# Patient Record
Sex: Male | Born: 1960
Health system: Southern US, Community
[De-identification: ages and names within clinical notes are randomized; demographics above are authoritative.]

## PROBLEM LIST (undated history)

## (undated) DIAGNOSIS — J45909 Unspecified asthma, uncomplicated: Secondary | ICD-10-CM

## (undated) DIAGNOSIS — I10 Essential (primary) hypertension: Secondary | ICD-10-CM

## (undated) DIAGNOSIS — R0600 Dyspnea, unspecified: Secondary | ICD-10-CM

## (undated) DIAGNOSIS — N529 Male erectile dysfunction, unspecified: Secondary | ICD-10-CM

## (undated) DIAGNOSIS — R079 Chest pain, unspecified: Secondary | ICD-10-CM

## (undated) DIAGNOSIS — K219 Gastro-esophageal reflux disease without esophagitis: Secondary | ICD-10-CM

## (undated) DIAGNOSIS — I4510 Unspecified right bundle-branch block: Secondary | ICD-10-CM

## (undated) DIAGNOSIS — R7303 Prediabetes: Secondary | ICD-10-CM

## (undated) DIAGNOSIS — Z87820 Personal history of traumatic brain injury: Secondary | ICD-10-CM

## (undated) DIAGNOSIS — M2550 Pain in unspecified joint: Secondary | ICD-10-CM

## (undated) DIAGNOSIS — I444 Left anterior fascicular block: Secondary | ICD-10-CM

## (undated) DIAGNOSIS — J309 Allergic rhinitis, unspecified: Secondary | ICD-10-CM

## (undated) DIAGNOSIS — F419 Anxiety disorder, unspecified: Secondary | ICD-10-CM

## (undated) DIAGNOSIS — G4730 Sleep apnea, unspecified: Secondary | ICD-10-CM

## (undated) DIAGNOSIS — E785 Hyperlipidemia, unspecified: Secondary | ICD-10-CM

## (undated) DIAGNOSIS — M1990 Unspecified osteoarthritis, unspecified site: Secondary | ICD-10-CM

## (undated) DIAGNOSIS — M549 Dorsalgia, unspecified: Secondary | ICD-10-CM

## (undated) DIAGNOSIS — I440 Atrioventricular block, first degree: Secondary | ICD-10-CM

## (undated) DIAGNOSIS — Z8659 Personal history of other mental and behavioral disorders: Secondary | ICD-10-CM

## (undated) DIAGNOSIS — Z87898 Personal history of other specified conditions: Secondary | ICD-10-CM

## (undated) DIAGNOSIS — C679 Malignant neoplasm of bladder, unspecified: Secondary | ICD-10-CM

## (undated) HISTORY — DX: Prediabetes: R73.03

## (undated) HISTORY — DX: Pain in unspecified joint: M25.50

## (undated) HISTORY — DX: Dorsalgia, unspecified: M54.9

## (undated) HISTORY — DX: Unspecified asthma, uncomplicated: J45.909

## (undated) HISTORY — DX: Chest pain, unspecified: R07.9

## (undated) HISTORY — DX: Dyspnea, unspecified: R06.00

## (undated) HISTORY — PX: TONSILLECTOMY AND ADENOIDECTOMY: SUR1326

## (undated) HISTORY — DX: Sleep apnea, unspecified: G47.30

## (undated) HISTORY — PX: OTHER SURGICAL HISTORY: SHX169

---

## 2009-03-18 ENCOUNTER — Encounter: Admission: RE | Admit: 2009-03-18 | Discharge: 2009-03-18 | Payer: Self-pay | Admitting: Orthopedic Surgery

## 2009-07-22 ENCOUNTER — Emergency Department (HOSPITAL_COMMUNITY): Admission: EM | Admit: 2009-07-22 | Discharge: 2009-07-22 | Payer: Self-pay | Admitting: Emergency Medicine

## 2010-07-28 ENCOUNTER — Emergency Department (HOSPITAL_COMMUNITY)
Admission: EM | Admit: 2010-07-28 | Discharge: 2010-07-29 | Disposition: A | Discharge status: Home / Self Care | Payer: BC Managed Care – PPO | Attending: Emergency Medicine | Admitting: Emergency Medicine

## 2010-07-28 DIAGNOSIS — R339 Retention of urine, unspecified: Secondary | ICD-10-CM | POA: Insufficient documentation

## 2010-07-28 DIAGNOSIS — S20229A Contusion of unspecified back wall of thorax, initial encounter: Secondary | ICD-10-CM | POA: Insufficient documentation

## 2010-07-28 DIAGNOSIS — Y929 Unspecified place or not applicable: Secondary | ICD-10-CM | POA: Insufficient documentation

## 2010-07-28 DIAGNOSIS — N390 Urinary tract infection, site not specified: Secondary | ICD-10-CM | POA: Insufficient documentation

## 2010-07-29 LAB — URINALYSIS, ROUTINE W REFLEX MICROSCOPIC
Leukocytes, UA: NEGATIVE
pH: 6 (ref 5.0–8.0)

## 2010-07-29 LAB — POCT I-STAT, CHEM 8
BUN: 18 mg/dL (ref 6–23)
Calcium, Ion: 1.14 mmol/L (ref 1.12–1.32)
Chloride: 108 mEq/L (ref 96–112)
Creatinine, Ser: 1 mg/dL (ref 0.4–1.5)
Glucose, Bld: 133 mg/dL — ABNORMAL HIGH (ref 70–99)
HCT: 44 % (ref 39.0–52.0)
Hemoglobin: 15 g/dL (ref 13.0–17.0)
TCO2: 23 mmol/L (ref 0–100)

## 2010-07-30 LAB — URINE CULTURE
Colony Count: NO GROWTH
Culture  Setup Time: 201203152222

## 2010-08-09 LAB — URINALYSIS, ROUTINE W REFLEX MICROSCOPIC
Glucose, UA: NEGATIVE mg/dL
Nitrite: NEGATIVE
Protein, ur: 100 mg/dL — AB
pH: 5 (ref 5.0–8.0)

## 2010-08-09 LAB — HEPATIC FUNCTION PANEL
Albumin: 4.8 g/dL (ref 3.5–5.2)
Alkaline Phosphatase: 71 U/L (ref 39–117)
Bilirubin, Direct: 0.2 mg/dL (ref 0.0–0.3)
Indirect Bilirubin: 0.6 mg/dL (ref 0.3–0.9)
Total Bilirubin: 0.8 mg/dL (ref 0.3–1.2)
Total Protein: 8.3 g/dL (ref 6.0–8.3)

## 2010-08-09 LAB — LIPASE, BLOOD: Lipase: 33 U/L (ref 11–59)

## 2010-08-09 LAB — DIFFERENTIAL
Basophils Absolute: 0 10*3/uL (ref 0.0–0.1)
Monocytes Relative: 7 % (ref 3–12)
Neutro Abs: 5.3 10*3/uL (ref 1.7–7.7)
Neutrophils Relative %: 65 % (ref 43–77)

## 2010-08-09 LAB — CBC
HCT: 42.5 % (ref 39.0–52.0)
MCV: 95.3 fL (ref 78.0–100.0)
Platelets: 175 10*3/uL (ref 150–400)
WBC: 8.2 10*3/uL (ref 4.0–10.5)

## 2010-08-09 LAB — POCT I-STAT, CHEM 8
BUN: 16 mg/dL (ref 6–23)
Calcium, Ion: 1.21 mmol/L (ref 1.12–1.32)
Potassium: 4.1 mEq/L (ref 3.5–5.1)
Sodium: 141 mEq/L (ref 135–145)

## 2010-08-09 LAB — URINE MICROSCOPIC-ADD ON

## 2011-09-19 ENCOUNTER — Inpatient Hospital Stay (HOSPITAL_COMMUNITY): Payer: BC Managed Care – PPO | Admitting: Certified Registered"

## 2011-09-19 ENCOUNTER — Inpatient Hospital Stay (HOSPITAL_COMMUNITY)
Admission: EM | Admit: 2011-09-19 | Discharge: 2011-09-22 | DRG: 331 | Disposition: A | Discharge status: Home / Self Care | Payer: BC Managed Care – PPO | Attending: General Surgery | Admitting: General Surgery

## 2011-09-19 ENCOUNTER — Emergency Department (HOSPITAL_COMMUNITY): Payer: BC Managed Care – PPO

## 2011-09-19 ENCOUNTER — Inpatient Hospital Stay (HOSPITAL_COMMUNITY): Payer: BC Managed Care – PPO

## 2011-09-19 ENCOUNTER — Encounter (HOSPITAL_COMMUNITY): Payer: Self-pay | Admitting: *Deleted

## 2011-09-19 ENCOUNTER — Encounter (HOSPITAL_COMMUNITY): Admission: EM | Disposition: A | Discharge status: Home / Self Care | Payer: Self-pay | Source: Home / Self Care

## 2011-09-19 ENCOUNTER — Encounter (HOSPITAL_COMMUNITY): Payer: Self-pay | Admitting: Certified Registered"

## 2011-09-19 DIAGNOSIS — Z96659 Presence of unspecified artificial knee joint: Secondary | ICD-10-CM

## 2011-09-19 DIAGNOSIS — K929 Disease of digestive system, unspecified: Secondary | ICD-10-CM | POA: Diagnosis not present

## 2011-09-19 DIAGNOSIS — K219 Gastro-esophageal reflux disease without esophagitis: Secondary | ICD-10-CM | POA: Diagnosis present

## 2011-09-19 DIAGNOSIS — S3720XA Unspecified injury of bladder, initial encounter: Principal | ICD-10-CM | POA: Diagnosis present

## 2011-09-19 DIAGNOSIS — S058X9A Other injuries of unspecified eye and orbit, initial encounter: Secondary | ICD-10-CM | POA: Diagnosis present

## 2011-09-19 DIAGNOSIS — L719 Rosacea, unspecified: Secondary | ICD-10-CM | POA: Diagnosis present

## 2011-09-19 DIAGNOSIS — F10229 Alcohol dependence with intoxication, unspecified: Secondary | ICD-10-CM | POA: Diagnosis present

## 2011-09-19 DIAGNOSIS — S0510XA Contusion of eyeball and orbital tissues, unspecified eye, initial encounter: Secondary | ICD-10-CM | POA: Diagnosis present

## 2011-09-19 DIAGNOSIS — R059 Cough, unspecified: Secondary | ICD-10-CM | POA: Diagnosis not present

## 2011-09-19 DIAGNOSIS — S2239XA Fracture of one rib, unspecified side, initial encounter for closed fracture: Secondary | ICD-10-CM

## 2011-09-19 DIAGNOSIS — S2232XA Fracture of one rib, left side, initial encounter for closed fracture: Secondary | ICD-10-CM | POA: Diagnosis present

## 2011-09-19 DIAGNOSIS — S3729XA Other injury of bladder, initial encounter: Secondary | ICD-10-CM | POA: Diagnosis present

## 2011-09-19 DIAGNOSIS — R05 Cough: Secondary | ICD-10-CM | POA: Diagnosis not present

## 2011-09-19 DIAGNOSIS — S0230XA Fracture of orbital floor, unspecified side, initial encounter for closed fracture: Secondary | ICD-10-CM | POA: Diagnosis present

## 2011-09-19 DIAGNOSIS — S0285XA Fracture of orbit, unspecified, initial encounter for closed fracture: Secondary | ICD-10-CM | POA: Diagnosis present

## 2011-09-19 DIAGNOSIS — F1010 Alcohol abuse, uncomplicated: Secondary | ICD-10-CM | POA: Diagnosis present

## 2011-09-19 DIAGNOSIS — K560 Paralytic ileus: Secondary | ICD-10-CM | POA: Diagnosis not present

## 2011-09-19 DIAGNOSIS — S3730XA Unspecified injury of urethra, initial encounter: Principal | ICD-10-CM | POA: Diagnosis present

## 2011-09-19 DIAGNOSIS — E785 Hyperlipidemia, unspecified: Secondary | ICD-10-CM | POA: Diagnosis present

## 2011-09-19 DIAGNOSIS — D62 Acute posthemorrhagic anemia: Secondary | ICD-10-CM | POA: Diagnosis not present

## 2011-09-19 DIAGNOSIS — N3289 Other specified disorders of bladder: Secondary | ICD-10-CM

## 2011-09-19 DIAGNOSIS — S060X9A Concussion with loss of consciousness of unspecified duration, initial encounter: Secondary | ICD-10-CM

## 2011-09-19 DIAGNOSIS — J45909 Unspecified asthma, uncomplicated: Secondary | ICD-10-CM | POA: Diagnosis present

## 2011-09-19 DIAGNOSIS — S0292XA Unspecified fracture of facial bones, initial encounter for closed fracture: Secondary | ICD-10-CM

## 2011-09-19 HISTORY — PX: CYSTOSCOPY: SHX5120

## 2011-09-19 LAB — POCT I-STAT, CHEM 8
Calcium, Ion: 1.16 mmol/L (ref 1.12–1.32)
Chloride: 103 mEq/L (ref 96–112)
Creatinine, Ser: 1.5 mg/dL — ABNORMAL HIGH (ref 0.50–1.35)
Hemoglobin: 17.3 g/dL — ABNORMAL HIGH (ref 13.0–17.0)

## 2011-09-19 LAB — URINE MICROSCOPIC-ADD ON

## 2011-09-19 LAB — URINALYSIS, ROUTINE W REFLEX MICROSCOPIC
Protein, ur: 30 mg/dL — AB
Specific Gravity, Urine: 1.045 — ABNORMAL HIGH (ref 1.005–1.030)
pH: 5 (ref 5.0–8.0)

## 2011-09-19 LAB — ETHANOL: Alcohol, Ethyl (B): 188 mg/dL — ABNORMAL HIGH (ref 0–11)

## 2011-09-19 LAB — MRSA PCR SCREENING: MRSA by PCR: NEGATIVE

## 2011-09-19 SURGERY — CYSTOSCOPY
Anesthesia: General | Site: Bladder | Wound class: Clean Contaminated

## 2011-09-19 MED ORDER — LORAZEPAM 2 MG/ML IJ SOLN: 1.0000 mg | Freq: Four times a day (QID) | INTRAMUSCULAR | Status: AC | PRN

## 2011-09-19 MED ORDER — IOHEXOL 300 MG/ML  SOLN
100.0000 mL | Freq: Once | INTRAMUSCULAR | Status: AC | PRN
  Administered 2011-09-19: 100 mL via INTRAVENOUS

## 2011-09-19 MED ORDER — GLYCOPYRROLATE 0.2 MG/ML IJ SOLN
INTRAMUSCULAR | Status: DC | PRN
  Administered 2011-09-19: .8 mg via INTRAVENOUS

## 2011-09-19 MED ORDER — SUCCINYLCHOLINE CHLORIDE 20 MG/ML IJ SOLN
INTRAMUSCULAR | Status: DC | PRN
  Administered 2011-09-19: 120 mg via INTRAVENOUS

## 2011-09-19 MED ORDER — MORPHINE SULFATE (PF) 1 MG/ML IV SOLN
INTRAVENOUS | Status: DC
  Administered 2011-09-19: 22.5 mg via INTRAVENOUS
  Administered 2011-09-19 (×2): via INTRAVENOUS
  Administered 2011-09-20: 31 mg via INTRAVENOUS
  Administered 2011-09-20 (×3): via INTRAVENOUS
  Administered 2011-09-20: 6 mg via INTRAVENOUS
  Administered 2011-09-21: 16 mg via INTRAVENOUS
  Administered 2011-09-21: 16.4 mg via INTRAVENOUS
  Administered 2011-09-21: 06:00:00 via INTRAVENOUS
  Filled 2011-09-19 (×7): qty 25

## 2011-09-19 MED ORDER — LORAZEPAM 1 MG PO TABS: 0.0000 mg | ORAL_TABLET | Freq: Two times a day (BID) | ORAL | Status: DC

## 2011-09-19 MED ORDER — CEFAZOLIN SODIUM-DEXTROSE 2-3 GM-% IV SOLR
2.0000 g | Freq: Three times a day (TID) | INTRAVENOUS | Status: DC
  Administered 2011-09-19 – 2011-09-21 (×4): 2 g via INTRAVENOUS
  Filled 2011-09-19 (×8): qty 50

## 2011-09-19 MED ORDER — PHENYLEPHRINE HCL 10 MG/ML IJ SOLN
INTRAMUSCULAR | Status: DC | PRN
  Administered 2011-09-19: 80 ug via INTRAVENOUS
  Administered 2011-09-19 (×2): 40 ug via INTRAVENOUS
  Administered 2011-09-19 (×3): 80 ug via INTRAVENOUS

## 2011-09-19 MED ORDER — SUFENTANIL CITRATE 50 MCG/ML IV SOLN
INTRAVENOUS | Status: DC | PRN
  Administered 2011-09-19: 15 ug via INTRAVENOUS
  Administered 2011-09-19: 10 ug via INTRAVENOUS
  Administered 2011-09-19 (×3): 5 ug via INTRAVENOUS
  Administered 2011-09-19: 10 ug via INTRAVENOUS

## 2011-09-19 MED ORDER — FOLIC ACID 1 MG PO TABS
1.0000 mg | ORAL_TABLET | Freq: Every day | ORAL | Status: DC
  Administered 2011-09-19 – 2011-09-22 (×4): 1 mg via ORAL
  Filled 2011-09-19 (×4): qty 1

## 2011-09-19 MED ORDER — LIDOCAINE HCL (CARDIAC) 20 MG/ML IV SOLN
INTRAVENOUS | Status: DC | PRN
  Administered 2011-09-19: 40 mg via INTRAVENOUS

## 2011-09-19 MED ORDER — LACTATED RINGERS IV SOLN
INTRAVENOUS | Status: DC | PRN
  Administered 2011-09-19 (×4): via INTRAVENOUS

## 2011-09-19 MED ORDER — MIDAZOLAM HCL 5 MG/5ML IJ SOLN
INTRAMUSCULAR | Status: DC | PRN
  Administered 2011-09-19: 2 mg via INTRAVENOUS

## 2011-09-19 MED ORDER — LORAZEPAM 1 MG PO TABS
1.0000 mg | ORAL_TABLET | Freq: Four times a day (QID) | ORAL | Status: AC | PRN
  Filled 2011-09-19: qty 1

## 2011-09-19 MED ORDER — THIAMINE HCL 100 MG/ML IJ SOLN
100.0000 mg | Freq: Every day | INTRAMUSCULAR | Status: DC
  Filled 2011-09-19 (×3): qty 1

## 2011-09-19 MED ORDER — FENTANYL CITRATE 0.05 MG/ML IJ SOLN
100.0000 ug | Freq: Once | INTRAMUSCULAR | Status: AC
  Administered 2011-09-19: 100 ug via INTRAVENOUS

## 2011-09-19 MED ORDER — HYDROMORPHONE HCL PF 1 MG/ML IJ SOLN
0.2500 mg | INTRAMUSCULAR | Status: DC | PRN
  Administered 2011-09-19: 0.5 mg via INTRAVENOUS

## 2011-09-19 MED ORDER — HETASTARCH-ELECTROLYTES 6 % IV SOLN
INTRAVENOUS | Status: DC | PRN
  Administered 2011-09-19: 16:00:00 via INTRAVENOUS

## 2011-09-19 MED ORDER — DIPHENHYDRAMINE HCL 50 MG/ML IJ SOLN: 12.5000 mg | Freq: Four times a day (QID) | INTRAMUSCULAR | Status: DC | PRN

## 2011-09-19 MED ORDER — ONDANSETRON HCL 4 MG/2ML IJ SOLN: 4.0000 mg | Freq: Four times a day (QID) | INTRAMUSCULAR | Status: DC | PRN

## 2011-09-19 MED ORDER — ADULT MULTIVITAMIN W/MINERALS CH
1.0000 | ORAL_TABLET | Freq: Every day | ORAL | Status: DC
  Administered 2011-09-19 – 2011-09-22 (×4): 1 via ORAL
  Filled 2011-09-19 (×4): qty 1

## 2011-09-19 MED ORDER — ROCURONIUM BROMIDE 100 MG/10ML IV SOLN
INTRAVENOUS | Status: DC | PRN
  Administered 2011-09-19: 20 mg via INTRAVENOUS
  Administered 2011-09-19: 10 mg via INTRAVENOUS
  Administered 2011-09-19: 50 mg via INTRAVENOUS
  Administered 2011-09-19: 10 mg via INTRAVENOUS

## 2011-09-19 MED ORDER — POTASSIUM CHLORIDE IN NACL 20-0.45 MEQ/L-% IV SOLN
INTRAVENOUS | Status: DC
  Administered 2011-09-19: 125 mL via INTRAVENOUS
  Administered 2011-09-20 (×2): via INTRAVENOUS
  Filled 2011-09-19 (×9): qty 1000

## 2011-09-19 MED ORDER — FENTANYL CITRATE 0.05 MG/ML IJ SOLN
100.0000 ug | Freq: Once | INTRAMUSCULAR | Status: AC
  Administered 2011-09-19: 100 ug via INTRAVENOUS
  Filled 2011-09-19: qty 2

## 2011-09-19 MED ORDER — ONDANSETRON HCL 4 MG/2ML IJ SOLN
INTRAMUSCULAR | Status: DC | PRN
  Administered 2011-09-19: 4 mg via INTRAVENOUS

## 2011-09-19 MED ORDER — PANTOPRAZOLE SODIUM 40 MG PO TBEC
40.0000 mg | DELAYED_RELEASE_TABLET | Freq: Every day | ORAL | Status: DC
  Administered 2011-09-19 – 2011-09-22 (×4): 40 mg via ORAL
  Filled 2011-09-19 (×4): qty 1

## 2011-09-19 MED ORDER — HYDROMORPHONE HCL PF 1 MG/ML IJ SOLN
0.5000 mg | INTRAMUSCULAR | Status: DC | PRN
  Filled 2011-09-19: qty 1

## 2011-09-19 MED ORDER — PROPOFOL 10 MG/ML IV EMUL
INTRAVENOUS | Status: DC | PRN
  Administered 2011-09-19: 200 mg via INTRAVENOUS

## 2011-09-19 MED ORDER — SODIUM CHLORIDE 0.9 % IJ SOLN: 9.0000 mL | INTRAMUSCULAR | Status: DC | PRN

## 2011-09-19 MED ORDER — VITAMIN B-1 100 MG PO TABS
100.0000 mg | ORAL_TABLET | Freq: Every day | ORAL | Status: DC
  Administered 2011-09-19 – 2011-09-22 (×4): 100 mg via ORAL
  Filled 2011-09-19 (×4): qty 1

## 2011-09-19 MED ORDER — MORPHINE SULFATE 4 MG/ML IJ SOLN: 0.0500 mg/kg | INTRAMUSCULAR | Status: DC | PRN

## 2011-09-19 MED ORDER — LORAZEPAM 1 MG PO TABS
0.0000 mg | ORAL_TABLET | Freq: Four times a day (QID) | ORAL | Status: AC
  Administered 2011-09-20: 1 mg via ORAL

## 2011-09-19 MED ORDER — SODIUM CHLORIDE 0.9 % IR SOLN
Status: DC | PRN
  Administered 2011-09-19: 3000 mL

## 2011-09-19 MED ORDER — 0.9 % SODIUM CHLORIDE (POUR BTL) OPTIME
TOPICAL | Status: DC | PRN
  Administered 2011-09-19: 1000 mL

## 2011-09-19 MED ORDER — FENTANYL CITRATE 0.05 MG/ML IJ SOLN
INTRAMUSCULAR | Status: AC
  Filled 2011-09-19: qty 2

## 2011-09-19 MED ORDER — EPHEDRINE SULFATE 50 MG/ML IJ SOLN
INTRAMUSCULAR | Status: DC | PRN
  Administered 2011-09-19: 5 mg via INTRAVENOUS
  Administered 2011-09-19: 10 mg via INTRAVENOUS
  Administered 2011-09-19: 5 mg via INTRAVENOUS

## 2011-09-19 MED ORDER — ONDANSETRON HCL 4 MG/2ML IJ SOLN: 4.0000 mg | Freq: Once | INTRAMUSCULAR | Status: DC | PRN

## 2011-09-19 MED ORDER — DOCUSATE SODIUM 100 MG PO CAPS
100.0000 mg | ORAL_CAPSULE | Freq: Two times a day (BID) | ORAL | Status: DC
  Administered 2011-09-19 – 2011-09-22 (×6): 100 mg via ORAL
  Filled 2011-09-19 (×6): qty 1

## 2011-09-19 MED ORDER — PANTOPRAZOLE SODIUM 40 MG IV SOLR
40.0000 mg | Freq: Every day | INTRAVENOUS | Status: DC
  Filled 2011-09-19 (×2): qty 40

## 2011-09-19 MED ORDER — LEVOFLOXACIN IN D5W 500 MG/100ML IV SOLN
500.0000 mg | INTRAVENOUS | Status: AC
  Administered 2011-09-19: 500 mg via INTRAVENOUS
  Filled 2011-09-19 (×2): qty 100

## 2011-09-19 MED ORDER — NEOSTIGMINE METHYLSULFATE 1 MG/ML IJ SOLN
INTRAMUSCULAR | Status: DC | PRN
  Administered 2011-09-19: 5 mg via INTRAVENOUS

## 2011-09-19 MED ORDER — NALOXONE HCL 0.4 MG/ML IJ SOLN: 0.4000 mg | INTRAMUSCULAR | Status: DC | PRN

## 2011-09-19 MED ORDER — CEFAZOLIN SODIUM 1-5 GM-% IV SOLN
INTRAVENOUS | Status: DC | PRN
  Administered 2011-09-19: 2 g via INTRAVENOUS

## 2011-09-19 MED ORDER — DIPHENHYDRAMINE HCL 12.5 MG/5ML PO ELIX
12.5000 mg | ORAL_SOLUTION | Freq: Four times a day (QID) | ORAL | Status: DC | PRN
  Filled 2011-09-19: qty 5

## 2011-09-19 MED ORDER — ALBUTEROL SULFATE HFA 108 (90 BASE) MCG/ACT IN AERS
2.0000 | INHALATION_SPRAY | RESPIRATORY_TRACT | Status: DC | PRN
  Administered 2011-09-21: 2 via RESPIRATORY_TRACT
  Filled 2011-09-19: qty 6.7

## 2011-09-19 SURGICAL SUPPLY — 50 items
BAG URINE DRAINAGE (UROLOGICAL SUPPLIES) ×3 IMPLANT
BAG URINE LEG 500ML (DRAIN) ×2 IMPLANT
BAG URO CATCHER STRL LF (DRAPE) ×2 IMPLANT
BLADE SURG 15 STRL LF DISP TIS (BLADE) ×1 IMPLANT
BLADE SURG 15 STRL SS (BLADE) ×2
CATH FOLEY 2WAY SLVR  5CC 18FR (CATHETERS) ×1
CATH FOLEY 2WAY SLVR  5CC 20FR (CATHETERS) ×2
CATH FOLEY 2WAY SLVR 5CC 18FR (CATHETERS) IMPLANT
CATH FOLEY 2WAY SLVR 5CC 20FR (CATHETERS) IMPLANT
CATH MUSHROOM 22FR (CATHETERS) ×1 IMPLANT
CATH ROBINSON RED A/P 18FR (CATHETERS) ×1 IMPLANT
CLOTH BEACON ORANGE TIMEOUT ST (SAFETY) ×3 IMPLANT
COVER SURGICAL LIGHT HANDLE (MISCELLANEOUS) ×4 IMPLANT
DRAIN CHANNEL 19F RND (DRAIN) ×1 IMPLANT
DRAPE CAMERA CLOSED 9X96 (DRAPES) ×2 IMPLANT
DRAPE LAPAROSCOPIC ABDOMINAL (DRAPES) ×1 IMPLANT
DRSG TEGADERM 4X4.75 (GAUZE/BANDAGES/DRESSINGS) ×2 IMPLANT
ELECT REM PT RETURN 9FT ADLT (ELECTROSURGICAL) ×2
ELECTRODE REM PT RTRN 9FT ADLT (ELECTROSURGICAL) ×1 IMPLANT
EVACUATOR SILICONE 100CC (DRAIN) ×1 IMPLANT
GLOVE BIOGEL PI IND STRL 7.5 (GLOVE) ×1 IMPLANT
GLOVE BIOGEL PI INDICATOR 7.5 (GLOVE) ×1
GLOVE ECLIPSE 7.5 STRL STRAW (GLOVE) ×2 IMPLANT
GLOVE SURG SS PI 8.0 STRL IVOR (GLOVE) ×1 IMPLANT
GOWN PREVENTION PLUS XLARGE (GOWN DISPOSABLE) ×2 IMPLANT
GOWN STRL NON-REIN LRG LVL3 (GOWN DISPOSABLE) ×2 IMPLANT
GOWN STRL REIN XL XLG (GOWN DISPOSABLE) ×4 IMPLANT
KIT SUPRAPUBIC CATH (MISCELLANEOUS) ×1 IMPLANT
MANIFOLD NEPTUNE II (INSTRUMENTS) ×2 IMPLANT
NEEDLE HYPO 22GX1.5 SAFETY (NEEDLE) IMPLANT
NS IRRIG 1000ML POUR BTL (IV SOLUTION) ×2 IMPLANT
PACK CYSTO (CUSTOM PROCEDURE TRAY) ×2 IMPLANT
PENCIL BUTTON HOLSTER BLD 10FT (ELECTRODE) IMPLANT
PLUG CATH AND CAP STER (CATHETERS) IMPLANT
SPONGE GAUZE 4X4 12PLY (GAUZE/BANDAGES/DRESSINGS) ×1 IMPLANT
STAPLER VISISTAT 35W (STAPLE) ×1 IMPLANT
SUT CHROMIC 2 0 UR 5 27 (SUTURE) ×2 IMPLANT
SUT ETHILON 3 0 FSL (SUTURE) ×2 IMPLANT
SUT PDS AB 1 CTX 36 (SUTURE) ×2 IMPLANT
SUT SILK 2 0 (SUTURE) ×2
SUT SILK 2 0 FS (SUTURE) ×2 IMPLANT
SUT SILK 2-0 18XBRD TIE 12 (SUTURE) ×1 IMPLANT
SUT VIC AB 2-0 UR5 27 (SUTURE) ×8 IMPLANT
SUT VIC AB 3-0 SH 27 (SUTURE) ×2
SUT VIC AB 3-0 SH 27XBRD (SUTURE) IMPLANT
SYRINGE IRR TOOMEY STRL 70CC (SYRINGE) ×1 IMPLANT
TAPE CLOTH SURG 6X10 WHT LF (GAUZE/BANDAGES/DRESSINGS) ×1 IMPLANT
TOWEL OR 17X26 10 PK STRL BLUE (TOWEL DISPOSABLE) ×2 IMPLANT
TUBE CONNECTING 12X1/4 (SUCTIONS) ×2 IMPLANT
WATER STERILE IRR 3000ML UROMA (IV SOLUTION) ×2 IMPLANT
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
## 2011-09-19 NOTE — ED Notes (Signed)
MD at bedside. 
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
## 2011-09-19 NOTE — Brief Op Note (Signed)
09/19/2011  7:04 PM  PATIENT:  Christian [name]  51 y.o. male  PRE-OPERATIVE DIAGNOSIS:  Intraperitoneal bladder rupture  POST-OPERATIVE DIAGNOSIS:  same  PROCEDURE:  Procedure(s) (LRB): CYSTOSCOPY (N/A) INSERTION OF SUPRAPUBIC CATHETER (N/A) Open repair of bladder rupture.  SURGEON:  Surgeon(s) and Role:    * Milford Cage, MD - Primary  PHYSICIAN ASSISTANT:   ASSISTANTS: none   ANESTHESIA:   general  EBL:   20cc  BLOOD ADMINISTERED:none  DRAINS: (1) Jackson-Pratt drain(s) with closed bulb suction in the RLQ, Urinary Catheter (Foley) and Urinary Catheter (Suprapubic)   LOCAL MEDICATIONS USED:  NONE  SPECIMEN:  No Specimen  DISPOSITION OF SPECIMEN:  N/A  COUNTS:  YES  TOURNIQUET:  * No tourniquets in log *  DICTATION: .Other Dictation: Dictation Number [id number]  PLAN OF CARE: Admit to inpatient   PATIENT DISPOSITION:  PACU - hemodynamically stable.   Delay start of Pharmacological VTE agent (>24hrs) due to surgical blood loss or risk of bleeding: no
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
## 2011-09-19 NOTE — ED Notes (Signed)
Nothing acute seen on bedside ultrasound by Dr. Radford Pax.  LSB removed with c-spine maintained..  Pt remains in c-collar.

## 2011-09-19 NOTE — Transfer of Care (Signed)
Immediate Anesthesia Transfer of Care Note  Patient: Christian [name]  Procedure(s) Performed: Procedure(s) (LRB): CYSTOSCOPY (N/A) INSERTION OF SUPRAPUBIC CATHETER (N/A)  Patient Location: PACU  Anesthesia Type: General  Level of Consciousness: awake, alert , oriented and patient cooperative  Airway & Oxygen Therapy: Patient Spontanous Breathing and Patient connected to face mask oxygen  Post-op Assessment: Report given to PACU RN and Post -op Vital signs reviewed and stable  Post vital signs: Reviewed and stable  Complications: No apparent anesthesia complications

## 2011-09-19 NOTE — Anesthesia Preprocedure Evaluation (Addendum)
Anesthesia Evaluation  Patient identified by MRN, date of birth, ID band Patient awake    Reviewed: Allergy & Precautions, H&P , NPO status , Patient's Chart, lab work & pertinent test results, reviewed documented beta blocker date and time   Airway Mallampati: I TM Distance: >3 FB     Dental  (+) Teeth Intact   Pulmonary asthma ,          Cardiovascular     Neuro/Psych    GI/Hepatic GERD-  Controlled,  Endo/Other    Renal/GU      Musculoskeletal   Abdominal   Peds  Hematology   Anesthesia Other Findings   Reproductive/Obstetrics                           Anesthesia Physical Anesthesia Plan  ASA: II  Anesthesia Plan: General   Post-op Pain Management:    Induction: Intravenous, Rapid sequence and Cricoid pressure planned  Airway Management Planned: Oral ETT  Additional Equipment:   Intra-op Plan:   Post-operative Plan: Extubation in OR  Informed Consent: I have reviewed the patients History and Physical, chart, labs and discussed the procedure including the risks, benefits and alternatives for the proposed anesthesia with the patient or authorized representative who has indicated his/her understanding and acceptance.   Dental advisory given  Plan Discussed with: CRNA and Surgeon  Anesthesia Plan Comments:        Anesthesia Quick Evaluation

## 2011-09-19 NOTE — Anesthesia Postprocedure Evaluation (Signed)
  Anesthesia Post-op Note  Patient: Christian [name]  Procedure(s) Performed: Procedure(s) (LRB): CYSTOSCOPY (N/A) INSERTION OF SUPRAPUBIC CATHETER (N/A)  Patient Location: PACU  Anesthesia Type: General  Level of Consciousness: awake  Airway and Oxygen Therapy: Patient Spontanous Breathing  Post-op Pain: mild  Post-op Assessment: Post-op Vital signs reviewed, Patient's Cardiovascular Status Stable, Respiratory Function Stable, Patent Airway, No signs of Nausea or vomiting and Pain level controlled  Post-op Vital Signs: stable  Complications: No apparent anesthesia complications
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
## 2011-09-19 NOTE — Preoperative (Signed)
Beta Blockers   Reason not to administer Beta Blockers:Not Applicable 

## 2011-09-19 NOTE — Anesthesia Procedure Notes (Signed)
Procedure Name: Intubation Date/Time: 09/19/2011 3:24 PM Performed by: Glendora Score A Pre-anesthesia Checklist: Patient identified, Emergency Drugs available, Suction available and Patient being monitored Patient Re-evaluated:Patient Re-evaluated prior to inductionOxygen Delivery Method: Circle system utilized Preoxygenation: Pre-oxygenation with 100% oxygen Intubation Type: IV induction Ventilation: Mask ventilation without difficulty and Oral airway inserted - appropriate to patient size Grade View: Grade I Tube type: Oral Tube size: 8.0 mm Number of attempts: 1 Airway Equipment and Method: Stylet and Video-laryngoscopy Placement Confirmation: ETT inserted through vocal cords under direct vision,  positive ETCO2 and breath sounds checked- equal and bilateral Secured at: 24 cm Tube secured with: Tape Dental Injury: Teeth and Oropharynx as per pre-operative assessment  Comments: Modified RSI

## 2011-09-19 NOTE — H&P (Signed)
Intraperitoneal bladder rupture  This patient has been seen and I agree with the findings and treatment plan.  Marta Lamas. Gae Bon, MD, FACS [phone] (pager) [phone] (direct pager) Trauma Surgeon

## 2011-09-20 ENCOUNTER — Encounter (HOSPITAL_COMMUNITY): Payer: Self-pay | Admitting: *Deleted

## 2011-09-20 ENCOUNTER — Inpatient Hospital Stay (HOSPITAL_COMMUNITY): Payer: BC Managed Care – PPO

## 2011-09-20 LAB — CBC
MCH: 32.4 pg (ref 26.0–34.0)
MCV: 95.8 fL (ref 78.0–100.0)
Platelets: 125 10*3/uL — ABNORMAL LOW (ref 150–400)
RBC: 4.01 MIL/uL — ABNORMAL LOW (ref 4.22–5.81)

## 2011-09-20 LAB — BASIC METABOLIC PANEL
BUN: 9 mg/dL (ref 6–23)
CO2: 28 mEq/L (ref 19–32)
Calcium: 8.5 mg/dL (ref 8.4–10.5)
Creatinine, Ser: 0.85 mg/dL (ref 0.50–1.35)
Glucose, Bld: 96 mg/dL (ref 70–99)

## 2011-09-20 MED ORDER — ENOXAPARIN SODIUM 40 MG/0.4ML ~~LOC~~ SOLN
40.0000 mg | SUBCUTANEOUS | Status: DC
  Administered 2011-09-20 – 2011-09-21 (×2): 40 mg via SUBCUTANEOUS
  Filled 2011-09-20 (×3): qty 0.4

## 2011-09-20 MED ORDER — IPRATROPIUM-ALBUTEROL 18-103 MCG/ACT IN AERO
2.0000 | INHALATION_SPRAY | Freq: Four times a day (QID) | RESPIRATORY_TRACT | Status: DC | PRN
  Administered 2011-09-21: 2 via RESPIRATORY_TRACT
  Filled 2011-09-20: qty 14.7
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
## 2011-09-20 NOTE — Progress Notes (Signed)
09/20/2011 9:08 AM  Christian [name] [id number]    Return for physical examination and completion of history.  Hx:  Pt denies visual difficulty or any diplopia.  No pain with ROM eyes.  No hearing diff.  No malocclusion or pain with jaw motion.  PE:  Stocky middle aged wm.  Alert and appropriate.  Some RIGHT peri-orbital edema and ecchmosis.  Pupils miotic, probably secondary to narcotics.  EOM intact OU.  No gross dysconjugate gaze.  No subjective diplopia.  Bony facial contours intact and non-tender.  Mild facial excoriations.  Apparent mild anterior open bite which he claims is his baseline.  IMPRESSION:  Medial RIGHT orbital blow out fx.  PLAN:  No repair required.  Ice. Elevation.  Analgesia. Antibiosis.  Ophth consult.  No nose blowing x 2 weeks.  Recheck my office 1 wk.  See my note from yesterday.  Flo Shanks

## 2011-09-20 NOTE — Progress Notes (Signed)
UR complete 

## 2011-09-20 NOTE — Progress Notes (Signed)
Urology Progress Note  Subjective:     No acute urologic events overnight. Catheters draining well. Patient complains of abdominal pain which is decreased compared to yesterday.     ROS: Negative: SOB  Objective:  Patient Vitals for the past 24 hrs:  BP Temp Temp src Pulse Resp SpO2  09/20/11 0600 122/65 mmHg - - 85  20  -  09/20/11 0500 114/56 mmHg - - 77  15  100 %  09/20/11 0400 121/59 mmHg 97.6 F (36.4 C) Oral 81  16  100 %  09/20/11 0300 122/61 mmHg - - 82  14  100 %  09/20/11 0200 115/56 mmHg - - 80  13  100 %  09/20/11 0100 124/56 mmHg - - 93  16  100 %  09/20/11 0000 129/52 mmHg 98.2 F (36.8 C) Oral 99  15  99 %  09/19/11 2300 111/52 mmHg - - 104  17  92 %  09/19/11 2230 117/81 mmHg - - 106  20  93 %  09/19/11 2200 156/72 mmHg - - 102  21  94 %  09/19/11 2130 165/74 mmHg - - 101  25  98 %  09/19/11 2100 175/89 mmHg - - 100  24  100 %  09/19/11 1953 - 97.6 F (36.4 C) - 84  22  100 %  09/19/11 1952 - - - 85  21  100 %  09/19/11 1951 - - - 86  21  100 %  09/19/11 1950 - - - 86  23  100 %  09/19/11 1931 - - - 80  21  100 %  09/19/11 1930 - - - 82  21  100 %  09/19/11 1929 - - - 82  21  100 %  09/19/11 1928 - - - 85  21  100 %  09/19/11 1927 - - - 84  19  100 %  09/19/11 1926 - - - 85  21  100 %  09/19/11 1925 - - - 93  20  100 %  09/19/11 1924 - - - 91  19  100 %  09/19/11 1923 - - - 83  16  100 %  09/19/11 1922 - - - 89  19  100 %  09/19/11 1921 - - - 85  18  100 %  09/19/11 1920 - - - 91  17  100 %  09/19/11 1919 - - - 95  13  100 %  09/19/11 1918 - - - 92  11  100 %  09/19/11 1917 169/89 mmHg - - 92  19  100 %  09/19/11 1916 - - - - 21  -  09/19/11 1915 - 98.3 F (36.8 C) - - - -  09/19/11 1300 142/78 mmHg - - 82  - 100 %  09/19/11 1059 159/110 mmHg 97.5 F (36.4 C) Oral 68  18  100 %  09/19/11 0858 144/77 mmHg - - 76  18  98 %  09/19/11 0853 144/77 mmHg - - 78  18  100 %    Physical Exam: General:  No acute distress, awake Cardiovascular:    [x]    S1/S2 present, RRR  []   Irregularly irregular Chest:  CTA-B Abdomen:               []  Soft, appropriately TTP  []  Soft, NTTP  [x]  Soft, appropriately TTP, incision(s) dressed, JP with serosanguinous fluid  Genitourinary: Foley and SP tube in place draining clear pink urine.  I/O last 3 completed shifts: In: 3500 [I.V.:3000; IV Piggyback:500] Out: 530 [Urine:510; Blood:20]  Output since surgery: Foley: 815cc SP: 370cc JP: 80cc  Recent Labs  Basename 09/20/11 0442 09/19/11 0931   HGB 13.0 17.3*   WBC 9.3 --   PLT 125* --    Recent Labs  Basename 09/20/11 0442 09/19/11 0931   NA 137 140   K 4.1 4.4   CL 102 103   CO2 28 --   BUN 9 7   CREATININE 0.85 1.50*   CALCIUM 8.5 --   GFRNONAA >90 --   GFRAA >90 --     No results found for this basename: PT:2,INR:2,APTT:2 in the last 72 hours   No components found with this basename: ABG:2    Length of stay: 1 days.  Assessment: Intraperitoneal bladder rupture POD#1 open cystorraphy with SP tube placement. Plan: -Continue SP & foley catheter to drainage. Continue JP drain. -Drained 1 liter of fluid out of peritoneal cavity yesterday through cystotomy. He may develop peritonitis due to the urine that was in his abdomen; will keep an eye out for that. -Will likely need to be discharged home with SP tube & foley in place. -Will follow. No indication for further surgery at this time.   Natalia Leatherwood, MD [phone]
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
## 2011-09-21 ENCOUNTER — Inpatient Hospital Stay (HOSPITAL_COMMUNITY): Payer: BC Managed Care – PPO

## 2011-09-21 DIAGNOSIS — S058X9A Other injuries of unspecified eye and orbit, initial encounter: Secondary | ICD-10-CM | POA: Diagnosis present

## 2011-09-21 LAB — CBC
HCT: 36.7 % — ABNORMAL LOW (ref 39.0–52.0)
MCH: 32.1 pg (ref 26.0–34.0)
MCHC: 33.2 g/dL (ref 30.0–36.0)
MCV: 96.6 fL (ref 78.0–100.0)
RDW: 13.4 % (ref 11.5–15.5)

## 2011-09-21 LAB — BASIC METABOLIC PANEL
BUN: 6 mg/dL (ref 6–23)
Calcium: 8.7 mg/dL (ref 8.4–10.5)
Creatinine, Ser: 0.7 mg/dL (ref 0.50–1.35)
GFR calc Af Amer: >90 mL/min (ref >90)
GFR calc non Af Amer: >90 mL/min (ref >90)

## 2011-09-21 LAB — EXPECTORATED SPUTUM ASSESSMENT W GRAM STAIN, RFLX TO RESP C

## 2011-09-21 MED ORDER — OXYCODONE HCL 5 MG PO TABS
5.0000 mg | ORAL_TABLET | ORAL | Status: DC | PRN
  Administered 2011-09-21 – 2011-09-22 (×6): 15 mg via ORAL
  Filled 2011-09-21 (×4): qty 3
  Filled 2011-09-21: qty 2
  Filled 2011-09-21: qty 1
  Filled 2011-09-21: qty 3

## 2011-09-21 MED ORDER — ENOXAPARIN SODIUM 30 MG/0.3ML ~~LOC~~ SOLN
30.0000 mg | Freq: Two times a day (BID) | SUBCUTANEOUS | Status: DC
  Administered 2011-09-21 – 2011-09-22 (×2): 30 mg via SUBCUTANEOUS
  Filled 2011-09-21 (×3): qty 0.3

## 2011-09-21 MED ORDER — HYDROMORPHONE HCL PF 1 MG/ML IJ SOLN: 0.5000 mg | INTRAMUSCULAR | Status: DC | PRN

## 2011-09-21 MED ORDER — NAPROXEN 500 MG PO TABS
500.0000 mg | ORAL_TABLET | Freq: Two times a day (BID) | ORAL | Status: DC
  Administered 2011-09-21 – 2011-09-22 (×2): 500 mg via ORAL
  Filled 2011-09-21 (×4): qty 1

## 2011-09-21 MED ORDER — TRAMADOL HCL 50 MG PO TABS
100.0000 mg | ORAL_TABLET | Freq: Four times a day (QID) | ORAL | Status: DC
  Administered 2011-09-21 – 2011-09-22 (×4): 100 mg via ORAL
  Filled 2011-09-21: qty 2
  Filled 2011-09-21: qty 1
  Filled 2011-09-21 (×5): qty 2

## 2011-09-21 MED ORDER — AMOXICILLIN-POT CLAVULANATE 875-125 MG PO TABS
1.0000 | ORAL_TABLET | Freq: Two times a day (BID) | ORAL | Status: DC
  Administered 2011-09-21 – 2011-09-22 (×3): 1 via ORAL
  Filled 2011-09-21 (×4): qty 1

## 2011-09-21 NOTE — Progress Notes (Signed)
Stable.  Off PCA now.  This patient has been seen and I agree with the findings and treatment plan.  Marta Lamas. Gae Bon, MD, FACS [phone] (pager) [phone] (direct pager) Trauma Surgeon
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
## 2011-09-22 MED ORDER — AMOXICILLIN-POT CLAVULANATE 875-125 MG PO TABS: 1.0000 | ORAL_TABLET | Freq: Two times a day (BID) | ORAL | Status: AC

## 2011-09-22 MED ORDER — TRAMADOL HCL 50 MG PO TABS: 100.0000 mg | ORAL_TABLET | Freq: Four times a day (QID) | ORAL | Status: AC

## 2011-09-22 MED ORDER — NAPROXEN 500 MG PO TABS: 500.0000 mg | ORAL_TABLET | Freq: Two times a day (BID) | ORAL | Status: DC

## 2011-09-22 MED ORDER — OXYCODONE-ACETAMINOPHEN 7.5-325 MG PO TABS: 1.0000 | ORAL_TABLET | ORAL | Status: AC | PRN

## 2011-09-22 NOTE — Progress Notes (Signed)
UR complete 
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
## 2011-09-22 NOTE — Discharge Summary (Signed)
Jayro Mcmath, MD, MPH, FACS Pager: [phone]  

## 2011-09-22 NOTE — Progress Notes (Signed)
Tolerated lunch, I suspect will be ready to D/C this PM Patient examined and I agree with the assessment and plan  Violeta Gelinas, MD, MPH, FACS Pager: [phone]  09/22/2011 1:32 PM
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
## 2011-09-22 NOTE — Progress Notes (Signed)
Discharge instructions/Med Rec Sheet reviewed w/ pt. Pt expressed understanding and copies given w/ prescriptions. Pt d/c'd in stable condition via w/c, accompanied by this RN 

## 2011-09-22 NOTE — Discharge Instructions (Signed)
Wash wounds in shower with soap and water daily. Do not soak.  Record output from drain and from catheters daily.  Avoid heavy lifting until cleared by urology.  No driving while on pain medicine.  No nose blowing until cleared by Dr. Lazarus Salines.
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
## 2011-09-24 LAB — CULTURE, RESPIRATORY W GRAM STAIN: Culture: NORMAL

## 2011-09-30 ENCOUNTER — Telehealth: Payer: Self-pay | Admitting: Orthopedic Surgery

## 2011-09-30 MED ORDER — HYDROCODONE-ACETAMINOPHEN 10-325 MG PO TABS: 1.0000 | ORAL_TABLET | ORAL | Status: AC | PRN

## 2011-09-30 NOTE — Telephone Encounter (Signed)
Wife called to get refill on percocet. Patient still having incisional pain as well as chest wall pain. I told her it would be better if Dr. Margarita Grizzle was prescribing this as he is still following the patient but I would give him enough equivalent Norco for the weekend. I asked her to call Dr. Hilario Quarry office today so they could get him another rx on Monday if needed.

## 2011-11-11 ENCOUNTER — Other Ambulatory Visit: Payer: Self-pay | Admitting: Urology

## 2011-11-24 ENCOUNTER — Encounter (HOSPITAL_BASED_OUTPATIENT_CLINIC_OR_DEPARTMENT_OTHER): Payer: Self-pay | Admitting: *Deleted

## 2011-11-24 NOTE — Progress Notes (Signed)
NPO AFTER MN WITH EXCEPTION WATER/ GATORADE UNTIL 0700. ARRIVES AT 1145. NEEDS HG AND EKG. WILL BRING INHALER AND MAY TAKE TRAMADOL IF NEEDED.

## 2011-11-30 ENCOUNTER — Emergency Department (HOSPITAL_COMMUNITY): Payer: BC Managed Care – PPO

## 2011-11-30 ENCOUNTER — Encounter (HOSPITAL_BASED_OUTPATIENT_CLINIC_OR_DEPARTMENT_OTHER): Payer: Self-pay | Admitting: Anesthesiology

## 2011-11-30 ENCOUNTER — Encounter (HOSPITAL_COMMUNITY): Payer: Self-pay | Admitting: Emergency Medicine

## 2011-11-30 ENCOUNTER — Encounter (HOSPITAL_BASED_OUTPATIENT_CLINIC_OR_DEPARTMENT_OTHER): Admission: RE | Payer: Self-pay | Source: Ambulatory Visit

## 2011-11-30 ENCOUNTER — Emergency Department (HOSPITAL_COMMUNITY)
Admission: EM | Admit: 2011-11-30 | Discharge: 2011-11-30 | Disposition: A | Discharge status: Home / Self Care | Payer: BC Managed Care – PPO | Attending: Emergency Medicine | Admitting: Emergency Medicine

## 2011-11-30 ENCOUNTER — Ambulatory Visit (HOSPITAL_BASED_OUTPATIENT_CLINIC_OR_DEPARTMENT_OTHER): Admission: RE | Admit: 2011-11-30 | Payer: BC Managed Care – PPO | Source: Ambulatory Visit | Admitting: Urology

## 2011-11-30 DIAGNOSIS — S2241XA Multiple fractures of ribs, right side, initial encounter for closed fracture: Secondary | ICD-10-CM

## 2011-11-30 DIAGNOSIS — J45909 Unspecified asthma, uncomplicated: Secondary | ICD-10-CM | POA: Insufficient documentation

## 2011-11-30 DIAGNOSIS — W19XXXA Unspecified fall, initial encounter: Secondary | ICD-10-CM

## 2011-11-30 DIAGNOSIS — W1809XA Striking against other object with subsequent fall, initial encounter: Secondary | ICD-10-CM | POA: Insufficient documentation

## 2011-11-30 DIAGNOSIS — Z7982 Long term (current) use of aspirin: Secondary | ICD-10-CM | POA: Insufficient documentation

## 2011-11-30 DIAGNOSIS — K219 Gastro-esophageal reflux disease without esophagitis: Secondary | ICD-10-CM | POA: Insufficient documentation

## 2011-11-30 DIAGNOSIS — S2249XA Multiple fractures of ribs, unspecified side, initial encounter for closed fracture: Secondary | ICD-10-CM | POA: Insufficient documentation

## 2011-11-30 DIAGNOSIS — T310 Burns involving less than 10% of body surface: Secondary | ICD-10-CM

## 2011-11-30 DIAGNOSIS — R1011 Right upper quadrant pain: Secondary | ICD-10-CM | POA: Insufficient documentation

## 2011-11-30 DIAGNOSIS — IMO0002 Reserved for concepts with insufficient information to code with codable children: Secondary | ICD-10-CM | POA: Insufficient documentation

## 2011-11-30 DIAGNOSIS — Z96659 Presence of unspecified artificial knee joint: Secondary | ICD-10-CM | POA: Insufficient documentation

## 2011-11-30 DIAGNOSIS — R0602 Shortness of breath: Secondary | ICD-10-CM | POA: Insufficient documentation

## 2011-11-30 DIAGNOSIS — Y92009 Unspecified place in unspecified non-institutional (private) residence as the place of occurrence of the external cause: Secondary | ICD-10-CM | POA: Insufficient documentation

## 2011-11-30 HISTORY — DX: Unspecified asthma, uncomplicated: J45.909

## 2011-11-30 HISTORY — DX: Personal history of traumatic brain injury: Z87.820

## 2011-11-30 HISTORY — DX: Allergic rhinitis, unspecified: J30.9

## 2011-11-30 LAB — POCT I-STAT, CHEM 8
BUN: 7 mg/dL (ref 6–23)
Chloride: 105 mEq/L (ref 96–112)
Potassium: 3.9 mEq/L (ref 3.5–5.1)
Sodium: 142 mEq/L (ref 135–145)

## 2011-11-30 LAB — CBC WITH DIFFERENTIAL/PLATELET
Basophils Absolute: 0 10*3/uL (ref 0.0–0.1)
HCT: 45.3 % (ref 39.0–52.0)
Lymphocytes Relative: 14 % (ref 12–46)
Monocytes Absolute: 0.6 10*3/uL (ref 0.1–1.0)
Neutro Abs: 7.9 10*3/uL — ABNORMAL HIGH (ref 1.7–7.7)
Neutrophils Relative %: 80 % — ABNORMAL HIGH (ref 43–77)
Platelets: 188 10*3/uL (ref 150–400)
RDW: 14.2 % (ref 11.5–15.5)
WBC: 9.9 10*3/uL (ref 4.0–10.5)

## 2011-11-30 SURGERY — CYSTOSCOPY, WITH RETROGRADE PYELOGRAM
Anesthesia: General | Laterality: Bilateral

## 2011-11-30 MED ORDER — HYDROMORPHONE HCL PF 1 MG/ML IJ SOLN
1.0000 mg | Freq: Once | INTRAMUSCULAR | Status: AC
  Administered 2011-11-30: 1 mg via INTRAVENOUS
  Filled 2011-11-30: qty 1

## 2011-11-30 MED ORDER — ONDANSETRON HCL 4 MG/2ML IJ SOLN
4.0000 mg | Freq: Once | INTRAMUSCULAR | Status: AC
  Administered 2011-11-30: 4 mg via INTRAVENOUS
  Filled 2011-11-30: qty 2

## 2011-11-30 MED ORDER — OXYCODONE-ACETAMINOPHEN 5-325 MG PO TABS
2.0000 | ORAL_TABLET | Freq: Once | ORAL | Status: AC
  Administered 2011-11-30: 2 via ORAL
  Filled 2011-11-30: qty 2

## 2011-11-30 MED ORDER — SILVER SULFADIAZINE 1 % EX CREA
TOPICAL_CREAM | Freq: Once | CUTANEOUS | Status: AC
  Administered 2011-11-30: 15:00:00 via TOPICAL
  Filled 2011-11-30: qty 50

## 2011-11-30 MED ORDER — IOHEXOL 300 MG/ML  SOLN
100.0000 mL | Freq: Once | INTRAMUSCULAR | Status: AC | PRN
  Administered 2011-11-30: 100 mL via INTRAVENOUS

## 2011-11-30 MED ORDER — OXYCODONE-ACETAMINOPHEN 5-325 MG PO TABS: 1.0000 | ORAL_TABLET | Freq: Four times a day (QID) | ORAL | Status: AC | PRN
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
## 2011-12-01 ENCOUNTER — Other Ambulatory Visit: Payer: Self-pay | Admitting: Urology

## 2011-12-01 NOTE — ED Provider Notes (Signed)
Medical screening examination/treatment/procedure(s) were performed by non-physician practitioner and as supervising physician I was immediately available for consultation/collaboration.   Celene Kras, MD 12/01/11 [phone]
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
## 2011-12-12 ENCOUNTER — Encounter (HOSPITAL_BASED_OUTPATIENT_CLINIC_OR_DEPARTMENT_OTHER): Payer: Self-pay | Admitting: *Deleted
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
## 2011-12-14 ENCOUNTER — Ambulatory Visit (HOSPITAL_BASED_OUTPATIENT_CLINIC_OR_DEPARTMENT_OTHER)
Admission: RE | Admit: 2011-12-14 | Discharge: 2011-12-14 | Disposition: A | Discharge status: Home / Self Care | Payer: BC Managed Care – PPO | Source: Ambulatory Visit | Attending: Urology | Admitting: Urology

## 2011-12-14 ENCOUNTER — Ambulatory Visit (HOSPITAL_BASED_OUTPATIENT_CLINIC_OR_DEPARTMENT_OTHER): Payer: BC Managed Care – PPO | Admitting: Anesthesiology

## 2011-12-14 ENCOUNTER — Encounter (HOSPITAL_BASED_OUTPATIENT_CLINIC_OR_DEPARTMENT_OTHER)
Admission: RE | Disposition: A | Discharge status: Home / Self Care | Payer: Self-pay | Source: Ambulatory Visit | Attending: Urology

## 2011-12-14 ENCOUNTER — Encounter (HOSPITAL_BASED_OUTPATIENT_CLINIC_OR_DEPARTMENT_OTHER): Payer: Self-pay | Admitting: *Deleted

## 2011-12-14 ENCOUNTER — Encounter (HOSPITAL_BASED_OUTPATIENT_CLINIC_OR_DEPARTMENT_OTHER): Payer: Self-pay | Admitting: Anesthesiology

## 2011-12-14 DIAGNOSIS — D414 Neoplasm of uncertain behavior of bladder: Secondary | ICD-10-CM | POA: Insufficient documentation

## 2011-12-14 DIAGNOSIS — Z125 Encounter for screening for malignant neoplasm of prostate: Secondary | ICD-10-CM | POA: Insufficient documentation

## 2011-12-14 DIAGNOSIS — K219 Gastro-esophageal reflux disease without esophagitis: Secondary | ICD-10-CM | POA: Insufficient documentation

## 2011-12-14 DIAGNOSIS — J45909 Unspecified asthma, uncomplicated: Secondary | ICD-10-CM | POA: Insufficient documentation

## 2011-12-14 HISTORY — PX: CYSTOSCOPY W/ RETROGRADES: SHX1426

## 2011-12-14 HISTORY — PX: CYSTOSCOPY WITH BIOPSY: SHX5122

## 2011-12-14 LAB — POCT HEMOGLOBIN-HEMACUE: Hemoglobin: 15.2 g/dL (ref 13.0–17.0)

## 2011-12-14 SURGERY — CYSTOSCOPY, WITH RETROGRADE PYELOGRAM
Anesthesia: General | Site: Ureter | Wound class: Clean Contaminated

## 2011-12-14 MED ORDER — ONDANSETRON HCL 4 MG/2ML IJ SOLN
INTRAMUSCULAR | Status: DC | PRN
  Administered 2011-12-14: 4 mg via INTRAVENOUS

## 2011-12-14 MED ORDER — IOHEXOL 350 MG/ML SOLN
INTRAVENOUS | Status: DC | PRN
  Administered 2011-12-14: 17 mL

## 2011-12-14 MED ORDER — HYOSCYAMINE SULFATE 0.125 MG PO TABS: 0.1250 mg | ORAL_TABLET | ORAL | Status: DC | PRN

## 2011-12-14 MED ORDER — FENTANYL CITRATE 0.05 MG/ML IJ SOLN
INTRAMUSCULAR | Status: DC | PRN
  Administered 2011-12-14 (×3): 50 ug via INTRAVENOUS

## 2011-12-14 MED ORDER — HYDROCODONE-ACETAMINOPHEN 5-325 MG PO TABS: 1.0000 | ORAL_TABLET | ORAL | Status: AC | PRN

## 2011-12-14 MED ORDER — CEPHALEXIN 500 MG PO CAPS: 500.0000 mg | ORAL_CAPSULE | Freq: Three times a day (TID) | ORAL | Status: AC

## 2011-12-14 MED ORDER — BELLADONNA ALKALOIDS-OPIUM 16.2-60 MG RE SUPP
RECTAL | Status: DC | PRN
  Administered 2011-12-14: 1 via RECTAL

## 2011-12-14 MED ORDER — LACTATED RINGERS IV SOLN: INTRAVENOUS | Status: DC

## 2011-12-14 MED ORDER — PROPOFOL 10 MG/ML IV EMUL
INTRAVENOUS | Status: DC | PRN
  Administered 2011-12-14: 250 mg via INTRAVENOUS

## 2011-12-14 MED ORDER — FENTANYL CITRATE 0.05 MG/ML IJ SOLN: 25.0000 ug | INTRAMUSCULAR | Status: DC | PRN

## 2011-12-14 MED ORDER — CEFAZOLIN SODIUM-DEXTROSE 2-3 GM-% IV SOLR
2.0000 g | INTRAVENOUS | Status: AC
  Administered 2011-12-14: 2 g via INTRAVENOUS

## 2011-12-14 MED ORDER — CEFAZOLIN SODIUM 1-5 GM-% IV SOLN: 1.0000 g | INTRAVENOUS | Status: DC

## 2011-12-14 MED ORDER — GLYCOPYRROLATE 0.2 MG/ML IJ SOLN
INTRAMUSCULAR | Status: DC | PRN
  Administered 2011-12-14: 0.2 mg via INTRAVENOUS

## 2011-12-14 MED ORDER — LACTATED RINGERS IV SOLN
INTRAVENOUS | Status: DC
  Administered 2011-12-14 (×2): via INTRAVENOUS

## 2011-12-14 MED ORDER — STERILE WATER FOR IRRIGATION IR SOLN
Status: DC | PRN
  Administered 2011-12-14: 3000 mL

## 2011-12-14 MED ORDER — DEXAMETHASONE SODIUM PHOSPHATE 4 MG/ML IJ SOLN
INTRAMUSCULAR | Status: DC | PRN
  Administered 2011-12-14: 10 mg via INTRAVENOUS

## 2011-12-14 MED ORDER — LIDOCAINE HCL (CARDIAC) 20 MG/ML IV SOLN
INTRAVENOUS | Status: DC | PRN
  Administered 2011-12-14: 80 mg via INTRAVENOUS

## 2011-12-14 MED ORDER — PHENAZOPYRIDINE HCL 100 MG PO TABS: 100.0000 mg | ORAL_TABLET | Freq: Three times a day (TID) | ORAL | Status: AC | PRN

## 2011-12-14 SURGICAL SUPPLY — 40 items
ADAPTER CATH URET PLST 4-6FR (CATHETERS) IMPLANT
ADPR CATH URET STRL DISP 4-6FR (CATHETERS)
BAG DRAIN URO-CYSTO SKYTR STRL (DRAIN) ×3 IMPLANT
BAG DRN UROCATH (DRAIN) ×2
BASKET LASER NITINOL 1.9FR (BASKET) IMPLANT
BASKET STNLS GEMINI 4WIRE 3FR (BASKET) IMPLANT
BASKET ZERO TIP NITINOL 2.4FR (BASKET) IMPLANT
BRUSH URET BIOPSY 3F (UROLOGICAL SUPPLIES) IMPLANT
BSKT STON RTRVL 120 1.9FR (BASKET)
BSKT STON RTRVL GEM 120X11 3FR (BASKET)
BSKT STON RTRVL ZERO TP 2.4FR (BASKET)
CANISTER SUCT LVC 12 LTR MEDI- (MISCELLANEOUS) ×1 IMPLANT
CATH INTERMIT  6FR 70CM (CATHETERS) ×2 IMPLANT
CATH URET 5FR 28IN CONE TIP (BALLOONS)
CATH URET 5FR 28IN OPEN ENDED (CATHETERS) IMPLANT
CATH URET 5FR 70CM CONE TIP (BALLOONS) IMPLANT
CLOTH BEACON ORANGE TIMEOUT ST (SAFETY) ×3 IMPLANT
DRAPE CAMERA CLOSED 9X96 (DRAPES) ×3 IMPLANT
ELECT REM PT RETURN 9FT ADLT (ELECTROSURGICAL) ×3
ELECTRODE REM PT RTRN 9FT ADLT (ELECTROSURGICAL) ×1 IMPLANT
GLOVE BIOGEL PI IND STRL 7.0 (GLOVE) ×1 IMPLANT
GLOVE BIOGEL PI INDICATOR 7.0 (GLOVE) ×1
GLOVE ECLIPSE 7.0 STRL STRAW (GLOVE) ×6 IMPLANT
GLOVE INDICATOR 7.5 STRL GRN (GLOVE) ×3 IMPLANT
GOWN PREVENTION PLUS LG XLONG (DISPOSABLE) ×3 IMPLANT
GOWN STRL NON-REIN LRG LVL3 (GOWN DISPOSABLE) ×1 IMPLANT
GUIDEWIRE 0.038 PTFE COATED (WIRE) IMPLANT
GUIDEWIRE ANG ZIPWIRE 038X150 (WIRE) IMPLANT
GUIDEWIRE STR DUAL SENSOR (WIRE) ×3 IMPLANT
IV NS IRRIG 3000ML ARTHROMATIC (IV SOLUTION) ×2 IMPLANT
KIT BALLIN UROMAX 15FX10 (LABEL) IMPLANT
KIT BALLN UROMAX 15FX4 (MISCELLANEOUS) IMPLANT
KIT BALLN UROMAX 26 75X4 (MISCELLANEOUS)
LASER FIBER DISP (UROLOGICAL SUPPLIES) IMPLANT
PACK CYSTOSCOPY (CUSTOM PROCEDURE TRAY) ×3 IMPLANT
SET HIGH PRES BAL DIL (LABEL)
SHEATH URET ACCESS 12FR/35CM (UROLOGICAL SUPPLIES) IMPLANT
SHEATH URET ACCESS 12FR/55CM (UROLOGICAL SUPPLIES) IMPLANT
SYRINGE IRR TOOMEY STRL 70CC (SYRINGE) IMPLANT
WATER STERILE IRR 3000ML UROMA (IV SOLUTION) ×3 IMPLANT

## 2011-12-14 NOTE — Anesthesia Postprocedure Evaluation (Signed)
  Anesthesia Post-op Note  Patient: Christian [name]  Procedure(s) Performed: Procedure(s) (LRB): CYSTOSCOPY WITH RETROGRADE PYELOGRAM (Bilateral) CYSTOSCOPY WITH BIOPSY (N/A)  Patient Location: PACU  Anesthesia Type: General  Level of Consciousness: awake and alert   Airway and Oxygen Therapy: Patient Spontanous Breathing  Post-op Pain: mild  Post-op Assessment: Post-op Vital signs reviewed, Patient's Cardiovascular Status Stable, Respiratory Function Stable, Patent Airway and No signs of Nausea or vomiting  Post-op Vital Signs: stable  Complications: No apparent anesthesia complications

## 2011-12-14 NOTE — Anesthesia Preprocedure Evaluation (Signed)
Anesthesia Evaluation  Patient identified by MRN, date of birth, ID band Patient awake    Reviewed: Allergy & Precautions, H&P , NPO status , Patient's Chart, lab work & pertinent test results  Airway Mallampati: II TM Distance: >3 FB Neck ROM: full    Dental No notable dental hx. (+) Teeth Intact and Dental Advisory Given   Pulmonary neg pulmonary ROS, asthma ,  Mild asthma.  Hx. Recent rib fractures. breath sounds clear to auscultation  Pulmonary exam normal       Cardiovascular Exercise Tolerance: Good negative cardio ROS  Rhythm:regular Rate:Normal     Neuro/Psych negative neurological ROS  negative psych ROS   GI/Hepatic negative GI ROS, Neg liver ROS, (+)     substance abuse  alcohol use,   Endo/Other  negative endocrine ROS  Renal/GU negative Renal ROS  negative genitourinary   Musculoskeletal   Abdominal   Peds  Hematology negative hematology ROS (+)   Anesthesia Other Findings   Reproductive/Obstetrics negative OB ROS                           Anesthesia Physical Anesthesia Plan  ASA: II  Anesthesia Plan: General   Post-op Pain Management:    Induction: Intravenous  Airway Management Planned: LMA  Additional Equipment:   Intra-op Plan:   Post-operative Plan:   Informed Consent: I have reviewed the patients History and Physical, chart, labs and discussed the procedure including the risks, benefits and alternatives for the proposed anesthesia with the patient or authorized representative who has indicated his/her understanding and acceptance.   Dental Advisory Given  Plan Discussed with: CRNA and Surgeon  Anesthesia Plan Comments:         Anesthesia Quick Evaluation
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
## 2011-12-14 NOTE — Anesthesia Procedure Notes (Signed)
Procedure Name: LMA Insertion Date/Time: 12/14/2011 11:36 AM Performed by: Fran Lowes Pre-anesthesia Checklist: Patient identified, Emergency Drugs available, Suction available and Patient being monitored Patient Re-evaluated:Patient Re-evaluated prior to inductionOxygen Delivery Method: Circle System Utilized Preoxygenation: Pre-oxygenation with 100% oxygen Intubation Type: IV induction Ventilation: Mask ventilation without difficulty LMA: LMA inserted LMA Size: 4.0 Number of attempts: 1 Airway Equipment and Method: bite block Placement Confirmation: positive ETCO2 Tube secured with: Tape Dental Injury: Teeth and Oropharynx as per pre-operative assessment

## 2011-12-14 NOTE — Brief Op Note (Signed)
12/14/2011  12:04 PM  PATIENT:  Christian [name]  51 y.o. male  PRE-OPERATIVE DIAGNOSIS:  Bladder Lesion  POST-OPERATIVE DIAGNOSIS:  Bladder Lesion  PROCEDURE:  Procedure(s) (LRB): CYSTOSCOPY WITH RETROGRADE PYELOGRAM (Bilateral) CYSTOSCOPY WITH BIOPSY (N/A) Rectal exam under anesthesia  SURGEON:  Surgeon(s) and Role:    * Milford Cage, MD - Primary  PHYSICIAN ASSISTANT:   ASSISTANTS: none   ANESTHESIA:   general  EBL:   None  BLOOD ADMINISTERED:none  DRAINS: none   LOCAL MEDICATIONS USED:  OTHER B&O suppository.  SPECIMEN:  Source of Specimen:  Bladder tumor.  DISPOSITION OF SPECIMEN:  PATHOLOGY  COUNTS:  YES  TOURNIQUET:  * No tourniquets in log *  DICTATION: .Other Dictation: Dictation Number [id number]  PLAN OF CARE: Discharge to home after PACU  PATIENT DISPOSITION:  PACU - hemodynamically stable.   Delay start of Pharmacological VTE agent (>24hrs) due to surgical blood loss or risk of bleeding: no

## 2011-12-14 NOTE — Transfer of Care (Signed)
Immediate Anesthesia Transfer of Care Note  Patient: Christian [name]  Procedure(s) Performed: Procedure(s) (LRB): CYSTOSCOPY WITH RETROGRADE PYELOGRAM (Bilateral) CYSTOSCOPY WITH BIOPSY (N/A)  Patient Location: Patient transported to PACU with oxygen via face mask at 4 Liters / Min  Anesthesia Type: General  Level of Consciousness: awake and alert   Airway & Oxygen Therapy: Patient Spontanous Breathing and Patient connected to face mask oxygen  Post-op Assessment: Report given to PACU RN and Post -op Vital signs reviewed and stable  Post vital signs: Reviewed and stable  Dentition: Teeth and oropharynx remain in pre-op condition  Complications: No apparent anesthesia complications

## 2011-12-15 ENCOUNTER — Encounter (HOSPITAL_BASED_OUTPATIENT_CLINIC_OR_DEPARTMENT_OTHER): Payer: Self-pay | Admitting: Urology
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
## 2011-12-26 ENCOUNTER — Other Ambulatory Visit (HOSPITAL_COMMUNITY): Payer: Self-pay | Admitting: Orthopedic Surgery

## 2012-02-16 ENCOUNTER — Telehealth: Payer: Self-pay | Admitting: Internal Medicine

## 2012-02-16 NOTE — Telephone Encounter (Signed)
Patient is calling to see if you would be willing to accept him as a patient since you are currently treating his parents and siblings, please advise/  Thanks

## 2012-02-17 NOTE — Telephone Encounter (Signed)
Ok Thx 

## 2012-02-17 NOTE — Telephone Encounter (Signed)
Apt 05/15/2012 @ 9:45, patient aware

## 2012-02-20 ENCOUNTER — Telehealth: Payer: Self-pay | Admitting: Internal Medicine

## 2012-02-20 NOTE — Telephone Encounter (Signed)
Ok - may be tomorrow. If the pain is severe - he needs to go to ER Thx

## 2012-02-20 NOTE — Telephone Encounter (Signed)
Patient schedule 02/21/2012 @ 245

## 2012-02-20 NOTE — Telephone Encounter (Signed)
Per patients mother he has a hernia and needs to be seen today or tomorrow because he is in pain. This is a new patient can he be worked in?

## 2012-02-21 ENCOUNTER — Encounter: Payer: Self-pay | Admitting: Internal Medicine

## 2012-02-21 ENCOUNTER — Ambulatory Visit (INDEPENDENT_AMBULATORY_CARE_PROVIDER_SITE_OTHER): Payer: BC Managed Care – PPO | Admitting: Internal Medicine

## 2012-02-21 ENCOUNTER — Other Ambulatory Visit (INDEPENDENT_AMBULATORY_CARE_PROVIDER_SITE_OTHER): Payer: BC Managed Care – PPO

## 2012-02-21 VITALS — BP 150/90 | HR 68 | Temp 98.2°F | Resp 16 | Ht 69.0 in | Wt 184.0 lb

## 2012-02-21 DIAGNOSIS — F329 Major depressive disorder, single episode, unspecified: Secondary | ICD-10-CM | POA: Insufficient documentation

## 2012-02-21 DIAGNOSIS — R1031 Right lower quadrant pain: Secondary | ICD-10-CM | POA: Insufficient documentation

## 2012-02-21 DIAGNOSIS — K4090 Unilateral inguinal hernia, without obstruction or gangrene, not specified as recurrent: Secondary | ICD-10-CM | POA: Insufficient documentation

## 2012-02-21 DIAGNOSIS — F341 Dysthymic disorder: Secondary | ICD-10-CM

## 2012-02-21 DIAGNOSIS — D494 Neoplasm of unspecified behavior of bladder: Secondary | ICD-10-CM | POA: Insufficient documentation

## 2012-02-21 DIAGNOSIS — C679 Malignant neoplasm of bladder, unspecified: Secondary | ICD-10-CM

## 2012-02-21 DIAGNOSIS — F1010 Alcohol abuse, uncomplicated: Secondary | ICD-10-CM

## 2012-02-21 LAB — CBC WITH DIFFERENTIAL/PLATELET
Basophils Absolute: 0.1 10*3/uL (ref 0.0–0.1)
Eosinophils Relative: 1.6 % (ref 0.0–5.0)
HCT: 43.2 % (ref 39.0–52.0)
Lymphocytes Relative: 20.5 % (ref 12.0–46.0)
Monocytes Relative: 4.7 % (ref 3.0–12.0)
Neutrophils Relative %: 72.3 % (ref 43.0–77.0)
Platelets: 165 10*3/uL (ref 150.0–400.0)
WBC: 9.2 10*3/uL (ref 4.5–10.5)

## 2012-02-21 LAB — HEPATIC FUNCTION PANEL
AST: 12 U/L (ref 0–37)
Albumin: 4.4 g/dL (ref 3.5–5.2)
Alkaline Phosphatase: 68 U/L (ref 39–117)
Bilirubin, Direct: 0.2 mg/dL (ref 0.0–0.3)
Total Protein: 7.2 g/dL (ref 6.0–8.3)

## 2012-02-21 LAB — PROTIME-INR: INR: 1.1 ratio — ABNORMAL HIGH (ref 0.8–1.0)

## 2012-02-21 LAB — BASIC METABOLIC PANEL
CO2: 28 mEq/L (ref 19–32)
Chloride: 105 mEq/L (ref 96–112)
Glucose, Bld: 87 mg/dL (ref 70–99)
Potassium: 3.9 mEq/L (ref 3.5–5.1)
Sodium: 141 mEq/L (ref 135–145)

## 2012-02-21 MED ORDER — ESCITALOPRAM OXALATE 10 MG PO TABS: 10.0000 mg | ORAL_TABLET | Freq: Every day | ORAL | Status: DC

## 2012-02-21 MED ORDER — TRAMADOL HCL 50 MG PO TABS: 50.0000 mg | ORAL_TABLET | Freq: Two times a day (BID) | ORAL | Status: DC | PRN

## 2012-02-21 NOTE — Assessment & Plan Note (Addendum)
Tramadol prn  Surg ref Labs

## 2012-02-21 NOTE — Assessment & Plan Note (Signed)
Recovering  

## 2012-02-21 NOTE — Progress Notes (Signed)
  Subjective:    Patient ID: Christian [name], male    DOB: [birth year], 51 y.o.   MRN: [id number]  HPI   C/o severe pain in the R groin 7/10 - x 1-2 wks - worse lately Review of Systems  Constitutional: Negative for appetite change, fatigue and unexpected weight change.  HENT: Negative for nosebleeds, congestion, sore throat, sneezing, trouble swallowing and neck pain.   Eyes: Negative for itching and visual disturbance.  Respiratory: Negative for cough.   Cardiovascular: Negative for chest pain, palpitations and leg swelling.  Gastrointestinal: Negative for nausea, diarrhea, blood in stool and abdominal distention.  Genitourinary: Negative for frequency and hematuria.  Musculoskeletal: Negative for back pain, joint swelling and gait problem.  Skin: Negative for rash.  Neurological: Negative for dizziness, tremors, speech difficulty and weakness.  Psychiatric/Behavioral: Negative for suicidal ideas, disturbed wake/sleep cycle, dysphoric mood and agitation. The patient is nervous/anxious.        Objective:   Physical Exam  Constitutional: He is oriented to person, place, and time. He appears well-developed.  HENT:  Mouth/Throat: Oropharynx is clear and moist.  Eyes: Conjunctivae normal are normal. Pupils are equal, round, and reactive to light.  Neck: Normal range of motion. No JVD present. No thyromegaly present.  Cardiovascular: Normal rate, regular rhythm, normal heart sounds and intact distal pulses.  Exam reveals no gallop and no friction rub.   No murmur heard. Pulmonary/Chest: Effort normal and breath sounds normal. No respiratory distress. He has no wheezes. He has no rales. He exhibits no tenderness.  Abdominal: Soft. Bowel sounds are normal. He exhibits mass. He exhibits no distension. There is tenderness. There is no rebound and no guarding.  Musculoskeletal: Normal range of motion. He exhibits no edema and no tenderness.  Lymphadenopathy:    He has no cervical adenopathy.    Neurological: He is alert and oriented to person, place, and time. He has normal reflexes. No cranial nerve deficit. He exhibits normal muscle tone. Coordination normal.  Skin: Skin is warm and dry. No rash noted.  Psychiatric: He has a normal mood and affect. His behavior is normal. Judgment and thought content normal.  Large tender RLQ/inguinal mass 22x12 cm Not homicidal or suicidal  Procedure Note :    Procedure :   Point of care (POC) sonography examination   Indication: R groin mass   Equipment used: Sonosite M-Turbo with HFL38x/13-6 MHz transducer linear probe. The images were stored in the unit and later transferred in storage.  The patient was placed in a decubitus position.  This study revealed a large complex mass with mobile bowel loops inside   Impression: findings c/w inguinal hernia    Chart reviewed     Assessment & Plan:   A complex case

## 2012-02-21 NOTE — Assessment & Plan Note (Signed)
Much better now 1-2 beers/night

## 2012-02-21 NOTE — Assessment & Plan Note (Addendum)
Surg consult tomorrow hopefuly [place]  To ER if worse

## 2012-02-21 NOTE — Assessment & Plan Note (Signed)
Start Lexapro He is seeing a Veterinary surgeon

## 2012-02-29 ENCOUNTER — Encounter (INDEPENDENT_AMBULATORY_CARE_PROVIDER_SITE_OTHER): Payer: Self-pay | Admitting: General Surgery

## 2012-02-29 ENCOUNTER — Ambulatory Visit (INDEPENDENT_AMBULATORY_CARE_PROVIDER_SITE_OTHER): Payer: BC Managed Care – PPO | Admitting: General Surgery

## 2012-02-29 VITALS — BP 144/102 | HR 60 | Temp 98.3°F | Resp 16 | Ht 69.0 in | Wt 185.0 lb

## 2012-02-29 DIAGNOSIS — K4090 Unilateral inguinal hernia, without obstruction or gangrene, not specified as recurrent: Secondary | ICD-10-CM

## 2012-02-29 NOTE — Patient Instructions (Signed)
[facility] _______[facility]  UMBILICAL OR INGUINAL HERNIA REPAIR: POST OP INSTRUCTIONS  Always review your discharge instruction sheet given to you by the facility where your surgery was performed. IF YOU HAVE DISABILITY OR FAMILY LEAVE FORMS, YOU MUST BRING THEM TO THE OFFICE FOR PROCESSING.   DO NOT GIVE THEM TO YOUR DOCTOR.  1. A  prescription for pain medication may be given to you upon discharge.  Take your pain medication as prescribed, if needed.  If narcotic pain medicine is not needed, then you may take acetaminophen (Tylenol) or ibuprofen (Advil) as needed. 2. Take your usually prescribed medications unless otherwise directed. 3. If you need a refill on your pain medication, please contact your pharmacy.  They will contact our office to request authorization. Prescriptions will not be filled after 5 pm or on week-ends. 4. You should follow a light diet the first 24 hours after arrival home, such as soup and crackers, etc.  Be sure to include lots of fluids daily.  Resume your normal diet the day after surgery. 5. Most patients will experience some swelling and bruising around the umbilicus or in the groin and scrotum.  Ice packs and reclining will help.  Swelling and bruising can take several days to resolve.  6. It is common to experience some constipation if taking pain medication after surgery.  Increasing fluid intake and taking a stool softener (such as Colace) will usually help or prevent this problem from occurring.  A mild laxative (Milk of Magnesia or Miralax) should be taken according to package directions if there are no bowel movements after 48 hours. 7. Unless discharge instructions indicate otherwise, you may remove your bandages 24-48 hours after surgery, and you may shower at that time.  You may have steri-strips (small skin tapes) in place directly over the incision.  These strips should be left on the skin for 7-10 days.  If your surgeon used skin glue on the  incision, you may shower in 24 hours.  The glue will flake off over the next 2-3 weeks.  Any sutures or staples will be removed at the office during your follow-up visit. 8. ACTIVITIES:  You may resume regular (light) daily activities beginning the next day-such as daily self-care, walking, climbing stairs-gradually increasing activities as tolerated.  You may have sexual intercourse when it is comfortable.  Refrain from any heavy lifting or straining until approved by your doctor. a. You may drive when you are no longer taking prescription pain medication, you can comfortably wear a seatbelt, and you can safely maneuver your car and apply brakes. b. RETURN TO WORK:  ____Desk work in 1-2 weeks.______________________________________________________ 9. You should see your doctor in the office for a follow-up appointment approximately 2-3 weeks after your surgery.  Make sure that you call for this appointment within a day or two after you arrive home to insure a convenient appointment time. 10. OTHER INSTRUCTIONS:  __________________________________________________________________________________________________________________________________________________________________________________________  WHEN TO CALL YOUR DOCTOR: 1. Fever over 101.5 2. Inability to urinate 3. Nausea and/or vomiting 4. Extreme swelling or bruising 5. Continued bleeding from incision. 6. Increased pain, redness, or drainage from the incision  The clinic staff is available to answer your questions during regular business hours.  Please don't hesitate to call and ask to speak to one of the nurses for clinical concerns.  If you have a medical emergency, go to the nearest emergency room or call 911.  A surgeon from [facility] is always on call at  the hospital   [address], [place], [place]  [address] ?  [address] [address], [place], [place]   [address] [phone] ? [phone] ? FAX [phone]  [phone] Web site: [email or web address]
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
## 2012-03-08 ENCOUNTER — Encounter (INDEPENDENT_AMBULATORY_CARE_PROVIDER_SITE_OTHER): Payer: Self-pay | Admitting: General Surgery

## 2012-03-08 ENCOUNTER — Other Ambulatory Visit: Payer: Self-pay | Admitting: *Deleted

## 2012-03-08 ENCOUNTER — Encounter: Payer: Self-pay | Admitting: Cardiovascular Disease

## 2012-03-08 DIAGNOSIS — R079 Chest pain, unspecified: Secondary | ICD-10-CM

## 2012-03-08 DIAGNOSIS — I10 Essential (primary) hypertension: Secondary | ICD-10-CM

## 2012-03-08 NOTE — Progress Notes (Unsigned)
Patient ID: Christian [name], male   DOB: 04-03-1961, 51 y.o.   MRN: [id number] He was scheduled to have a RIH repair today.  However, he was noted to have significant HTN with BPs of 182/108 and 181/114.  Also, he had inverted T waves on his EKG.  After further questioning, he has been having intermittent pressure type chest pains, headaches, and visual changes.  After discussion with the anesthesiologist, it was decided that his operation would be cancelled today and rescheduled once the above issues had been formally evaluated.  I discussed the findings with his PCP, Dr. Posey Rea, and he was going to have him evaluated by Cardiology.  Will wait to here from them before rescheduling his operation.  Christian [name] understands all of this and is agreeable with the plan.

## 2012-03-09 ENCOUNTER — Ambulatory Visit (INDEPENDENT_AMBULATORY_CARE_PROVIDER_SITE_OTHER): Payer: BC Managed Care – PPO | Admitting: Cardiovascular Disease

## 2012-03-09 ENCOUNTER — Encounter: Payer: Self-pay | Admitting: Cardiovascular Disease

## 2012-03-09 VITALS — BP 154/90 | HR 63 | Resp 18 | Ht 68.0 in | Wt 188.6 lb

## 2012-03-09 DIAGNOSIS — I10 Essential (primary) hypertension: Secondary | ICD-10-CM | POA: Insufficient documentation

## 2012-03-09 DIAGNOSIS — I517 Cardiomegaly: Secondary | ICD-10-CM | POA: Insufficient documentation

## 2012-03-09 DIAGNOSIS — R0789 Other chest pain: Secondary | ICD-10-CM | POA: Insufficient documentation

## 2012-03-09 MED ORDER — LISINOPRIL 10 MG PO TABS: 10.0000 mg | ORAL_TABLET | Freq: Every day | ORAL | Status: DC

## 2012-03-09 NOTE — Assessment & Plan Note (Signed)
Christian [name] presents with a history of uncontrolled hypertension as well as chest tightness. As suspected his chest tightness could be a result of his marked hypertension. I cannot exclude coronary artery disease so we will get a Myoview study for further evaluation. He will also need this in order to be cleared for his hernia surgery.

## 2012-03-09 NOTE — Assessment & Plan Note (Signed)
Christian [name] presents with somewhat elevated blood pressure readings. We'll add lisinopril 10 mg a day to his medical regimen. We'll have him see the nurse practitioner in several weeks. We'll also be giving an echocardiogram for further evaluation for left ventricular hypertrophy. I suspect that some of his EKG changes or due to left ventricular hypertrophy with repolarization abnormalities.  I cautioned him about eating any extra salt. I have asked him to get regular  exercise.
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
## 2012-03-09 NOTE — Patient Instructions (Addendum)
Your physician has requested that you have a lexiscan myoview. Please follow instruction sheet, as given.  Your physician has requested that you have an echocardiogram. Echocardiography is a painless test that uses sound waves to create images of your heart. It provides your doctor with information about the size and shape of your heart and how well your heart's chambers and valves are working. This procedure takes approximately one hour. There are no restrictions for this procedure.  Your physician has recommended you make the following change in your medication:   START LISINOPRIL 10MG  DAILY  NURSE VISIT FOR BLOOD PRESSURE IN 1 WEEK Your physician recommends that you schedule a follow-up appointment in: 3 WEEKS WITH Dr. Elease Hashimoto or Norma Fredrickson NP   REDUCE HIGH SODIUM FOODS LIKE CANNED SOUP, GRAVY, SAUCES, READY PREPARED FOODS LIKE FROZEN FOODS; LEAN CUISINE, LASAGNA. BACON, SAUSAGE, LUNCH MEAT, FAST FOODS.[name]   DASH Diet The DASH diet stands for "Dietary Approaches to Stop Hypertension." It is a healthy eating plan that has been shown to reduce high blood pressure (hypertension) in as little as 14 days, while also possibly providing other significant health benefits. These other health benefits include reducing the risk of breast cancer after menopause and reducing the risk of type 2 diabetes, heart disease, colon cancer, and stroke. Health benefits also include weight loss and slowing kidney failure in patients with chronic kidney disease.  DIET GUIDELINES  Limit salt (sodium). Your diet should contain less than 1500 mg of sodium daily.  Limit refined or processed carbohydrates. Your diet should include mostly whole grains. Desserts and added sugars should be used sparingly.  Include small amounts of heart-healthy fats. These types of fats include nuts, oils, and tub margarine. Limit saturated and trans fats. These fats have been shown to be harmful in the body. CHOOSING FOODS  The following  food groups are based on a 2000 calorie diet. See your Registered Dietitian for individual calorie needs. Grains and Grain Products (6 to 8 servings daily)  Eat More Often: Whole-wheat bread, brown rice, whole-grain or wheat pasta, quinoa, popcorn without added fat or salt (air popped).  Eat Less Often: White bread, white pasta, white rice, cornbread. Vegetables (4 to 5 servings daily)  Eat More Often: Fresh, frozen, and canned vegetables. Vegetables may be raw, steamed, roasted, or grilled with a minimal amount of fat.  Eat Less Often/Avoid: Creamed or fried vegetables. Vegetables in a cheese sauce. Fruit (4 to 5 servings daily)  Eat More Often: All fresh, canned (in natural juice), or frozen fruits. Dried fruits without added sugar. One hundred percent fruit juice ( cup [237 mL] daily).  Eat Less Often: Dried fruits with added sugar. Canned fruit in light or heavy syrup. [employer], Fish, and Poultry (2 servings or less daily. One serving is 3 to 4 oz [85-114 g]).  Eat More Often: Ninety percent or leaner ground beef, tenderloin, sirloin. Round cuts of beef, chicken breast, [place] breast. All fish. Grill, bake, or broil your meat. Nothing should be fried.  Eat Less Often/Avoid: Fatty cuts of meat, [place], or chicken leg, thigh, or wing. Fried cuts of meat or fish. Dairy (2 to 3 servings)  Eat More Often: Low-fat or fat-free milk, low-fat plain or light yogurt, reduced-fat or part-skim cheese.  Eat Less Often/Avoid: Milk (whole, 2%).Whole milk yogurt. Full-fat cheeses. Nuts, Seeds, and Legumes (4 to 5 servings per week)  Eat More Often: All without added salt.  Eat Less Often/Avoid: Salted nuts and seeds, canned beans with  added salt. Fats and Sweets (limited)  Eat More Often: Vegetable oils, tub margarines without trans fats, sugar-free gelatin. Mayonnaise and salad dressings.  Eat Less Often/Avoid: Coconut oils, palm oils, butter, stick margarine, cream, half and half,  cookies, candy, pie. FOR MORE INFORMATION The Dash Diet Eating Plan: www.dashdiet.org Document Released: 04/21/2011 Document Revised: 07/25/2011 Document Reviewed: 04/21/2011 [facility] Patient Information 2013 [place], [place].

## 2012-03-15 ENCOUNTER — Ambulatory Visit (HOSPITAL_COMMUNITY): Payer: BC Managed Care – PPO | Attending: Cardiology | Admitting: Radiology

## 2012-03-15 DIAGNOSIS — I369 Nonrheumatic tricuspid valve disorder, unspecified: Secondary | ICD-10-CM | POA: Insufficient documentation

## 2012-03-15 DIAGNOSIS — R9431 Abnormal electrocardiogram [ECG] [EKG]: Secondary | ICD-10-CM | POA: Insufficient documentation

## 2012-03-15 DIAGNOSIS — R0789 Other chest pain: Secondary | ICD-10-CM

## 2012-03-15 DIAGNOSIS — I10 Essential (primary) hypertension: Secondary | ICD-10-CM | POA: Insufficient documentation

## 2012-03-15 DIAGNOSIS — I517 Cardiomegaly: Secondary | ICD-10-CM

## 2012-03-15 DIAGNOSIS — I059 Rheumatic mitral valve disease, unspecified: Secondary | ICD-10-CM | POA: Insufficient documentation

## 2012-03-15 HISTORY — PX: TRANSTHORACIC ECHOCARDIOGRAM: SHX275

## 2012-03-15 NOTE — Progress Notes (Signed)
Echocardiogram performed.  

## 2012-03-20 ENCOUNTER — Ambulatory Visit (HOSPITAL_COMMUNITY): Payer: BC Managed Care – PPO | Attending: Cardiology | Admitting: Radiology

## 2012-03-20 VITALS — BP 141/91 | HR 56 | Ht 68.0 in | Wt 191.0 lb

## 2012-03-20 DIAGNOSIS — I10 Essential (primary) hypertension: Secondary | ICD-10-CM | POA: Insufficient documentation

## 2012-03-20 DIAGNOSIS — R079 Chest pain, unspecified: Secondary | ICD-10-CM

## 2012-03-20 DIAGNOSIS — I517 Cardiomegaly: Secondary | ICD-10-CM

## 2012-03-20 DIAGNOSIS — R000 Tachycardia, unspecified: Secondary | ICD-10-CM | POA: Insufficient documentation

## 2012-03-20 DIAGNOSIS — R9431 Abnormal electrocardiogram [ECG] [EKG]: Secondary | ICD-10-CM

## 2012-03-20 DIAGNOSIS — R0789 Other chest pain: Secondary | ICD-10-CM | POA: Insufficient documentation

## 2012-03-20 HISTORY — PX: CARDIOVASCULAR STRESS TEST: SHX262

## 2012-03-20 MED ORDER — TECHNETIUM TC 99M SESTAMIBI GENERIC - CARDIOLITE
11.0000 | Freq: Once | INTRAVENOUS | Status: AC | PRN
  Administered 2012-03-20: 11 via INTRAVENOUS

## 2012-03-20 MED ORDER — TECHNETIUM TC 99M SESTAMIBI GENERIC - CARDIOLITE
33.0000 | Freq: Once | INTRAVENOUS | Status: AC | PRN
  Administered 2012-03-20: 33 via INTRAVENOUS

## 2012-03-20 MED ORDER — REGADENOSON 0.4 MG/5ML IV SOLN
0.4000 mg | Freq: Once | INTRAVENOUS | Status: AC
  Administered 2012-03-20: 0.4 mg via INTRAVENOUS

## 2012-03-20 NOTE — Progress Notes (Signed)
[facility] SITE 3 NUCLEAR MED [id number] [place] [address] [phone]  Cardiology Nuclear Med Study  Christian [name] is a 51 y.o. male     MRN : [id number]     DOB: 1960/10/31  Procedure Date: 03/20/2012  Nuclear Med Background Indication for Stress Test:  Evaluation for Ischemia, Abnormal EKG and Pending Clearance for Hernia Repair by Dr. Abbey Chatters  History:  03/15/12 Echo:EF=60% Cardiac Risk Factors: Hypertension  Symptoms:  Chest Tightness with and without Exertion (last episode of chest discomfort was yesterday) and Rapid HR   Nuclear Pre-Procedure Caffeine/Decaff Intake:  None NPO After: 8:00pm   Lungs:  Clear. O2 Sat: 99% on room air. IV 0.9% NS with Angio Cath:  20g  IV Site: R Antecubital  IV Started by:  Stanton Kidney, EMT-P  Chest Size (in):  44 Cup Size: n/a  Height: 5\' 8"  (1.727 m)  Weight:  191 lb (86.637 kg)  BMI:  Body mass index is 29.04 kg/(m^2). Tech Comments:  NA    Nuclear Med Study 1 or 2 day study: 1 day  Stress Test Type:  Treadmill/Lexiscan  Reading MD: Willa Rough, MD  Order Authorizing Provider:  Kristeen Miss, MD  Resting Radionuclide: Technetium 20m Sestamibi  Resting Radionuclide Dose: 11.0 mCi   Stress Radionuclide:  Technetium 69m Sestamibi  Stress Radionuclide Dose: 33.0 mCi           Stress Protocol Rest HR: 56 Stress HR: 100  Rest BP: 141/91 Stress BP: 180/91  Exercise Time (min): 2:00 METS: n/a   Predicted Max HR: 171 bpm % Max HR: 58.48 bpm Rate Pressure Product: 53664   Dose of Adenosine (mg):  n/a Dose of Lexiscan: 0.4 mg  Dose of Atropine (mg): n/a Dose of Dobutamine: n/a mcg/kg/min (at max HR)  Stress Test Technologist: Smiley Houseman, CMA-N  Nuclear Technologist:  Domenic Polite, CNMT     Rest Procedure:  Myocardial perfusion imaging was performed at rest 45 minutes following the intravenous administration of Technetium 18m Sestamibi.  Rest ECG: IRBBB  Stress Procedure:  The  patient received IV Lexiscan 0.4 mg over 15-seconds with concurrent low level exercise and then Technetium 54m Sestamibi was injected at 30-seconds while the patient continued walking one more minute. There were no significant changes with Lexiscan. Quantitative spect images were obtained after a 45-minute delay.  Stress ECG: No significant ST segment change suggestive of ischemia.  QPS Raw Data Images:  Patient motion noted; appropriate software correction applied. Stress Images:  Normal homogeneous uptake in all areas of the myocardium. Rest Images:  Normal homogeneous uptake in all areas of the myocardium. Subtraction (SDS):  No evidence of ischemia. Transient Ischemic Dilatation (Normal <1.22):  1.00 Lung/Heart Ratio (Normal <0.45):  0.28  Quantitative Gated Spect Images QGS EDV:  151 ml QGS ESV:  77 ml  Impression Exercise Capacity:  Lexiscan with low level exercise. BP Response:  Normal blood pressure response. Clinical Symptoms:  No symptoms. ECG Impression:  No significant ST segment change suggestive of ischemia. Comparison with Prior Nuclear Study: No previous nuclear study performed  Overall Impression:  The images reveal no evidence of scar or ischemia. The ejection fraction is low normal. There are no definite abnormalities.  LV Ejection Fraction: 49%.  LV Wall Motion:    The ejection fraction is low normal for this technique. There are no focal wall motion abnormalities.  Willa Rough, MD

## 2012-03-21 ENCOUNTER — Encounter (INDEPENDENT_AMBULATORY_CARE_PROVIDER_SITE_OTHER): Payer: BC Managed Care – PPO | Admitting: General Surgery

## 2012-03-21 ENCOUNTER — Encounter: Payer: Self-pay | Admitting: Internal Medicine

## 2012-03-21 ENCOUNTER — Ambulatory Visit (INDEPENDENT_AMBULATORY_CARE_PROVIDER_SITE_OTHER): Payer: BC Managed Care – PPO | Admitting: Internal Medicine

## 2012-03-21 VITALS — BP 130/78 | HR 76 | Temp 97.8°F | Resp 16 | Wt 193.0 lb

## 2012-03-21 DIAGNOSIS — F329 Major depressive disorder, single episode, unspecified: Secondary | ICD-10-CM

## 2012-03-21 DIAGNOSIS — F341 Dysthymic disorder: Secondary | ICD-10-CM

## 2012-03-21 DIAGNOSIS — R0789 Other chest pain: Secondary | ICD-10-CM

## 2012-03-21 DIAGNOSIS — F419 Anxiety disorder, unspecified: Secondary | ICD-10-CM | POA: Insufficient documentation

## 2012-03-21 DIAGNOSIS — I10 Essential (primary) hypertension: Secondary | ICD-10-CM

## 2012-03-21 DIAGNOSIS — F410 Panic disorder [episodic paroxysmal anxiety] without agoraphobia: Secondary | ICD-10-CM

## 2012-03-21 DIAGNOSIS — K4090 Unilateral inguinal hernia, without obstruction or gangrene, not specified as recurrent: Secondary | ICD-10-CM

## 2012-03-21 MED ORDER — CLONAZEPAM 0.5 MG PO TABS: 0.5000 mg | ORAL_TABLET | Freq: Two times a day (BID) | ORAL | Status: DC | PRN

## 2012-03-21 NOTE — Assessment & Plan Note (Addendum)
Surgery is pending. He is clear for surgery  10/13 s/p Cardiol eval - clear

## 2012-03-21 NOTE — Assessment & Plan Note (Signed)
Recurrent - MSK and stress related 10/13 s/p Cardiol eval  Klonopin prn

## 2012-03-21 NOTE — Assessment & Plan Note (Signed)
Klonopin prn 

## 2012-03-21 NOTE — Progress Notes (Signed)
   Subjective:    Patient ID: Christian [name], male    DOB: 07/26/1960, 51 y.o.   MRN: [id number]  HPI   C/o severe pain in the R groin 7/10 - x 1-2 wks - worse lately. F/u HTN C/o anxiety attacks  BP Readings from Last 3 Encounters:  03/21/12 130/78  03/20/12 141/91  03/09/12 154/90   Wt Readings from Last 3 Encounters:  03/21/12 193 lb (87.544 kg)  03/20/12 191 lb (86.637 kg)  03/09/12 188 lb 9.6 oz (85.548 kg)      Review of Systems  Constitutional: Negative for appetite change, fatigue and unexpected weight change.  HENT: Negative for nosebleeds, congestion, sore throat, sneezing, trouble swallowing and neck pain.   Eyes: Negative for itching and visual disturbance.  Respiratory: Negative for cough.   Cardiovascular: Negative for chest pain, palpitations and leg swelling.  Gastrointestinal: Negative for nausea, diarrhea, blood in stool and abdominal distention.  Genitourinary: Negative for frequency and hematuria.  Musculoskeletal: Negative for back pain, joint swelling and gait problem.  Skin: Negative for rash.  Neurological: Negative for dizziness, tremors, speech difficulty and weakness.  Psychiatric/Behavioral: Negative for suicidal ideas, sleep disturbance, dysphoric mood and agitation. The patient is nervous/anxious.        Objective:   Physical Exam  Constitutional: He is oriented to person, place, and time. He appears well-developed.  HENT:  Mouth/Throat: Oropharynx is clear and moist.  Eyes: Conjunctivae normal are normal. Pupils are equal, round, and reactive to light.  Neck: Normal range of motion. No JVD present. No thyromegaly present.  Cardiovascular: Normal rate, regular rhythm, normal heart sounds and intact distal pulses.  Exam reveals no gallop and no friction rub.   No murmur heard. Pulmonary/Chest: Effort normal and breath sounds normal. No respiratory distress. He has no wheezes. He has no rales. He exhibits no tenderness.  Abdominal: Soft.  Bowel sounds are normal. He exhibits mass. He exhibits no distension. There is tenderness. There is no rebound and no guarding.  Musculoskeletal: Normal range of motion. He exhibits no edema and no tenderness.  Lymphadenopathy:    He has no cervical adenopathy.  Neurological: He is alert and oriented to person, place, and time. He has normal reflexes. No cranial nerve deficit. He exhibits normal muscle tone. Coordination normal.  Skin: Skin is warm and dry. No rash noted.  Psychiatric: He has a normal mood and affect. His behavior is normal. Judgment and thought content normal.  Large tender RLQ/inguinal mass 22x12 cm Not homicidal or suicidal   Chart reviewed     Assessment & Plan:

## 2012-03-21 NOTE — Assessment & Plan Note (Signed)
Citalopram was too strong - he stopped it

## 2012-03-21 NOTE — Assessment & Plan Note (Signed)
Surgery is pending. He is clear for surgery Continue with current prescription therapy as reflected on the Med list.

## 2012-03-23 ENCOUNTER — Telehealth: Payer: Self-pay | Admitting: *Deleted

## 2012-03-23 ENCOUNTER — Other Ambulatory Visit (INDEPENDENT_AMBULATORY_CARE_PROVIDER_SITE_OTHER): Payer: Self-pay | Admitting: General Surgery

## 2012-03-23 NOTE — Telephone Encounter (Signed)
Pt cleared for hernia surgery. Will forward to Dr Avel Peace.

## 2012-03-23 NOTE — Telephone Encounter (Signed)
Message copied by Antony Odea on Fri Mar 23, 2012  9:59 AM ------      Message from: [place], [place] J      Created: Fri Mar 23, 2012  5:43 AM       Essentially normal.  Marginally low EF is due to his longstanding HTN.  No ischemia.  He is at low risk for his hernia surgery.  Follow up in several months or as otherwise scheduled at the office visit.

## 2012-03-28 ENCOUNTER — Ambulatory Visit (INDEPENDENT_AMBULATORY_CARE_PROVIDER_SITE_OTHER): Payer: BC Managed Care – PPO | Admitting: Nurse Practitioner

## 2012-03-28 ENCOUNTER — Encounter (HOSPITAL_COMMUNITY): Payer: Self-pay

## 2012-03-28 ENCOUNTER — Encounter (HOSPITAL_COMMUNITY): Payer: Self-pay | Admitting: Pharmacy Technician

## 2012-03-28 ENCOUNTER — Encounter: Payer: Self-pay | Admitting: Nurse Practitioner

## 2012-03-28 ENCOUNTER — Encounter (HOSPITAL_COMMUNITY)
Admission: RE | Admit: 2012-03-28 | Discharge: 2012-03-28 | Disposition: A | Discharge status: Home / Self Care | Payer: BC Managed Care – PPO | Source: Ambulatory Visit | Attending: General Surgery | Admitting: General Surgery

## 2012-03-28 VITALS — BP 142/80 | HR 60 | Ht 68.0 in | Wt 193.0 lb

## 2012-03-28 DIAGNOSIS — E785 Hyperlipidemia, unspecified: Secondary | ICD-10-CM

## 2012-03-28 DIAGNOSIS — I10 Essential (primary) hypertension: Secondary | ICD-10-CM

## 2012-03-28 HISTORY — DX: Anxiety disorder, unspecified: F41.9

## 2012-03-28 LAB — LIPID PANEL
Cholesterol: 251 mg/dL — ABNORMAL HIGH (ref 0–200)
HDL: 53.6 mg/dL (ref >39.00)
Total CHOL/HDL Ratio: 5
Triglycerides: 167 mg/dL — ABNORMAL HIGH (ref 0.0–149.0)
VLDL: 33.4 mg/dL (ref 0.0–40.0)

## 2012-03-28 LAB — COMPREHENSIVE METABOLIC PANEL
ALT: 12 U/L (ref 0–53)
AST: 15 U/L (ref 0–37)
Alkaline Phosphatase: 71 U/L (ref 39–117)
CO2: 31 mEq/L (ref 19–32)
Calcium: 9.6 mg/dL (ref 8.4–10.5)
GFR calc Af Amer: >90 mL/min (ref >90)
GFR calc non Af Amer: >90 mL/min (ref >90)
Glucose, Bld: 143 mg/dL — ABNORMAL HIGH (ref 70–99)
Potassium: 3.6 mEq/L (ref 3.5–5.1)
Sodium: 141 mEq/L (ref 135–145)
Total Protein: 6.9 g/dL (ref 6.0–8.3)

## 2012-03-28 LAB — CBC WITH DIFFERENTIAL/PLATELET
Basophils Absolute: 0 10*3/uL (ref 0.0–0.1)
Lymphocytes Relative: 21 % (ref 12–46)
Lymphs Abs: 1.6 10*3/uL (ref 0.7–4.0)
Neutro Abs: 5.2 10*3/uL (ref 1.7–7.7)
Neutrophils Relative %: 71 % (ref 43–77)
Platelets: 175 10*3/uL (ref 150–400)
RBC: 4.34 MIL/uL (ref 4.22–5.81)
RDW: 12.7 % (ref 11.5–15.5)
WBC: 7.4 10*3/uL (ref 4.0–10.5)

## 2012-03-28 LAB — SURGICAL PCR SCREEN: Staphylococcus aureus: NEGATIVE

## 2012-03-28 LAB — HEPATIC FUNCTION PANEL
ALT: 14 U/L (ref 0–53)
AST: 15 U/L (ref 0–37)
Albumin: 4.4 g/dL (ref 3.5–5.2)
Alkaline Phosphatase: 60 U/L (ref 39–117)
Bilirubin, Direct: 0.1 mg/dL (ref 0.0–0.3)
Total Bilirubin: 1.3 mg/dL — ABNORMAL HIGH (ref 0.3–1.2)
Total Protein: 7 g/dL (ref 6.0–8.3)

## 2012-03-28 LAB — LDL CHOLESTEROL, DIRECT: Direct LDL: 169.8 mg/dL

## 2012-03-28 NOTE — Patient Instructions (Signed)
20      Your procedure is scheduled on:  Friday 03/30/2012 at 115 pm  Report to [id number]  AM.  Call this number if you have problems the morning of surgery: [phone]   Remember:   Do not eat food after midnight! FROM MIDNIGHT UP UNTIL 0715 AM YOU MAY HAVE CLEAR LIQUIDS THEN NOTHING AFTER 0715 AM UNTIL AFTER SURGERY1  Take these medicines the morning of surgery with A SIP OF WATER: use Albuterol inhaler if needed   Do not bring valuables to the hospital.  .  Leave suitcase in the car. After surgery it may be brought to your room.  For patients admitted to the hospital, checkout time is 11:00 AM the day of              Discharge.    Special Instructions: See [facility] Preparing  For Surgery Instruction Sheet. Do not wear jewelry, lotions powders, perfumes. Women do not shave legs or underarms for 12 hours before showers. Contacts, partial plates, or dentures may not be worn into surgery.                          Patients discharged the day of surgery will not be allowed to drive home.  If going home the same day of surgery, must have someone stay with you first 24 hrs.at home and arrange for someone to drive you home from the Hospital.              YOUR DRIVER JY:NWGNF -spouse   Please read over the following fact sheets that you were given: MRSA INFORMATION,INCENTIVE SPIROMETRY SHEET, SLEEP APNEA SHEET                            Telford Nab.Demara Lover,RN,BSN     [phone]

## 2012-03-28 NOTE — Progress Notes (Signed)
03/28/12 1223  OBSTRUCTIVE SLEEP APNEA  Have you ever been diagnosed with sleep apnea through a sleep study? No  Do you snore loudly (loud enough to be heard through closed doors)?  1  Do you often feel tired, fatigued, or sleepy during the daytime? 1  Has anyone observed you stop breathing during your sleep? 1  Do you have, or are you being treated for high blood pressure? 1  BMI more than 35 kg/m2? 0  Age over 48 years old? 0  Neck circumference greater than 40 cm/18 inches? 0  Gender: 1  Obstructive Sleep Apnea Score 5   Score 4 or greater  Results sent to PCP

## 2012-03-28 NOTE — Patient Instructions (Addendum)
Continue with your current medicines.  We will check your lipids today.  Ok for your surgery Friday  Monitor your blood pressure at home. I would suggest an Omron machine  Record your readings and bring to your next visit. Your goal BP is less than 135/85 or less  Limit sodium intake.  I would like to see you in about 3 months  Call the [facility] office at [phone] if you have any questions, problems or concerns.
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
## 2012-03-29 MED ORDER — ATORVASTATIN CALCIUM 10 MG PO TABS: 10.0000 mg | ORAL_TABLET | Freq: Every day | ORAL | Status: DC

## 2012-03-29 NOTE — Addendum Note (Signed)
Addended by: Debbe Bales on: 03/29/2012 09:43 AM   Modules accepted: Orders

## 2012-03-30 ENCOUNTER — Ambulatory Visit (HOSPITAL_COMMUNITY)
Admission: RE | Admit: 2012-03-30 | Discharge: 2012-03-30 | Disposition: A | Discharge status: Home / Self Care | Payer: BC Managed Care – PPO | Source: Ambulatory Visit | Attending: General Surgery | Admitting: General Surgery

## 2012-03-30 ENCOUNTER — Encounter (HOSPITAL_COMMUNITY): Payer: Self-pay | Admitting: Anesthesiology

## 2012-03-30 ENCOUNTER — Encounter (HOSPITAL_COMMUNITY)
Admission: RE | Disposition: A | Discharge status: Home / Self Care | Payer: Self-pay | Source: Ambulatory Visit | Attending: General Surgery

## 2012-03-30 ENCOUNTER — Encounter (HOSPITAL_COMMUNITY): Payer: Self-pay | Admitting: *Deleted

## 2012-03-30 ENCOUNTER — Ambulatory Visit (HOSPITAL_COMMUNITY): Payer: BC Managed Care – PPO | Admitting: Anesthesiology

## 2012-03-30 DIAGNOSIS — Z8551 Personal history of malignant neoplasm of bladder: Secondary | ICD-10-CM | POA: Insufficient documentation

## 2012-03-30 DIAGNOSIS — Z01812 Encounter for preprocedural laboratory examination: Secondary | ICD-10-CM | POA: Insufficient documentation

## 2012-03-30 DIAGNOSIS — K4090 Unilateral inguinal hernia, without obstruction or gangrene, not specified as recurrent: Secondary | ICD-10-CM

## 2012-03-30 DIAGNOSIS — J45909 Unspecified asthma, uncomplicated: Secondary | ICD-10-CM | POA: Insufficient documentation

## 2012-03-30 HISTORY — PX: INGUINAL HERNIA REPAIR: SHX194

## 2012-03-30 HISTORY — PX: INSERTION OF MESH: SHX5868

## 2012-03-30 SURGERY — REPAIR, HERNIA, INGUINAL, ADULT
Anesthesia: General | Site: Groin | Laterality: Right | Wound class: Clean

## 2012-03-30 MED ORDER — PROMETHAZINE HCL 25 MG/ML IJ SOLN: 6.2500 mg | INTRAMUSCULAR | Status: DC | PRN

## 2012-03-30 MED ORDER — ACETAMINOPHEN 10 MG/ML IV SOLN
INTRAVENOUS | Status: DC | PRN
  Administered 2012-03-30: 1000 mg via INTRAVENOUS

## 2012-03-30 MED ORDER — BUPIVACAINE 0.25 % ON-Q PUMP SINGLE CATH 300ML
INJECTION | Status: AC
  Filled 2012-03-30: qty 300

## 2012-03-30 MED ORDER — ACETAMINOPHEN 10 MG/ML IV SOLN
INTRAVENOUS | Status: AC
  Filled 2012-03-30: qty 100

## 2012-03-30 MED ORDER — EPHEDRINE SULFATE 50 MG/ML IJ SOLN
INTRAMUSCULAR | Status: DC | PRN
  Administered 2012-03-30: 10 mg via INTRAVENOUS

## 2012-03-30 MED ORDER — BUPIVACAINE 0.5 % ON-Q PUMP SINGLE CATH 100 ML
100.0000 mL | INJECTION | Status: DC
  Administered 2012-03-30: 100 mL
  Filled 2012-03-30: qty 100

## 2012-03-30 MED ORDER — BUPIVACAINE-EPINEPHRINE PF 0.25-1:200000 % IJ SOLN
INTRAMUSCULAR | Status: AC
  Filled 2012-03-30: qty 30

## 2012-03-30 MED ORDER — HYDROMORPHONE HCL PF 1 MG/ML IJ SOLN
0.2500 mg | INTRAMUSCULAR | Status: DC | PRN
  Administered 2012-03-30 (×2): 0.5 mg via INTRAVENOUS

## 2012-03-30 MED ORDER — CEFAZOLIN SODIUM-DEXTROSE 2-3 GM-% IV SOLR
INTRAVENOUS | Status: AC
  Filled 2012-03-30: qty 50

## 2012-03-30 MED ORDER — LIDOCAINE HCL (CARDIAC) 20 MG/ML IV SOLN
INTRAVENOUS | Status: DC | PRN
  Administered 2012-03-30: 50 mg via INTRAVENOUS

## 2012-03-30 MED ORDER — HYDROMORPHONE HCL PF 1 MG/ML IJ SOLN
INTRAMUSCULAR | Status: AC
  Filled 2012-03-30: qty 1

## 2012-03-30 MED ORDER — PROPOFOL 10 MG/ML IV EMUL
INTRAVENOUS | Status: DC | PRN
  Administered 2012-03-30: 200 mg via INTRAVENOUS

## 2012-03-30 MED ORDER — BUPIVACAINE-EPINEPHRINE 0.25% -1:200000 IJ SOLN
INTRAMUSCULAR | Status: DC | PRN
  Administered 2012-03-30: 20 mL

## 2012-03-30 MED ORDER — FENTANYL CITRATE 0.05 MG/ML IJ SOLN
INTRAMUSCULAR | Status: DC | PRN
  Administered 2012-03-30 (×5): 50 ug via INTRAVENOUS

## 2012-03-30 MED ORDER — OXYCODONE-ACETAMINOPHEN 5-325 MG PO TABS: 1.0000 | ORAL_TABLET | ORAL | Status: DC | PRN

## 2012-03-30 MED ORDER — LACTATED RINGERS IV SOLN
INTRAVENOUS | Status: DC
  Administered 2012-03-30: 16:00:00 via INTRAVENOUS
  Administered 2012-03-30: 1000 mL via INTRAVENOUS

## 2012-03-30 MED ORDER — LACTATED RINGERS IV SOLN
INTRAVENOUS | Status: DC | PRN
  Administered 2012-03-30: 13:00:00 via INTRAVENOUS

## 2012-03-30 MED ORDER — CEFAZOLIN SODIUM-DEXTROSE 2-3 GM-% IV SOLR
2.0000 g | INTRAVENOUS | Status: AC
  Administered 2012-03-30: 2 g via INTRAVENOUS

## 2012-03-30 MED ORDER — MIDAZOLAM HCL 5 MG/5ML IJ SOLN
INTRAMUSCULAR | Status: DC | PRN
  Administered 2012-03-30: 2 mg via INTRAVENOUS

## 2012-03-30 SURGICAL SUPPLY — 46 items
APL SKNCLS STERI-STRIP NONHPOA (GAUZE/BANDAGES/DRESSINGS) ×1
BENZOIN TINCTURE PRP APPL 2/3 (GAUZE/BANDAGES/DRESSINGS) ×2 IMPLANT
BLADE HEX COATED 2.75 (ELECTRODE) ×2 IMPLANT
BLADE SURG 15 STRL LF DISP TIS (BLADE) ×1 IMPLANT
BLADE SURG 15 STRL SS (BLADE) ×2
BLADE SURG SZ10 CARB STEEL (BLADE) ×2 IMPLANT
CANISTER SUCTION 2500CC (MISCELLANEOUS) ×2 IMPLANT
CATH KIT ON Q 5IN SLV (PAIN MANAGEMENT) ×1 IMPLANT
CLOTH BEACON ORANGE TIMEOUT ST (SAFETY) ×2 IMPLANT
CLSR STERI-STRIP ANTIMIC 1/2X4 (GAUZE/BANDAGES/DRESSINGS) ×1 IMPLANT
DECANTER SPIKE VIAL GLASS SM (MISCELLANEOUS) ×2 IMPLANT
DRAIN PENROSE 18X1/2 LTX STRL (DRAIN) ×2 IMPLANT
DRAPE INCISE IOBAN 66X45 STRL (DRAPES) ×2 IMPLANT
DRAPE LAPAROTOMY TRNSV 102X78 (DRAPE) ×2 IMPLANT
DRAPE UTILITY XL STRL (DRAPES) ×2 IMPLANT
DRESSING TELFA 8X3 (GAUZE/BANDAGES/DRESSINGS) IMPLANT
DRSG TEGADERM 2-3/8X2-3/4 SM (GAUZE/BANDAGES/DRESSINGS) IMPLANT
DRSG TEGADERM 4X4.75 (GAUZE/BANDAGES/DRESSINGS) ×2 IMPLANT
ELECT REM PT RETURN 9FT ADLT (ELECTROSURGICAL) ×2
ELECTRODE REM PT RTRN 9FT ADLT (ELECTROSURGICAL) ×1 IMPLANT
GLOVE ECLIPSE 8.0 STRL XLNG CF (GLOVE) ×2 IMPLANT
GLOVE INDICATOR 8.0 STRL GRN (GLOVE) ×4 IMPLANT
GOWN STRL NON-REIN LRG LVL3 (GOWN DISPOSABLE) ×2 IMPLANT
GOWN STRL REIN XL XLG (GOWN DISPOSABLE) ×4 IMPLANT
KIT BASIN OR (CUSTOM PROCEDURE TRAY) ×2 IMPLANT
MESH HERNIA 3X6 (Mesh General) ×2 IMPLANT
NDL HYPO 25X1 1.5 SAFETY (NEEDLE) ×1 IMPLANT
NEEDLE HYPO 25X1 1.5 SAFETY (NEEDLE) ×2 IMPLANT
NS IRRIG 1000ML POUR BTL (IV SOLUTION) ×2 IMPLANT
PACK BASIC VI WITH GOWN DISP (CUSTOM PROCEDURE TRAY) ×2 IMPLANT
PENCIL BUTTON HOLSTER BLD 10FT (ELECTRODE) ×2 IMPLANT
PUMP ON Q 100MLX2ML/HR (PAIN MANAGEMENT) ×1 IMPLANT
SPONGE GAUZE 4X4 12PLY (GAUZE/BANDAGES/DRESSINGS) ×2 IMPLANT
SPONGE LAP 4X18 X RAY DECT (DISPOSABLE) ×2 IMPLANT
STRIP CLOSURE SKIN 1/2X4 (GAUZE/BANDAGES/DRESSINGS) ×2 IMPLANT
SUT MNCRL AB 4-0 PS2 18 (SUTURE) ×2 IMPLANT
SUT PROLENE 2 0 CT2 30 (SUTURE) ×4 IMPLANT
SUT VIC AB 2-0 SH 18 (SUTURE) ×2 IMPLANT
SUT VIC AB 3-0 54XBRD REEL (SUTURE) ×1 IMPLANT
SUT VIC AB 3-0 BRD 54 (SUTURE) ×2
SUT VIC AB 3-0 SH 27 (SUTURE) ×4
SUT VIC AB 3-0 SH 27XBRD (SUTURE) ×2 IMPLANT
SYR BULB IRRIGATION 50ML (SYRINGE) ×2 IMPLANT
SYR CONTROL 10ML LL (SYRINGE) ×2 IMPLANT
TOWEL OR 17X26 10 PK STRL BLUE (TOWEL DISPOSABLE) ×2 IMPLANT
YANKAUER SUCT BULB TIP 10FT TU (MISCELLANEOUS) ×2 IMPLANT

## 2012-03-30 NOTE — Interval H&P Note (Signed)
History and Physical Interval Note:  03/30/2012 1:11 PM  Christian [name]  has presented today for surgery, with the diagnosis of right inguinal hernia  The various methods of treatment have been discussed with the patient and family. After consideration of risks, benefits and other options for treatment, the patient has consented to  Procedure(s) (LRB) with comments: HERNIA REPAIR INGUINAL ADULT (Right) - Right Inguinal Hernia Repair with Mesh and On-Q Pump Placement INSERTION OF MESH (Right) as a surgical intervention .  The patient's history has been reviewed, patient examined, no change in status, stable for surgery.  I have reviewed the patient's chart and labs.  Questions were answered to the patient's satisfaction.     Lyla Jasek Shela [place]

## 2012-03-30 NOTE — Transfer of Care (Signed)
Immediate Anesthesia Transfer of Care Note  Patient: Christian [name]  Procedure(s) Performed: Procedure(s) (LRB) with comments: HERNIA REPAIR INGUINAL ADULT (Right) - Right Inguinal Hernia Repair with Mesh and On-Q Pump Placement INSERTION OF MESH (Right)  Patient Location: PACU  Anesthesia Type:General  Level of Consciousness: awake, alert , oriented and patient cooperative  Airway & Oxygen Therapy: Patient Spontanous Breathing and Patient connected to face mask oxygen  Post-op Assessment: Post -op Vital signs reviewed and stable  Post vital signs: Reviewed and stable  Complications: No apparent anesthesia complications
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
## 2012-03-30 NOTE — Op Note (Signed)
Preoperative diagnosis:  Right inguinal hernia.  Postoperative diagnosis:  Same (indirect hernia)  Procedure:  Right inguinal hernia repair with mesh and placement of On-Q pump  Surgeon:  Avel Peace, M.D.  Anesthesia:  General/LMA with local (Marcaine).  Indication:  This is a 51 year old male who noticed a progressing enlarging right groin bulge that became uncomfortable.  He has a right inguinal hernia and now presents for the above procedure.  Technique:  He was seen in the holding room and the right groin was marked with my initials. He patient was brought to the operating, placed supine on the operating table, and the anesthetic was administered by the anesthesiologist. The hair in the groin area was clipped as was felt to be necessary. This area was then sterilely prepped and draped.  Local anesthetic was infiltrated in the superficial and deep tissues in the right groin.  An incision was made through the skin and subcutaneous tissue until the external oblique aponeurosis was identified.  Local anesthetic was infiltrated deep to the external oblique aponeurosis. The external oblique aponeurosis was divided through the external ring medially and back toward the anterior superior iliac spine laterally. Using blunt dissection, the shelving edge of the inguinal ligament was identified inferiorly and the internal oblique aponeurosis and muscle were identified superiorly. The ilioinguinal nerve was identified and preserved.  The spermatic cord was isolated and a posterior window was made around it. The indirect hernia sac was identified and separated from the spermatic cord using blunt and sharp dissection. The hernia sac and its contents were reduced the indirect hernia defect.   A piece of 3" x 6" polypropylene mesh was brought into the field and anchored 1-2 cm medial to the pubic tubercle with 2-0 Prolene suture. The inferior aspect of the mesh was anchored to the shelving edge of the  inguinal ligament with running 2-0 Prolene suture to a level 1-2 cm lateral to the internal ring. A slit was cut in the mesh creating 2 tails. These were wrapped around the spermatic cord. The superior aspect of the mesh was anchored to the internal oblique aponeurosis and muscle with interrupted 2-0 Vicryl sutures. The 2 tails of the mesh were then crossed creating a new internal ring and were anchored to the shelving edge of the inguinal ligament with 2-0 Prolene suture. The tip of a hemostat could be placed through the new aperture. The lateral aspect of the mesh was then tucked deep to the external oblique aponeurosis.  The wound was inspected and hemostasis was adequate.  The On-Q pump catheter was introduced into the wound via a peal away sheath introducer and positioned appropriately.  It was tested and flushed well.   The external oblique aponeurosis was then closed over the mesh, catheter, and cord with running 3-0 Vicryl suture. The subcutaneous tissue was closed with running 3-0 Vicryl suture. The skin closed with a running 4-0 Monocryl subcuticular stitch.  Steri-Strips and a sterile dressing were applied to the On-Q catheter and the skin of the incision.  The procedure was well-tolerated without any apparent complications and the patient was taken to the recovery room in satisfactory condition.
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
## 2012-03-30 NOTE — Anesthesia Postprocedure Evaluation (Signed)
  Anesthesia Post-op Note  Patient: Christian [name]  Procedure(s) Performed: Procedure(s) (LRB): HERNIA REPAIR INGUINAL ADULT (Right) INSERTION OF MESH (Right)  Patient Location: PACU  Anesthesia Type: General  Level of Consciousness: awake and alert   Airway and Oxygen Therapy: Patient Spontanous Breathing  Post-op Pain: mild  Post-op Assessment: Post-op Vital signs reviewed, Patient's Cardiovascular Status Stable, Respiratory Function Stable, Patent Airway and No signs of Nausea or vomiting  Post-op Vital Signs: stable  Complications: No apparent anesthesia complications

## 2012-04-02 ENCOUNTER — Encounter (HOSPITAL_COMMUNITY): Payer: Self-pay | Admitting: General Surgery

## 2012-04-02 ENCOUNTER — Telehealth (INDEPENDENT_AMBULATORY_CARE_PROVIDER_SITE_OTHER): Payer: Self-pay

## 2012-04-02 NOTE — Telephone Encounter (Signed)
Instructed the patient to begin taking 600mg  Ibuprofen scheduled every 6hrs (not prn), and apply ice to the affected area.  Will leave a Rx for Percocet 5/325 #40 at the front desk for his wife to p/u.

## 2012-04-02 NOTE — Telephone Encounter (Signed)
Patient called in stating his pain medicine (Roxicet 5/325) is not working. He said he took on-q pump out yesterday and he is in pain with no relief from Roxicet. Wants something else prescribed. Please advise.Marland KitchenMarland Kitchen

## 2012-04-11 ENCOUNTER — Telehealth: Payer: Self-pay | Admitting: *Deleted

## 2012-04-11 NOTE — Telephone Encounter (Signed)
RECEIVED NOTE STATING HIS INGUINAL SURGERY WAS CANCELLED DUE TO HTN, CHEST PRESSURE, HEAD ACHE WITH VISUAL CHANGES AND EKG CHANGES, I CALLED AND LEFT A MSG FOR PT TO CALL HIS PCP IF HE HASN'T ALREADY SO THESE ISSUES CAN BE ADDRESSED, TOLD HIM TO CALL WITH ANY QUESTIONS, NUMBER PROVIDED.

## 2012-04-18 ENCOUNTER — Ambulatory Visit (INDEPENDENT_AMBULATORY_CARE_PROVIDER_SITE_OTHER): Payer: BC Managed Care – PPO | Admitting: General Surgery

## 2012-04-18 ENCOUNTER — Encounter (INDEPENDENT_AMBULATORY_CARE_PROVIDER_SITE_OTHER): Payer: Self-pay | Admitting: General Surgery

## 2012-04-18 VITALS — BP 126/68 | HR 74 | Temp 97.8°F | Resp 16 | Ht 68.0 in | Wt 197.5 lb

## 2012-04-18 DIAGNOSIS — Z9889 Other specified postprocedural states: Secondary | ICD-10-CM

## 2012-04-18 NOTE — Patient Instructions (Signed)
6 weeks from the date of surgery resume normal activities as tolerated as discussed.

## 2012-04-18 NOTE — Progress Notes (Signed)
He presents for postop followup after open right inguinal hernia repair with mesh.  Post op pain is improving.  No difficulty voiding or having BMs.  Swelling is decreasing.  P.E.  GU:  Right groin incision clean/dry/intact, swelling is minimal, repair is solid.  Assessment:  Doing well post hernia repair.  Plan:  Continue light activities for 6 weeks postop then slowly start to resume normal activities.  Avoid activities that cause significant discomfort for the long term.  Return visit as needed.

## 2012-05-03 ENCOUNTER — Other Ambulatory Visit: Payer: BC Managed Care – PPO

## 2012-05-15 ENCOUNTER — Ambulatory Visit: Payer: BC Managed Care – PPO | Admitting: Internal Medicine

## 2012-06-27 ENCOUNTER — Ambulatory Visit: Payer: BC Managed Care – PPO | Admitting: Nurse Practitioner

## 2012-07-19 ENCOUNTER — Ambulatory Visit (INDEPENDENT_AMBULATORY_CARE_PROVIDER_SITE_OTHER): Payer: BC Managed Care – PPO | Admitting: Internal Medicine

## 2012-07-19 ENCOUNTER — Encounter: Payer: Self-pay | Admitting: Internal Medicine

## 2012-07-19 VITALS — BP 138/90 | HR 80 | Temp 98.0°F | Resp 16 | Wt 211.0 lb

## 2012-07-19 DIAGNOSIS — N529 Male erectile dysfunction, unspecified: Secondary | ICD-10-CM

## 2012-07-19 DIAGNOSIS — F1010 Alcohol abuse, uncomplicated: Secondary | ICD-10-CM

## 2012-07-19 DIAGNOSIS — I10 Essential (primary) hypertension: Secondary | ICD-10-CM

## 2012-07-19 DIAGNOSIS — E785 Hyperlipidemia, unspecified: Secondary | ICD-10-CM

## 2012-07-19 DIAGNOSIS — C679 Malignant neoplasm of bladder, unspecified: Secondary | ICD-10-CM

## 2012-07-19 DIAGNOSIS — F410 Panic disorder [episodic paroxysmal anxiety] without agoraphobia: Secondary | ICD-10-CM

## 2012-07-19 MED ORDER — CLONAZEPAM 1 MG PO TABS: 1.0000 mg | ORAL_TABLET | Freq: Two times a day (BID) | ORAL | Status: DC | PRN

## 2012-07-19 MED ORDER — SILDENAFIL CITRATE 100 MG PO TABS: 100.0000 mg | ORAL_TABLET | ORAL | Status: DC | PRN

## 2012-07-19 NOTE — Progress Notes (Signed)
   Subjective:     HPI   F/u severe pain in the R groin - resolved post-op. F/u HTN, bladder Ca, dyslipidemia. C/o ED C/o anxiety attacks. He gained wt. He hasn't been taking chol and BP meds  BP Readings from Last 3 Encounters:  07/19/12 138/90  04/18/12 126/68  03/30/12 167/91   Wt Readings from Last 3 Encounters:  07/19/12 211 lb (95.709 kg)  04/18/12 197 lb 8 oz (89.585 kg)  03/28/12 192 lb (87.091 kg)      Review of Systems  Constitutional: Negative for appetite change, fatigue and unexpected weight change.  HENT: Negative for nosebleeds, congestion, sore throat, sneezing, trouble swallowing and neck pain.   Eyes: Negative for itching and visual disturbance.  Respiratory: Negative for cough.   Cardiovascular: Negative for chest pain, palpitations and leg swelling.  Gastrointestinal: Negative for nausea, diarrhea, blood in stool and abdominal distention.  Genitourinary: Negative for frequency and hematuria.  Musculoskeletal: Negative for back pain, joint swelling and gait problem.  Skin: Negative for rash.  Neurological: Negative for dizziness, tremors, speech difficulty and weakness.  Psychiatric/Behavioral: Negative for suicidal ideas, sleep disturbance, dysphoric mood and agitation. The patient is nervous/anxious.        Objective:   Physical Exam  Constitutional: He is oriented to person, place, and time. He appears well-developed.  HENT:  Mouth/Throat: Oropharynx is clear and moist.  Eyes: Conjunctivae are normal. Pupils are equal, round, and reactive to light.  Neck: Normal range of motion. No JVD present. No thyromegaly present.  Cardiovascular: Normal rate, regular rhythm, normal heart sounds and intact distal pulses.  Exam reveals no gallop and no friction rub.   No murmur heard. Pulmonary/Chest: Effort normal and breath sounds normal. No respiratory distress. He has no wheezes. He has no rales. He exhibits no tenderness.  Abdominal: Soft. Bowel sounds are  normal. He exhibits mass. He exhibits no distension. There is tenderness. There is no rebound and no guarding.  Musculoskeletal: Normal range of motion. He exhibits no edema and no tenderness.  Lymphadenopathy:    He has no cervical adenopathy.  Neurological: He is alert and oriented to person, place, and time. He has normal reflexes. No cranial nerve deficit. He exhibits normal muscle tone. Coordination normal.  Skin: Skin is warm and dry. No rash noted.  Psychiatric: He has a normal mood and affect. His behavior is normal. Judgment and thought content normal.   Not homicidal or suicidal   Lab Results  Component Value Date   WBC 7.4 03/28/2012   HGB 13.8 03/28/2012   HCT 40.8 03/28/2012   PLT 175 03/28/2012   GLUCOSE 143* 03/28/2012   CHOL 251* 03/28/2012   TRIG 167.0* 03/28/2012   HDL 53.60 03/28/2012   LDLDIRECT 169.8 03/28/2012   ALT 12 03/28/2012   AST 15 03/28/2012   NA 141 03/28/2012   K 3.6 03/28/2012   CL 102 03/28/2012   CREATININE 0.80 03/28/2012   BUN 13 03/28/2012   CO2 31 03/28/2012   TSH 1.42 02/21/2012   INR 1.01 03/28/2012        Assessment & Plan:

## 2012-07-19 NOTE — Assessment & Plan Note (Signed)
3/14 drinking 1-3 drinks on occasion

## 2012-07-19 NOTE — Assessment & Plan Note (Addendum)
He needs to f/u w/Dr Margarita Grizzle - he'll call his office today

## 2012-07-19 NOTE — Assessment & Plan Note (Signed)
On Klonopin prn

## 2012-07-19 NOTE — Assessment & Plan Note (Signed)
Will try Viagra 

## 2012-07-19 NOTE — Assessment & Plan Note (Signed)
He is not taking BP med now

## 2012-11-20 ENCOUNTER — Ambulatory Visit (INDEPENDENT_AMBULATORY_CARE_PROVIDER_SITE_OTHER): Payer: BC Managed Care – PPO | Admitting: Internal Medicine

## 2012-11-20 ENCOUNTER — Other Ambulatory Visit (INDEPENDENT_AMBULATORY_CARE_PROVIDER_SITE_OTHER): Payer: BC Managed Care – PPO

## 2012-11-20 ENCOUNTER — Encounter: Payer: Self-pay | Admitting: Internal Medicine

## 2012-11-20 ENCOUNTER — Other Ambulatory Visit: Payer: Self-pay | Admitting: Internal Medicine

## 2012-11-20 VITALS — BP 120/80 | HR 68 | Temp 98.0°F | Resp 16 | Wt 225.0 lb

## 2012-11-20 DIAGNOSIS — I10 Essential (primary) hypertension: Secondary | ICD-10-CM

## 2012-11-20 DIAGNOSIS — E785 Hyperlipidemia, unspecified: Secondary | ICD-10-CM

## 2012-11-20 DIAGNOSIS — F410 Panic disorder [episodic paroxysmal anxiety] without agoraphobia: Secondary | ICD-10-CM

## 2012-11-20 DIAGNOSIS — C679 Malignant neoplasm of bladder, unspecified: Secondary | ICD-10-CM

## 2012-11-20 DIAGNOSIS — F1010 Alcohol abuse, uncomplicated: Secondary | ICD-10-CM

## 2012-11-20 DIAGNOSIS — R635 Abnormal weight gain: Secondary | ICD-10-CM | POA: Insufficient documentation

## 2012-11-20 DIAGNOSIS — N529 Male erectile dysfunction, unspecified: Secondary | ICD-10-CM

## 2012-11-20 LAB — BASIC METABOLIC PANEL
BUN: 15 mg/dL (ref 6–23)
CO2: 27 mEq/L (ref 19–32)
Chloride: 103 mEq/L (ref 96–112)
Creatinine, Ser: 0.8 mg/dL (ref 0.4–1.5)

## 2012-11-20 LAB — HEPATIC FUNCTION PANEL
ALT: 23 U/L (ref 0–53)
Total Protein: 7.8 g/dL (ref 6.0–8.3)

## 2012-11-20 LAB — LIPID PANEL
Cholesterol: 298 mg/dL — ABNORMAL HIGH (ref 0–200)
Triglycerides: 513 mg/dL — ABNORMAL HIGH (ref 0.0–149.0)

## 2012-11-20 MED ORDER — CLONAZEPAM 1 MG PO TABS: 1.0000 mg | ORAL_TABLET | Freq: Two times a day (BID) | ORAL | Status: DC | PRN

## 2012-11-20 NOTE — Assessment & Plan Note (Signed)
Wt Readings from Last 3 Encounters:  11/20/12 225 lb (102.059 kg)  07/19/12 211 lb (95.709 kg)  04/18/12 197 lb 8 oz (89.585 kg)  diet discussed

## 2012-11-20 NOTE — Progress Notes (Signed)
   Subjective:     HPI   F/u severe pain in the R groin - resolved post-op.  F/u HTN, bladder Ca, dyslipidemia. C/o ED  C/o anxiety attacks. He gained wt. He hasn't been taking chol and BP meds  BP Readings from Last 3 Encounters:  11/20/12 120/80  07/19/12 138/90  04/18/12 126/68   Wt Readings from Last 3 Encounters:  11/20/12 225 lb (102.059 kg)  07/19/12 211 lb (95.709 kg)  04/18/12 197 lb 8 oz (89.585 kg)      Review of Systems  Constitutional: Negative for appetite change, fatigue and unexpected weight change.  HENT: Negative for nosebleeds, congestion, sore throat, sneezing, trouble swallowing and neck pain.   Eyes: Negative for itching and visual disturbance.  Respiratory: Negative for cough.   Cardiovascular: Negative for chest pain, palpitations and leg swelling.  Gastrointestinal: Negative for nausea, diarrhea, blood in stool and abdominal distention.  Genitourinary: Negative for frequency and hematuria.  Musculoskeletal: Negative for back pain, joint swelling and gait problem.  Skin: Negative for rash.  Neurological: Negative for dizziness, tremors, speech difficulty and weakness.  Psychiatric/Behavioral: Negative for suicidal ideas, sleep disturbance, dysphoric mood and agitation. The patient is nervous/anxious.        Objective:   Physical Exam  Constitutional: He is oriented to person, place, and time. He appears well-developed.  HENT:  Mouth/Throat: Oropharynx is clear and moist.  Eyes: Conjunctivae are normal. Pupils are equal, round, and reactive to light.  Neck: Normal range of motion. No JVD present. No thyromegaly present.  Cardiovascular: Normal rate, regular rhythm, normal heart sounds and intact distal pulses.  Exam reveals no gallop and no friction rub.   No murmur heard. Pulmonary/Chest: Effort normal and breath sounds normal. No respiratory distress. He has no wheezes. He has no rales. He exhibits no tenderness.  Abdominal: Soft. Bowel  sounds are normal. He exhibits mass. He exhibits no distension. There is tenderness. There is no rebound and no guarding.  Musculoskeletal: Normal range of motion. He exhibits no edema and no tenderness.  Lymphadenopathy:    He has no cervical adenopathy.  Neurological: He is alert and oriented to person, place, and time. He has normal reflexes. No cranial nerve deficit. He exhibits normal muscle tone. Coordination normal.  Skin: Skin is warm and dry. No rash noted.  Psychiatric: He has a normal mood and affect. His behavior is normal. Judgment and thought content normal.   Not homicidal or suicidal   Lab Results  Component Value Date   WBC 7.4 03/28/2012   HGB 13.8 03/28/2012   HCT 40.8 03/28/2012   PLT 175 03/28/2012   GLUCOSE 143* 03/28/2012   CHOL 251* 03/28/2012   TRIG 167.0* 03/28/2012   HDL 53.60 03/28/2012   LDLDIRECT 169.8 03/28/2012   ALT 12 03/28/2012   AST 15 03/28/2012   NA 141 03/28/2012   K 3.6 03/28/2012   CL 102 03/28/2012   CREATININE 0.80 03/28/2012   BUN 13 03/28/2012   CO2 31 03/28/2012   TSH 1.42 02/21/2012   INR 1.01 03/28/2012        Assessment & Plan:

## 2012-11-20 NOTE — Assessment & Plan Note (Signed)
Continue with current prescription therapy as reflected on the Med list.  

## 2012-11-20 NOTE — Assessment & Plan Note (Signed)
Continue with current prn prescription therapy as reflected on the Med list.  

## 2012-11-20 NOTE — Assessment & Plan Note (Signed)
Continue with current prescription therapy as reflected on the Med list. Labs Loose wt
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
## 2013-05-02 ENCOUNTER — Telehealth: Payer: Self-pay | Admitting: Hematology & Oncology

## 2013-05-02 ENCOUNTER — Telehealth: Payer: Self-pay | Admitting: *Deleted

## 2013-05-02 NOTE — Telephone Encounter (Signed)
I'm confused. Did he mean "Gastroenterollogy for colonoscopy for colon ca screening"? Thx

## 2013-05-02 NOTE — Telephone Encounter (Signed)
Received a call from pt's sister Lupita Leash [phone]. She said PCP was referring pt to [place]. It appears pt was last seen in July. Sister said pt told her he received to certified letters from pcp that he was referring pt to [place],  I showed pathology to Dr. Myna Hidalgo he confirmed pt should be seeing urologist. I called talked with Harriett Sine at pcp office, she would not let me talk to [facility] Dr Plotnikov's CMA. I left message that pt is concerned about referral and to please call me. I left message's with pt to call me. I called sister back and she is aware pt needs to call pcp office.

## 2013-05-02 NOTE — Telephone Encounter (Signed)
Rec a call that pt was expecting a referral from [place] for Oncology from back in July. I can't tell from last OV on 11/20/12.  Please advise.

## 2013-05-02 NOTE — Telephone Encounter (Signed)
error 

## 2013-05-03 NOTE — Telephone Encounter (Signed)
I called pt to clarify- he said to disregard the message about the referral. He will discuss this at his next OV with PCP. I transferred him to scheduler to make OV.

## 2013-05-13 ENCOUNTER — Encounter: Payer: Self-pay | Admitting: Internal Medicine

## 2013-05-13 ENCOUNTER — Ambulatory Visit (INDEPENDENT_AMBULATORY_CARE_PROVIDER_SITE_OTHER): Payer: BC Managed Care – PPO | Admitting: Internal Medicine

## 2013-05-13 ENCOUNTER — Other Ambulatory Visit (INDEPENDENT_AMBULATORY_CARE_PROVIDER_SITE_OTHER): Payer: BC Managed Care – PPO

## 2013-05-13 VITALS — BP 148/90 | HR 72 | Temp 97.8°F | Resp 16 | Wt 233.0 lb

## 2013-05-13 DIAGNOSIS — F4321 Adjustment disorder with depressed mood: Secondary | ICD-10-CM

## 2013-05-13 DIAGNOSIS — C679 Malignant neoplasm of bladder, unspecified: Secondary | ICD-10-CM

## 2013-05-13 DIAGNOSIS — I10 Essential (primary) hypertension: Secondary | ICD-10-CM

## 2013-05-13 DIAGNOSIS — R635 Abnormal weight gain: Secondary | ICD-10-CM

## 2013-05-13 DIAGNOSIS — F1010 Alcohol abuse, uncomplicated: Secondary | ICD-10-CM

## 2013-05-13 DIAGNOSIS — F410 Panic disorder [episodic paroxysmal anxiety] without agoraphobia: Secondary | ICD-10-CM

## 2013-05-13 DIAGNOSIS — E785 Hyperlipidemia, unspecified: Secondary | ICD-10-CM

## 2013-05-13 DIAGNOSIS — F329 Major depressive disorder, single episode, unspecified: Secondary | ICD-10-CM

## 2013-05-13 LAB — BASIC METABOLIC PANEL
BUN: 18 mg/dL (ref 6–23)
CO2: 25 mEq/L (ref 19–32)
Calcium: 9.7 mg/dL (ref 8.4–10.5)
Chloride: 104 mEq/L (ref 96–112)
Creatinine, Ser: 0.9 mg/dL (ref 0.4–1.5)
GFR: 97.23 mL/min (ref >60.00)
Glucose, Bld: 104 mg/dL — ABNORMAL HIGH (ref 70–99)

## 2013-05-13 LAB — HEPATIC FUNCTION PANEL
AST: 23 U/L (ref 0–37)
Alkaline Phosphatase: 66 U/L (ref 39–117)
Bilirubin, Direct: 0.1 mg/dL (ref 0.0–0.3)
Total Bilirubin: 1.1 mg/dL (ref 0.3–1.2)
Total Protein: 7.4 g/dL (ref 6.0–8.3)

## 2013-05-13 LAB — URINALYSIS
Ketones, ur: NEGATIVE
Leukocytes, UA: NEGATIVE
Specific Gravity, Urine: 1.025 (ref 1.000–1.030)
pH: 5.5 (ref 5.0–8.0)

## 2013-05-13 LAB — LIPID PANEL
Total CHOL/HDL Ratio: 7
Triglycerides: 519 mg/dL — ABNORMAL HIGH (ref 0.0–149.0)

## 2013-05-13 LAB — TSH: TSH: 2.15 u[IU]/mL (ref 0.35–5.50)

## 2013-05-13 MED ORDER — ASPIRIN 325 MG PO TABS: 325.0000 mg | ORAL_TABLET | Freq: Every day | ORAL | Status: DC

## 2013-05-13 MED ORDER — VITAMIN D 1000 UNITS PO TABS: 1000.0000 [IU] | ORAL_TABLET | Freq: Every day | ORAL | Status: AC

## 2013-05-13 NOTE — Assessment & Plan Note (Signed)
Discussed.

## 2013-05-13 NOTE — Assessment & Plan Note (Signed)
12/14 pt refused a Urol f/u UA and Cytol test

## 2013-05-13 NOTE — Assessment & Plan Note (Addendum)
BP Readings from Last 3 Encounters:  05/13/13 148/90  11/20/12 120/80  07/19/12 138/90   Restart Lisinopril. Loose wt

## 2013-05-13 NOTE — Assessment & Plan Note (Signed)
Wt Readings from Last 3 Encounters:  05/13/13 233 lb (105.688 kg)  11/20/12 225 lb (102.059 kg)  07/19/12 211 lb (95.709 kg)

## 2013-05-13 NOTE — Progress Notes (Signed)
Pre visit review using our clinic review tool, if applicable. No additional management support is needed unless otherwise documented below in the visit note. 

## 2013-05-13 NOTE — Progress Notes (Signed)
   Subjective:     HPI   F/u severe pain in the R groin - resolved   F/u HTN, bladder Ca, dyslipidemia. F/u ED  F/u anxiety attacks. He gained wt. He hasn't been taking chol and BP meds  BP Readings from Last 3 Encounters:  05/13/13 148/90  11/20/12 120/80  07/19/12 138/90   Wt Readings from Last 3 Encounters:  05/13/13 233 lb (105.688 kg)  11/20/12 225 lb (102.059 kg)  07/19/12 211 lb (95.709 kg)      Review of Systems  Constitutional: Negative for appetite change, fatigue and unexpected weight change.  HENT: Negative for congestion, nosebleeds, sneezing, sore throat and trouble swallowing.   Eyes: Negative for itching and visual disturbance.  Respiratory: Negative for cough.   Cardiovascular: Negative for chest pain, palpitations and leg swelling.  Gastrointestinal: Negative for nausea, diarrhea, blood in stool and abdominal distention.  Genitourinary: Negative for frequency and hematuria.  Musculoskeletal: Negative for back pain, gait problem, joint swelling and neck pain.  Skin: Negative for rash.  Neurological: Negative for dizziness, tremors, speech difficulty and weakness.  Psychiatric/Behavioral: Negative for suicidal ideas, sleep disturbance, dysphoric mood and agitation. The patient is nervous/anxious.        Objective:   Physical Exam  Constitutional: He is oriented to person, place, and time. He appears well-developed.  HENT:  Mouth/Throat: Oropharynx is clear and moist.  Eyes: Conjunctivae are normal. Pupils are equal, round, and reactive to light.  Neck: Normal range of motion. No JVD present. No thyromegaly present.  Cardiovascular: Normal rate, regular rhythm, normal heart sounds and intact distal pulses.  Exam reveals no gallop and no friction rub.   No murmur heard. Pulmonary/Chest: Effort normal and breath sounds normal. No respiratory distress. He has no wheezes. He has no rales. He exhibits no tenderness.  Abdominal: Soft. Bowel sounds are  normal. He exhibits mass. He exhibits no distension. There is tenderness. There is no rebound and no guarding.  Musculoskeletal: Normal range of motion. He exhibits no edema and no tenderness.  Lymphadenopathy:    He has no cervical adenopathy.  Neurological: He is alert and oriented to person, place, and time. He has normal reflexes. No cranial nerve deficit. He exhibits normal muscle tone. Coordination normal.  Skin: Skin is warm and dry. No rash noted.  Psychiatric: He has a normal mood and affect. His behavior is normal. Judgment and thought content normal.   Not homicidal or suicidal   Lab Results  Component Value Date   WBC 7.4 03/28/2012   HGB 13.8 03/28/2012   HCT 40.8 03/28/2012   PLT 175 03/28/2012   GLUCOSE 96 11/20/2012   CHOL 298* 11/20/2012   TRIG 513.0 Triglyceride is over 400; calculations on Lipids are invalid.* 11/20/2012   HDL 44.40 11/20/2012   LDLDIRECT 181.1 11/20/2012   ALT 23 11/20/2012   AST 22 11/20/2012   NA 139 11/20/2012   K 4.3 11/20/2012   CL 103 11/20/2012   CREATININE 0.8 11/20/2012   BUN 15 11/20/2012   CO2 27 11/20/2012   TSH 1.68 11/20/2012   INR 1.01 03/28/2012        Assessment & Plan:

## 2013-05-13 NOTE — Assessment & Plan Note (Signed)
Start ASA, Vit D

## 2013-05-13 NOTE — Assessment & Plan Note (Signed)
Doing well 

## 2013-05-13 NOTE — Assessment & Plan Note (Signed)
3/14 drinking 1-3 drinks on occasion much less than before 12/14 drinking more

## 2013-06-24 ENCOUNTER — Other Ambulatory Visit: Payer: Self-pay | Admitting: Internal Medicine

## 2013-06-25 NOTE — Telephone Encounter (Signed)
Refill request for clonazepam  Last filled by MD on - 11/20/12 #60 x3 Last Appt: 04/23/13 Next Appt: 11/11/13 Please advise refill?

## 2013-07-12 ENCOUNTER — Other Ambulatory Visit: Payer: Self-pay | Admitting: Internal Medicine

## 2013-09-13 ENCOUNTER — Other Ambulatory Visit: Payer: Self-pay | Admitting: Cardiovascular Disease

## 2013-09-13 ENCOUNTER — Other Ambulatory Visit: Payer: Self-pay | Admitting: Internal Medicine

## 2013-10-31 ENCOUNTER — Other Ambulatory Visit: Payer: Self-pay | Admitting: Cardiovascular Disease

## 2013-10-31 ENCOUNTER — Other Ambulatory Visit: Payer: Self-pay | Admitting: Internal Medicine

## 2013-11-04 ENCOUNTER — Other Ambulatory Visit: Payer: Self-pay | Admitting: Internal Medicine

## 2013-11-11 ENCOUNTER — Encounter: Payer: Self-pay | Admitting: Internal Medicine

## 2013-11-11 ENCOUNTER — Ambulatory Visit (INDEPENDENT_AMBULATORY_CARE_PROVIDER_SITE_OTHER): Payer: BC Managed Care – PPO | Admitting: Internal Medicine

## 2013-11-11 ENCOUNTER — Other Ambulatory Visit (INDEPENDENT_AMBULATORY_CARE_PROVIDER_SITE_OTHER): Payer: BC Managed Care – PPO

## 2013-11-11 ENCOUNTER — Other Ambulatory Visit: Payer: Self-pay | Admitting: *Deleted

## 2013-11-11 VITALS — BP 192/100 | HR 76 | Temp 98.3°F | Resp 16 | Ht 68.0 in | Wt 213.0 lb

## 2013-11-11 DIAGNOSIS — Z0000 Encounter for general adult medical examination without abnormal findings: Secondary | ICD-10-CM

## 2013-11-11 DIAGNOSIS — F410 Panic disorder [episodic paroxysmal anxiety] without agoraphobia: Secondary | ICD-10-CM

## 2013-11-11 DIAGNOSIS — I10 Essential (primary) hypertension: Secondary | ICD-10-CM

## 2013-11-11 DIAGNOSIS — Z0389 Encounter for observation for other suspected diseases and conditions ruled out: Secondary | ICD-10-CM

## 2013-11-11 DIAGNOSIS — R635 Abnormal weight gain: Secondary | ICD-10-CM

## 2013-11-11 DIAGNOSIS — F1010 Alcohol abuse, uncomplicated: Secondary | ICD-10-CM

## 2013-11-11 LAB — URINALYSIS, ROUTINE W REFLEX MICROSCOPIC
BILIRUBIN URINE: NEGATIVE
HGB URINE DIPSTICK: NEGATIVE
Ketones, ur: NEGATIVE
Nitrite: NEGATIVE
PH: 5.5 (ref 5.0–8.0)
RBC / HPF: NONE SEEN (ref 0–?)
SPECIFIC GRAVITY, URINE: 1.02 (ref 1.000–1.030)
Total Protein, Urine: NEGATIVE
Urine Glucose: NEGATIVE
Urobilinogen, UA: 0.2 (ref 0.0–1.0)

## 2013-11-11 LAB — CBC WITH DIFFERENTIAL/PLATELET
BASOS ABS: 0 10*3/uL (ref 0.0–0.1)
Basophils Relative: 0.4 % (ref 0.0–3.0)
Eosinophils Absolute: 0.4 10*3/uL (ref 0.0–0.7)
Eosinophils Relative: 5.9 % — ABNORMAL HIGH (ref 0.0–5.0)
HEMATOCRIT: 42.8 % (ref 39.0–52.0)
Hemoglobin: 14.5 g/dL (ref 13.0–17.0)
LYMPHS ABS: 1.9 10*3/uL (ref 0.7–4.0)
LYMPHS PCT: 26.5 % (ref 12.0–46.0)
MCHC: 33.9 g/dL (ref 30.0–36.0)
MCV: 95.9 fl (ref 78.0–100.0)
Monocytes Absolute: 0.4 10*3/uL (ref 0.1–1.0)
Monocytes Relative: 5.7 % (ref 3.0–12.0)
NEUTROS ABS: 4.5 10*3/uL (ref 1.4–7.7)
Neutrophils Relative %: 61.5 % (ref 43.0–77.0)
Platelets: 160 10*3/uL (ref 150.0–400.0)
RBC: 4.46 Mil/uL (ref 4.22–5.81)
RDW: 12.7 % (ref 11.5–15.5)
WBC: 7.3 10*3/uL (ref 4.0–10.5)

## 2013-11-11 LAB — HEPATIC FUNCTION PANEL
ALBUMIN: 4.7 g/dL (ref 3.5–5.2)
ALT: 20 U/L (ref 0–53)
AST: 22 U/L (ref 0–37)
Alkaline Phosphatase: 53 U/L (ref 39–117)
BILIRUBIN TOTAL: 1.2 mg/dL (ref 0.2–1.2)
Bilirubin, Direct: 0.2 mg/dL (ref 0.0–0.3)
Total Protein: 7.4 g/dL (ref 6.0–8.3)

## 2013-11-11 LAB — BASIC METABOLIC PANEL
BUN: 13 mg/dL (ref 6–23)
CHLORIDE: 104 meq/L (ref 96–112)
CO2: 28 mEq/L (ref 19–32)
Calcium: 9.5 mg/dL (ref 8.4–10.5)
Creatinine, Ser: 0.8 mg/dL (ref 0.4–1.5)
GFR: 106.77 mL/min (ref >60.00)
Glucose, Bld: 93 mg/dL (ref 70–99)
POTASSIUM: 4.2 meq/L (ref 3.5–5.1)
SODIUM: 139 meq/L (ref 135–145)

## 2013-11-11 LAB — LIPID PANEL
CHOLESTEROL: 278 mg/dL — AB (ref 0–200)
HDL: 46.3 mg/dL (ref >39.00)
LDL Cholesterol: 178 mg/dL — ABNORMAL HIGH (ref 0–99)
NonHDL: 231.7
Total CHOL/HDL Ratio: 6
Triglycerides: 271 mg/dL — ABNORMAL HIGH (ref 0.0–149.0)
VLDL: 54.2 mg/dL — ABNORMAL HIGH (ref 0.0–40.0)

## 2013-11-11 LAB — TSH: TSH: 1.37 u[IU]/mL (ref 0.35–4.50)

## 2013-11-11 LAB — PSA: PSA: 1.07 ng/mL (ref 0.10–4.00)

## 2013-11-11 MED ORDER — CLONAZEPAM 1 MG PO TABS: ORAL_TABLET | ORAL | Status: DC

## 2013-11-11 MED ORDER — ALBUTEROL SULFATE 108 (90 BASE) MCG/ACT IN AEPB: 1.0000 | INHALATION_SPRAY | Freq: Four times a day (QID) | RESPIRATORY_TRACT | Status: DC | PRN

## 2013-11-11 MED ORDER — LISINOPRIL 10 MG PO TABS: 10.0000 mg | ORAL_TABLET | Freq: Every morning | ORAL | Status: DC

## 2013-11-11 NOTE — Assessment & Plan Note (Addendum)
6/15 We discussed age appropriate health related issues, including available/recomended screening tests and vaccinations. We discussed a need for adhering to healthy diet and exercise. Labs/EKG were reviewed/ordered. All questions were answered.  He agreed to [employer] test

## 2013-11-11 NOTE — Assessment & Plan Note (Signed)
He has been out of Rx - re-start

## 2013-11-11 NOTE — Progress Notes (Signed)
   Subjective:    HPI   The patient is here for a wellness exam. The patient has been doing well overall without major physical or psychological issues going on lately.  C/o anxiety attacks  BP Readings from Last 3 Encounters:  11/11/13 192/100  05/13/13 148/90  11/20/12 120/80   Wt Readings from Last 3 Encounters:  11/11/13 213 lb (96.616 kg)  05/13/13 233 lb (105.688 kg)  11/20/12 225 lb (102.059 kg)      Review of Systems  Constitutional: Negative for appetite change, fatigue and unexpected weight change.  HENT: Negative for congestion, nosebleeds, sneezing, sore throat and trouble swallowing.   Eyes: Negative for itching and visual disturbance.  Respiratory: Negative for cough.   Cardiovascular: Negative for chest pain, palpitations and leg swelling.  Gastrointestinal: Negative for nausea, diarrhea, blood in stool and abdominal distention.  Genitourinary: Negative for frequency and hematuria.  Musculoskeletal: Negative for back pain, gait problem, joint swelling and neck pain.  Skin: Negative for rash.  Neurological: Negative for dizziness, tremors, speech difficulty and weakness.  Psychiatric/Behavioral: Negative for suicidal ideas, sleep disturbance, dysphoric mood and agitation. The patient is nervous/anxious.        Objective:   Physical Exam  Constitutional: He is oriented to person, place, and time. He appears well-developed.  HENT:  Mouth/Throat: Oropharynx is clear and moist.  Eyes: Conjunctivae are normal. Pupils are equal, round, and reactive to light.  Neck: Normal range of motion. No JVD present. No thyromegaly present.  Cardiovascular: Normal rate, regular rhythm, normal heart sounds and intact distal pulses.  Exam reveals no gallop and no friction rub.   No murmur heard. Pulmonary/Chest: Effort normal and breath sounds normal. No respiratory distress. He has no wheezes. He has no rales. He exhibits no tenderness.  Abdominal: Soft. Bowel sounds are  normal. He exhibits mass. He exhibits no distension. There is tenderness. There is no rebound and no guarding.  Genitourinary: Rectum normal, prostate normal and penis normal. Guaiac negative stool. No penile tenderness.  Musculoskeletal: Normal range of motion. He exhibits no edema and no tenderness.  Lymphadenopathy:    He has no cervical adenopathy.  Neurological: He is alert and oriented to person, place, and time. He has normal reflexes. No cranial nerve deficit. He exhibits normal muscle tone. Coordination normal.  Skin: Skin is warm and dry. No rash noted.  Psychiatric: He has a normal mood and affect. His behavior is normal. Judgment and thought content normal.   Lab Results  Component Value Date   WBC 7.4 03/28/2012   HGB 13.8 03/28/2012   HCT 40.8 03/28/2012   PLT 175 03/28/2012   GLUCOSE 104* 05/13/2013   CHOL 292* 05/13/2013   TRIG 519.0* 05/13/2013   HDL 42.10 05/13/2013   LDLDIRECT 183.0 05/13/2013   ALT 32 05/13/2013   AST 23 05/13/2013   NA 140 05/13/2013   K 4.5 05/13/2013   CL 104 05/13/2013   CREATININE 0.9 05/13/2013   BUN 18 05/13/2013   CO2 25 05/13/2013   TSH 2.15 05/13/2013   INR 1.01 03/28/2012      Chart reviewed     Assessment & Plan:

## 2013-11-11 NOTE — Assessment & Plan Note (Signed)
Continue with current prn prescription therapy as reflected on the Med list - not w/ETOH

## 2013-11-11 NOTE — Progress Notes (Signed)
Pre visit review using our clinic review tool, if applicable. No additional management support is needed unless otherwise documented below in the visit note. 

## 2013-11-11 NOTE — Assessment & Plan Note (Addendum)
Drinking socially - much better per pt Discussed

## 2013-11-11 NOTE — Assessment & Plan Note (Signed)
Wt Readings from Last 3 Encounters:  11/11/13 213 lb (96.616 kg)  05/13/13 233 lb (105.688 kg)  11/20/12 225 lb (102.059 kg)

## 2013-11-12 ENCOUNTER — Telehealth: Payer: Self-pay | Admitting: Internal Medicine

## 2013-11-12 NOTE — Telephone Encounter (Signed)
Relevant patient education assigned to patient using Emmi. ° °

## 2013-11-14 ENCOUNTER — Other Ambulatory Visit: Payer: Self-pay | Admitting: Internal Medicine

## 2013-11-21 ENCOUNTER — Other Ambulatory Visit: Payer: Self-pay | Admitting: *Deleted

## 2013-11-21 DIAGNOSIS — Z79899 Other long term (current) drug therapy: Secondary | ICD-10-CM

## 2013-11-21 DIAGNOSIS — E785 Hyperlipidemia, unspecified: Secondary | ICD-10-CM

## 2013-11-21 NOTE — Telephone Encounter (Signed)
OK ROV w/lipids and LFTs in 3 mo Thx

## 2013-11-21 NOTE — Telephone Encounter (Signed)
Notified pt of lab results. Pt agreed to start taking the lipitor for his cholesterol. [facility] [place]...Christian [name]

## 2013-11-22 MED ORDER — ATORVASTATIN CALCIUM 10 MG PO TABS: 10.0000 mg | ORAL_TABLET | Freq: Every day | ORAL | Status: DC

## 2013-11-22 NOTE — Telephone Encounter (Signed)
Called pt no answer LMOM need to make 3 month f/u labs prior to appt. Labs already been entered...Christian [name]

## 2013-12-29 LAB — COLOGUARD: Cologuard: NEGATIVE

## 2014-04-17 ENCOUNTER — Encounter: Payer: Self-pay | Admitting: Internal Medicine

## 2014-05-13 ENCOUNTER — Ambulatory Visit (INDEPENDENT_AMBULATORY_CARE_PROVIDER_SITE_OTHER): Payer: BC Managed Care – PPO | Admitting: Internal Medicine

## 2014-05-13 ENCOUNTER — Other Ambulatory Visit (INDEPENDENT_AMBULATORY_CARE_PROVIDER_SITE_OTHER): Payer: BC Managed Care – PPO

## 2014-05-13 ENCOUNTER — Encounter: Payer: Self-pay | Admitting: Internal Medicine

## 2014-05-13 ENCOUNTER — Other Ambulatory Visit: Payer: Self-pay

## 2014-05-13 VITALS — BP 120/82 | HR 67 | Temp 98.1°F | Wt 234.0 lb

## 2014-05-13 DIAGNOSIS — E785 Hyperlipidemia, unspecified: Secondary | ICD-10-CM

## 2014-05-13 DIAGNOSIS — I517 Cardiomegaly: Secondary | ICD-10-CM

## 2014-05-13 DIAGNOSIS — R635 Abnormal weight gain: Secondary | ICD-10-CM

## 2014-05-13 DIAGNOSIS — F410 Panic disorder [episodic paroxysmal anxiety] without agoraphobia: Secondary | ICD-10-CM

## 2014-05-13 DIAGNOSIS — F1010 Alcohol abuse, uncomplicated: Secondary | ICD-10-CM

## 2014-05-13 DIAGNOSIS — I10 Essential (primary) hypertension: Secondary | ICD-10-CM

## 2014-05-13 LAB — BASIC METABOLIC PANEL
BUN: 19 mg/dL (ref 6–23)
CHLORIDE: 103 meq/L (ref 96–112)
CO2: 27 mEq/L (ref 19–32)
Calcium: 9.9 mg/dL (ref 8.4–10.5)
Creatinine, Ser: 0.8 mg/dL (ref 0.4–1.5)
GFR: 108.1 mL/min (ref >60.00)
GLUCOSE: 107 mg/dL — AB (ref 70–99)
Potassium: 4.6 mEq/L (ref 3.5–5.1)
Sodium: 138 mEq/L (ref 135–145)

## 2014-05-13 LAB — HEPATIC FUNCTION PANEL
ALBUMIN: 4.7 g/dL (ref 3.5–5.2)
ALT: 36 U/L (ref 0–53)
AST: 33 U/L (ref 0–37)
Alkaline Phosphatase: 66 U/L (ref 39–117)
Bilirubin, Direct: 0.1 mg/dL (ref 0.0–0.3)
Total Bilirubin: 1 mg/dL (ref 0.2–1.2)
Total Protein: 8 g/dL (ref 6.0–8.3)

## 2014-05-13 LAB — LIPID PANEL
CHOL/HDL RATIO: 7
Cholesterol: 288 mg/dL — ABNORMAL HIGH (ref 0–200)
HDL: 38.9 mg/dL — AB (ref >39.00)
NONHDL: 249.1
Triglycerides: 446 mg/dL — ABNORMAL HIGH (ref 0.0–149.0)
VLDL: 89.2 mg/dL — AB (ref 0.0–40.0)

## 2014-05-13 LAB — TSH: TSH: 2.51 u[IU]/mL (ref 0.35–4.50)

## 2014-05-13 LAB — CK: Total CK: 89 U/L (ref 7–232)

## 2014-05-13 LAB — LDL CHOLESTEROL, DIRECT: LDL DIRECT: 162.9 mg/dL

## 2014-05-13 LAB — HEMOGLOBIN A1C: Hgb A1c MFr Bld: 5.5 % (ref 4.6–6.5)

## 2014-05-13 MED ORDER — CLONAZEPAM 1 MG PO TABS: 1.0000 mg | ORAL_TABLET | Freq: Two times a day (BID) | ORAL | Status: DC | PRN

## 2014-05-13 MED ORDER — SILDENAFIL CITRATE 100 MG PO TABS: ORAL_TABLET | ORAL | Status: DC

## 2014-05-13 MED ORDER — LISINOPRIL 10 MG PO TABS: 10.0000 mg | ORAL_TABLET | Freq: Every morning | ORAL | Status: DC

## 2014-05-13 MED ORDER — ALBUTEROL SULFATE HFA 108 (90 BASE) MCG/ACT IN AERS: 2.0000 | INHALATION_SPRAY | Freq: Four times a day (QID) | RESPIRATORY_TRACT | Status: DC | PRN

## 2014-05-13 MED ORDER — ATORVASTATIN CALCIUM 10 MG PO TABS: 10.0000 mg | ORAL_TABLET | Freq: Every day | ORAL | Status: DC

## 2014-05-13 NOTE — Assessment & Plan Note (Signed)
Continue with current prescription therapy as reflected on the Med list. Loose wt

## 2014-05-13 NOTE — Assessment & Plan Note (Signed)
Continue with current prescription therapy as reflected on the Med list.  

## 2014-05-13 NOTE — Progress Notes (Signed)
   Subjective:    HPI    F/u anxiety attacks, HTN, dyslipidemia  BP Readings from Last 3 Encounters:  05/13/14 120/82  11/11/13 192/100  05/13/13 148/90   Wt Readings from Last 3 Encounters:  05/13/14 234 lb (106.142 kg)  11/11/13 213 lb (96.616 kg)  05/13/13 233 lb (105.688 kg)      Review of Systems  Constitutional: Negative for appetite change, fatigue and unexpected weight change.  HENT: Negative for congestion, nosebleeds, sneezing, sore throat and trouble swallowing.   Eyes: Negative for itching and visual disturbance.  Respiratory: Negative for cough.   Cardiovascular: Negative for chest pain, palpitations and leg swelling.  Gastrointestinal: Negative for nausea, diarrhea, blood in stool and abdominal distention.  Genitourinary: Negative for frequency and hematuria.  Musculoskeletal: Negative for back pain, joint swelling, gait problem and neck pain.  Skin: Negative for rash.  Neurological: Negative for dizziness, tremors, speech difficulty and weakness.  Psychiatric/Behavioral: Negative for suicidal ideas, sleep disturbance, dysphoric mood and agitation. The patient is nervous/anxious.        Objective:   Physical Exam  Constitutional: He is oriented to person, place, and time. He appears well-developed. No distress.  NAD  HENT:  Mouth/Throat: Oropharynx is clear and moist.  Eyes: Conjunctivae are normal. Pupils are equal, round, and reactive to light.  Neck: Normal range of motion. No JVD present. No thyromegaly present.  Cardiovascular: Normal rate, regular rhythm, normal heart sounds and intact distal pulses.  Exam reveals no gallop and no friction rub.   No murmur heard. Pulmonary/Chest: Effort normal and breath sounds normal. No respiratory distress. He has no wheezes. He has no rales. He exhibits no tenderness.  Abdominal: Soft. Bowel sounds are normal. He exhibits no distension and no mass. There is no tenderness. There is no rebound and no guarding.   Musculoskeletal: Normal range of motion. He exhibits no edema or tenderness.  Lymphadenopathy:    He has no cervical adenopathy.  Neurological: He is alert and oriented to person, place, and time. He has normal reflexes. No cranial nerve deficit. He exhibits normal muscle tone. He displays a negative Romberg sign. Coordination and gait normal.  No meningeal signs  Skin: Skin is warm and dry. No rash noted.  Psychiatric: He has a normal mood and affect. His behavior is normal. Judgment and thought content normal.  Obese   Lab Results  Component Value Date   WBC 7.3 11/11/2013   HGB 14.5 11/11/2013   HCT 42.8 11/11/2013   PLT 160.0 11/11/2013   GLUCOSE 93 11/11/2013   CHOL 278* 11/11/2013   TRIG 271.0* 11/11/2013   HDL 46.30 11/11/2013   LDLDIRECT 183.0 05/13/2013   LDLCALC 178* 11/11/2013   ALT 20 11/11/2013   AST 22 11/11/2013   NA 139 11/11/2013   K 4.2 11/11/2013   CL 104 11/11/2013   CREATININE 0.8 11/11/2013   BUN 13 11/11/2013   CO2 28 11/11/2013   TSH 1.37 11/11/2013   PSA 1.07 11/11/2013   INR 1.01 03/28/2012      Chart reviewed     Assessment & Plan:

## 2014-05-13 NOTE — Progress Notes (Signed)
Pre visit review using our clinic review tool, if applicable. No additional management support is needed unless otherwise documented below in the visit note. 

## 2014-05-13 NOTE — Assessment & Plan Note (Addendum)
Drinking "some". Discussed - he is planning to quit

## 2014-05-13 NOTE — Assessment & Plan Note (Signed)
Low carb diet discussed 

## 2014-05-13 NOTE — Assessment & Plan Note (Signed)
Klonopin - occasional use prn - not w/alcohol  Potential benefits of a long term benzodiazepines  use as well as potential risks  and complications were explained to the patient and were aknowledged.

## 2014-05-14 ENCOUNTER — Telehealth: Payer: Self-pay | Admitting: Internal Medicine

## 2014-05-14 MED ORDER — CLONAZEPAM 1 MG PO TABS: 1.0000 mg | ORAL_TABLET | Freq: Two times a day (BID) | ORAL | Status: DC | PRN

## 2014-05-14 NOTE — Telephone Encounter (Signed)
Ok Thx 

## 2014-05-14 NOTE — Telephone Encounter (Signed)
Called pt no answer LMOM [facility].../lmb

## 2014-05-14 NOTE — Telephone Encounter (Signed)
[facility] called [facility] and said that they need a [facility] scripted for this pt Clonazepam 1mg 

## 2014-05-21 ENCOUNTER — Telehealth: Payer: Self-pay | Admitting: Internal Medicine

## 2014-05-21 NOTE — Telephone Encounter (Signed)
Patient states he has came down with a cold and does not want to come in since he already came in for a different issue a week or so ago.  I did notify patient he would need to be seen to get a prescription but told him I would send a message back to verify.

## 2014-05-21 NOTE — Telephone Encounter (Signed)
Pt called to check up on this request.  °

## 2014-05-22 MED ORDER — AZITHROMYCIN 250 MG PO TABS: ORAL_TABLET | ORAL | Status: DC

## 2014-05-22 NOTE — Telephone Encounter (Signed)
Notified pt with md response.../lmb 

## 2014-05-22 NOTE — Telephone Encounter (Signed)
See below Thx

## 2014-05-22 NOTE — Telephone Encounter (Signed)
OK Zpac Thx 

## 2014-11-19 ENCOUNTER — Encounter: Payer: BC Managed Care – PPO | Admitting: Internal Medicine

## 2014-11-20 ENCOUNTER — Other Ambulatory Visit (INDEPENDENT_AMBULATORY_CARE_PROVIDER_SITE_OTHER): Payer: BLUE CROSS/BLUE SHIELD

## 2014-11-20 ENCOUNTER — Encounter: Payer: Self-pay | Admitting: Internal Medicine

## 2014-11-20 ENCOUNTER — Ambulatory Visit (INDEPENDENT_AMBULATORY_CARE_PROVIDER_SITE_OTHER): Payer: BLUE CROSS/BLUE SHIELD | Admitting: Internal Medicine

## 2014-11-20 VITALS — BP 150/108 | HR 64 | Ht 69.0 in | Wt 237.0 lb

## 2014-11-20 DIAGNOSIS — Z0000 Encounter for general adult medical examination without abnormal findings: Secondary | ICD-10-CM | POA: Diagnosis not present

## 2014-11-20 DIAGNOSIS — R7989 Other specified abnormal findings of blood chemistry: Secondary | ICD-10-CM | POA: Diagnosis not present

## 2014-11-20 LAB — HEPATIC FUNCTION PANEL
ALBUMIN: 4.7 g/dL (ref 3.5–5.2)
ALT: 32 U/L (ref 0–53)
AST: 25 U/L (ref 0–37)
Alkaline Phosphatase: 68 U/L (ref 39–117)
BILIRUBIN TOTAL: 0.8 mg/dL (ref 0.2–1.2)
Bilirubin, Direct: 0.1 mg/dL (ref 0.0–0.3)
Total Protein: 7.8 g/dL (ref 6.0–8.3)

## 2014-11-20 LAB — CBC WITH DIFFERENTIAL/PLATELET
BASOS PCT: 0.6 % (ref 0.0–3.0)
Basophils Absolute: 0 10*3/uL (ref 0.0–0.1)
Eosinophils Absolute: 0.5 10*3/uL (ref 0.0–0.7)
Eosinophils Relative: 6.4 % — ABNORMAL HIGH (ref 0.0–5.0)
HCT: 40.2 % (ref 39.0–52.0)
HEMOGLOBIN: 13.9 g/dL (ref 13.0–17.0)
LYMPHS ABS: 2 10*3/uL (ref 0.7–4.0)
LYMPHS PCT: 23.9 % (ref 12.0–46.0)
MCHC: 34.7 g/dL (ref 30.0–36.0)
MCV: 95.1 fl (ref 78.0–100.0)
MONO ABS: 0.5 10*3/uL (ref 0.1–1.0)
MONOS PCT: 6 % (ref 3.0–12.0)
NEUTROS PCT: 63.1 % (ref 43.0–77.0)
Neutro Abs: 5.3 10*3/uL (ref 1.4–7.7)
Platelets: 168 10*3/uL (ref 150.0–400.0)
RBC: 4.23 Mil/uL (ref 4.22–5.81)
RDW: 14 % (ref 11.5–15.5)
WBC: 8.3 10*3/uL (ref 4.0–10.5)

## 2014-11-20 LAB — BASIC METABOLIC PANEL
BUN: 18 mg/dL (ref 6–23)
CALCIUM: 10.1 mg/dL (ref 8.4–10.5)
CO2: 28 meq/L (ref 19–32)
Chloride: 104 mEq/L (ref 96–112)
Creatinine, Ser: 0.89 mg/dL (ref 0.40–1.50)
GFR: 95.39 mL/min (ref >60.00)
Glucose, Bld: 92 mg/dL (ref 70–99)
Potassium: 4.2 mEq/L (ref 3.5–5.1)
Sodium: 141 mEq/L (ref 135–145)

## 2014-11-20 LAB — LIPID PANEL
Cholesterol: 244 mg/dL — ABNORMAL HIGH (ref 0–200)
HDL: 41.7 mg/dL (ref >39.00)
Total CHOL/HDL Ratio: 6

## 2014-11-20 LAB — URINALYSIS
Bilirubin Urine: NEGATIVE
Hgb urine dipstick: NEGATIVE
Ketones, ur: NEGATIVE
LEUKOCYTES UA: NEGATIVE
Nitrite: NEGATIVE
SPECIFIC GRAVITY, URINE: 1.025 (ref 1.000–1.030)
Total Protein, Urine: NEGATIVE
URINE GLUCOSE: NEGATIVE
UROBILINOGEN UA: 0.2 (ref 0.0–1.0)
pH: 5.5 (ref 5.0–8.0)

## 2014-11-20 LAB — TSH: TSH: 1.62 u[IU]/mL (ref 0.35–4.50)

## 2014-11-20 LAB — LDL CHOLESTEROL, DIRECT: Direct LDL: 129 mg/dL

## 2014-11-20 LAB — PSA: PSA: 0.75 ng/mL (ref 0.10–4.00)

## 2014-11-20 MED ORDER — CLONAZEPAM 1 MG PO TABS: 1.0000 mg | ORAL_TABLET | Freq: Two times a day (BID) | ORAL | Status: DC | PRN

## 2014-11-20 MED ORDER — LISINOPRIL 10 MG PO TABS: 10.0000 mg | ORAL_TABLET | Freq: Two times a day (BID) | ORAL | Status: DC

## 2014-11-20 MED ORDER — ATORVASTATIN CALCIUM 10 MG PO TABS: 10.0000 mg | ORAL_TABLET | Freq: Every day | ORAL | Status: DC

## 2014-11-20 MED ORDER — SILDENAFIL CITRATE 100 MG PO TABS: ORAL_TABLET | ORAL | Status: DC

## 2014-11-20 NOTE — Progress Notes (Signed)
Pre visit review using our clinic review tool, if applicable. No additional management support is needed unless otherwise documented below in the visit note. 

## 2014-11-20 NOTE — Assessment & Plan Note (Signed)
6/15 We discussed age appropriate health related issues, including available/recomended screening tests and vaccinations. We discussed a need for adhering to healthy diet and exercise. Labs/EKG were reviewed/ordered. All questions were answered.

## 2014-11-20 NOTE — Progress Notes (Signed)
   Subjective:  Patient ID: Christian [name], male    DOB: [birth year]  Age: 54 y.o. MRN: [id number]  CC: Annual Exam   HPI Christian [name] presents for well exam. Pt had a Cologuard 2015  Outpatient Prescriptions Prior to Visit  Medication Sig Dispense Refill  . albuterol (PROVENTIL HFA;VENTOLIN HFA) 108 (90 BASE) MCG/ACT inhaler Inhale 2 puffs into the lungs every 6 (six) hours as needed for wheezing. For shortness of breath. 1 Inhaler 3  . Albuterol Sulfate (PROAIR RESPICLICK) 128 (90 BASE) MCG/ACT AEPB Inhale 1-2 puffs into the lungs 4 (four) times daily as needed. 1 each 11  . aspirin (BAYER ASPIRIN) 325 MG tablet Take 1 tablet (325 mg total) by mouth daily. 100 tablet 3  . atorvastatin (LIPITOR) 10 MG tablet Take 1 tablet (10 mg total) by mouth daily. 90 tablet 3  . clonazePAM (KLONOPIN) 1 MG tablet Take 1 tablet (1 mg total) by mouth 2 (two) times daily as needed for anxiety. 60 tablet 2  . lisinopril (PRINIVIL,ZESTRIL) 10 MG tablet Take 1 tablet (10 mg total) by mouth every morning. 90 tablet 3  . Multiple Vitamin (MULTIVITAMIN WITH MINERALS) TABS Take 1 tablet by mouth daily.    . Omega-3 Fatty Acids (SALMON) 200 MG CAPS Take 200 mg by mouth daily.    . sildenafil (VIAGRA) 100 MG tablet TAKE 1 TABLET BY MOUTH AS NEEDED 12 tablet 8  . vitamin C (ASCORBIC ACID) 500 MG tablet Take 500 mg by mouth daily.    [name] azithromycin (ZITHROMAX) 250 MG tablet As directed 6 tablet 0   No facility-administered medications prior to visit.    ROS Review of Systems  Objective:  BP 150/108 mmHg  Pulse 64  Ht 5\' 9"  (1.753 m)  Wt 237 lb (107.502 kg)  BMI 34.98 kg/m2  SpO2 97%  BP Readings from Last 3 Encounters:  11/20/14 150/108  05/13/14 120/82  11/11/13 192/100    Wt Readings from Last 3 Encounters:  11/20/14 237 lb (107.502 kg)  05/13/14 234 lb (106.142 kg)  11/11/13 213 lb (96.616 kg)    Physical Exam Pt declined rectal Lab Results  Component Value Date   WBC 7.3 11/11/2013   HGB 14.5 11/11/2013   HCT 42.8 11/11/2013   PLT 160.0 11/11/2013   GLUCOSE 107* 05/13/2014   CHOL 288* 05/13/2014   TRIG * 05/13/2014    446.0 Triglyceride is over 400; calculations on Lipids are invalid.   HDL 38.90* 05/13/2014   LDLDIRECT 162.9 05/13/2014   LDLCALC 178* 11/11/2013   ALT 36 05/13/2014   AST 33 05/13/2014   NA 138 05/13/2014   K 4.6 05/13/2014   CL 103 05/13/2014   CREATININE 0.8 05/13/2014   BUN 19 05/13/2014   CO2 27 05/13/2014   TSH 2.51 05/13/2014   PSA 1.07 11/11/2013   INR 1.01 03/28/2012   HGBA1C 5.5 05/13/2014    No results found.  Assessment & Plan:   There are no diagnoses linked to this encounter. I have discontinued Christian [name] azithromycin. I am also having him maintain his multivitamin with minerals, vitamin C, SALMON, aspirin, Albuterol Sulfate, atorvastatin, lisinopril, sildenafil, albuterol, and clonazePAM.  No orders of the defined types were placed in this encounter.     Follow-up: No Follow-up on file.  Walker Kehr, MD

## 2015-02-20 ENCOUNTER — Encounter: Payer: Self-pay | Admitting: Podiatry

## 2015-02-20 ENCOUNTER — Ambulatory Visit (INDEPENDENT_AMBULATORY_CARE_PROVIDER_SITE_OTHER): Payer: BLUE CROSS/BLUE SHIELD | Admitting: Podiatry

## 2015-02-20 VITALS — BP 150/94 | HR 70 | Resp 16 | Ht 68.0 in | Wt 235.0 lb

## 2015-02-20 DIAGNOSIS — L03012 Cellulitis of left finger: Secondary | ICD-10-CM

## 2015-02-20 DIAGNOSIS — L600 Ingrowing nail: Secondary | ICD-10-CM | POA: Diagnosis not present

## 2015-02-20 DIAGNOSIS — L03032 Cellulitis of left toe: Secondary | ICD-10-CM

## 2015-02-20 MED ORDER — CEPHALEXIN 500 MG PO CAPS: 500.0000 mg | ORAL_CAPSULE | Freq: Two times a day (BID) | ORAL | Status: DC

## 2015-02-20 NOTE — Progress Notes (Signed)
   Subjective:    Patient ID: Christian [name], male    DOB: 1961/04/10, 54 y.o.   MRN: [id number]  HPI Patient presents with an ingrown toenail in their left foot, great-medial side; x3 weeks. Pt has soaked in epsom salt with no relief.   Review of Systems  All other systems reviewed and are negative.      Objective:   Physical Exam        Assessment & Plan:

## 2015-02-20 NOTE — Patient Instructions (Signed)

## 2015-02-21 NOTE — Progress Notes (Signed)
Subjective:     Patient ID: Christian [name], male   DOB: April 17, 1961, 54 y.o.   MRN: [id number]  HPI patient states I've had a lot of redness in my left hallux that has been sore and I should've been here earlier and I've tried to soak it and trim it   Review of Systems  All other systems reviewed and are negative.      Objective:   Physical Exam  Constitutional: He is oriented to person, place, and time.  Cardiovascular: Intact distal pulses.   Musculoskeletal: Normal range of motion.  Neurological: He is oriented to person, place, and time.  Skin: Skin is warm.  Nursing note and vitals reviewed.  neurovascular status found to be intact muscle strength adequate range of motion within normal limits with patient found to have red and drainage on the left hallux medial side that's localized in nature with no proximal edema erythema or drainage noted. Good digital perfusion is noted patient's well oriented 3     Assessment:     Localized paronychia infection left hallux medial side    Plan:     H&P and condition reviewed with patient. At this point I have recommended a two-part process with removal of the offending did infection to be followed by permanent procedure. Patient wants this and today I infiltrated 60 mg I can Marcaine mixture remove the lateral border removed proud flesh abscess tissue and allowed channel for drainage. Placed on antibiotics cephalexin 5 number milligrams twice a day and patient will reappoint in 2-3 weeks for permanent procedure or earlier if symptoms do not resolve or earlier if any increased redness were to occur

## 2015-02-23 ENCOUNTER — Telehealth: Payer: Self-pay | Admitting: *Deleted

## 2015-02-23 NOTE — Telephone Encounter (Signed)
Called patient at [phone] (Cell #) to check to see how they were doing from their ingrown toenail procedure that was performed on Friday, February 20, 2015. Pt stated, "they were doing fine".

## 2015-03-02 ENCOUNTER — Ambulatory Visit (INDEPENDENT_AMBULATORY_CARE_PROVIDER_SITE_OTHER): Payer: BLUE CROSS/BLUE SHIELD | Admitting: Family

## 2015-03-02 ENCOUNTER — Encounter: Payer: Self-pay | Admitting: Family

## 2015-03-02 VITALS — BP 160/102 | HR 64 | Temp 98.5°F | Resp 18 | Ht 68.0 in | Wt 242.4 lb

## 2015-03-02 DIAGNOSIS — J069 Acute upper respiratory infection, unspecified: Secondary | ICD-10-CM | POA: Diagnosis not present

## 2015-03-02 MED ORDER — PREDNISONE 20 MG PO TABS: 20.0000 mg | ORAL_TABLET | Freq: Two times a day (BID) | ORAL | Status: DC

## 2015-03-02 MED ORDER — AZITHROMYCIN 250 MG PO TABS: ORAL_TABLET | ORAL | Status: DC

## 2015-03-02 MED ORDER — ALBUTEROL SULFATE HFA 108 (90 BASE) MCG/ACT IN AERS: 2.0000 | INHALATION_SPRAY | Freq: Four times a day (QID) | RESPIRATORY_TRACT | Status: DC | PRN

## 2015-03-02 NOTE — Patient Instructions (Signed)
Thank you for choosing [facility].  Summary/Instructions:  Your prescription(s) have been submitted to your pharmacy or been printed and provided for you. Please take as directed and contact our office if you believe you are having problem(s) with the medication(s) or have any questions.  If your symptoms worsen or fail to improve, please contact our office for further instruction, or in case of emergency go directly to the emergency room at the closest medical facility.   Please wait until at least tomorrow before starting the azithromycin.   General Recommendations:    Please drink plenty of fluids.  Get plenty of rest   Sleep in humidified air  Use saline nasal sprays  Netti pot   OTC Medications:  Decongestants - helps relieve congestion   Flonase (generic fluticasone) or Nasacort (generic triamcinolone) - please make sure to use the "cross-over" technique at a 45 degree angle towards the opposite eye as opposed to straight up the nasal passageway.   If you have HIGH BLOOD PRESSURE - Coricidin HBP; AVOID any product that is -D as this contains pseudoephedrine which may increase your blood pressure.  Afrin (oxymetazoline) every 6-8 hours for up to 3 days.   Allergies - helps relieve runny nose, itchy eyes and sneezing   Claritin (generic loratidine), Allegra (fexofenidine), or Zyrtec (generic cyrterizine) for runny nose. These medications should not cause drowsiness.  Note - Benadryl (generic diphenhydramine) may be used however may cause drowsiness  Cough -   Delsym or Robitussin (generic dextromethorphan)  Expectorants - helps loosen mucus to ease removal   Mucinex (generic guaifenesin) as directed on the package.  Headaches / General Aches   Tylenol (generic acetaminophen) - DO NOT EXCEED 3 grams (3,000 mg) in a 24 hour time period  Advil/Motrin (generic ibuprofen)   Sore Throat -   Salt water gargle   Chloraseptic (generic benzocaine)  spray or lozenges / Sucrets (generic dyclonine)

## 2015-03-02 NOTE — Assessment & Plan Note (Signed)
Symptoms and exam consistent with acute upper respiratory infection possibly viral. Start prednisone as needed. Refill albuterol. Prescription for azithromycin printed and provided to patient with instructions for watchful waiting to start if symptoms worsen. Continue over-the-counter medications as needed for Center relief and supportive care. Notes he did not take his blood pressure medication yesterday or today resulting in increased blood pressure today. Follow up if symptoms worsen or fail to improve.

## 2015-03-02 NOTE — Progress Notes (Signed)
Pre visit review using our clinic review tool, if applicable. No additional management support is needed unless otherwise documented below in the visit note. 

## 2015-03-02 NOTE — Progress Notes (Signed)
Subjective:    Patient ID: Christian [name], male    DOB: Aug 02, 1960, 54 y.o.   MRN: [id number]  Chief Complaint  Patient presents with  . Cough    x3 days congestion, a little bit of a productive cough, body aches, SOB, wheezing    HPI:  Christian [name] is a 54 y.o. male who  has a past medical history of Mild asthma; Allergic rhinitis; Lesion of bladder; H/O concussion (MILD--- NO RESIDUAL); Mild acid reflux (NO MEDS); Multiple fractures of ribs of right side (PT FELL 11-30-2011-- PER CT 9TH - 112TH RIB FX'S); Cancer (Portal); and Anxiety. and presents today for an acute office visit.   This is a new problem. Associated symptoms of congestion, slight productive cough, body aches, shortness of breath and wheezing have been going on for about 3 days. Reports that he had a subjective fever yesterday, but did not take his temperature.  Modifying factors include Nyquil, dayquil, theraflu which have helped to improve his symptoms, but not alter the course. Course of the symptoms is worse. Does not currently have an albuterol inhaler. Had an ingrown toenail that he was placed in an antibiotic about 1 week ago.    Allergies  Allergen Reactions  . Lexapro [Escitalopram Oxalate]     tired  . Lipitor [Atorvastatin]     myalgias      Current Outpatient Prescriptions on File Prior to Visit  Medication Sig Dispense Refill  . aspirin (BAYER ASPIRIN) 325 MG tablet Take 1 tablet (325 mg total) by mouth daily. 100 tablet 3  . atorvastatin (LIPITOR) 10 MG tablet Take 1 tablet (10 mg total) by mouth daily. 90 tablet 3  . clonazePAM (KLONOPIN) 1 MG tablet Take 1 tablet (1 mg total) by mouth 2 (two) times daily as needed for anxiety. 60 tablet 2  . lisinopril (PRINIVIL,ZESTRIL) 10 MG tablet Take 1 tablet (10 mg total) by mouth 2 (two) times daily. 180 tablet 3  . Multiple Vitamin (MULTIVITAMIN WITH MINERALS) TABS Take 1 tablet by mouth daily.    . Omega-3 Fatty Acids (SALMON) 200 MG CAPS Take 200 mg by  mouth daily.    . sildenafil (VIAGRA) 100 MG tablet TAKE 1 TABLET BY MOUTH AS NEEDED 12 tablet 8  . vitamin C (ASCORBIC ACID) 500 MG tablet Take 500 mg by mouth daily.     No current facility-administered medications on file prior to visit.     Review of Systems  Constitutional: Positive for fever.  HENT: Positive for congestion, ear pain, sinus pressure and sore throat.   Respiratory: Positive for cough, shortness of breath and wheezing.   Gastrointestinal: Negative for nausea, vomiting and diarrhea.  Neurological: Positive for headaches.      Objective:    BP 160/102 mmHg  Pulse 64  Temp(Src) 98.5 F (36.9 C) (Oral)  Resp 18  Ht 5\' 8"  (1.727 m)  Wt 242 lb 6.4 oz (109.952 kg)  BMI 36.87 kg/m2  SpO2 97% Nursing note and vital signs reviewed.  Physical Exam  Constitutional: He is oriented to person, place, and time. He appears well-developed and well-nourished. No distress.  HENT:  Right Ear: Hearing, tympanic membrane, external ear and ear canal normal.  Left Ear: Hearing, tympanic membrane, external ear and ear canal normal.  Nose: Nose normal. Right sinus exhibits no maxillary sinus tenderness and no frontal sinus tenderness. Left sinus exhibits no maxillary sinus tenderness and no frontal sinus tenderness.  Mouth/Throat: Uvula is midline, oropharynx is clear and  moist and mucous membranes are normal.  Eyes: Conjunctivae are normal. Pupils are equal, round, and reactive to light.  Cardiovascular: Normal rate, regular rhythm, normal heart sounds and intact distal pulses.   Pulmonary/Chest: Effort normal. He has wheezes (Mild).  Neurological: He is alert and oriented to person, place, and time.  Skin: Skin is warm and dry.  Psychiatric: He has a normal mood and affect. His behavior is normal. Judgment and thought content normal.       Assessment & Plan:   Problem List Items Addressed This Visit      Respiratory   Acute upper respiratory infection - Primary     Symptoms and exam consistent with acute upper respiratory infection possibly viral. Start prednisone as needed. Refill albuterol. Prescription for azithromycin printed and provided to patient with instructions for watchful waiting to start if symptoms worsen. Continue over-the-counter medications as needed for Center relief and supportive care. Notes he did not take his blood pressure medication yesterday or today resulting in increased blood pressure today. Follow up if symptoms worsen or fail to improve.      Relevant Medications   azithromycin (ZITHROMAX) 250 MG tablet   predniSONE (DELTASONE) 20 MG tablet

## 2015-03-13 ENCOUNTER — Ambulatory Visit: Payer: BLUE CROSS/BLUE SHIELD | Admitting: Podiatry

## 2015-04-27 ENCOUNTER — Telehealth: Payer: Self-pay | Admitting: *Deleted

## 2015-04-27 MED ORDER — LISINOPRIL 10 MG PO TABS: 10.0000 mg | ORAL_TABLET | Freq: Two times a day (BID) | ORAL | Status: DC

## 2015-04-27 NOTE — Telephone Encounter (Signed)
Wife left msg on triage stating pt is needing new rx for his Lisinopril. Called wife back no answer LMOM rx sent to [facility].../lmb

## 2015-05-25 ENCOUNTER — Ambulatory Visit: Payer: BLUE CROSS/BLUE SHIELD | Admitting: Internal Medicine

## 2015-06-03 ENCOUNTER — Other Ambulatory Visit: Payer: Self-pay | Admitting: Internal Medicine

## 2015-06-03 ENCOUNTER — Encounter: Payer: Self-pay | Admitting: Internal Medicine

## 2015-06-03 ENCOUNTER — Other Ambulatory Visit (INDEPENDENT_AMBULATORY_CARE_PROVIDER_SITE_OTHER): Payer: 59

## 2015-06-03 ENCOUNTER — Ambulatory Visit (INDEPENDENT_AMBULATORY_CARE_PROVIDER_SITE_OTHER): Payer: 59 | Admitting: Internal Medicine

## 2015-06-03 VITALS — BP 150/90 | HR 70 | Wt 256.0 lb

## 2015-06-03 DIAGNOSIS — R635 Abnormal weight gain: Secondary | ICD-10-CM

## 2015-06-03 DIAGNOSIS — C679 Malignant neoplasm of bladder, unspecified: Secondary | ICD-10-CM

## 2015-06-03 DIAGNOSIS — E669 Obesity, unspecified: Secondary | ICD-10-CM

## 2015-06-03 DIAGNOSIS — F1010 Alcohol abuse, uncomplicated: Secondary | ICD-10-CM

## 2015-06-03 DIAGNOSIS — I10 Essential (primary) hypertension: Secondary | ICD-10-CM

## 2015-06-03 DIAGNOSIS — M2550 Pain in unspecified joint: Secondary | ICD-10-CM

## 2015-06-03 DIAGNOSIS — E785 Hyperlipidemia, unspecified: Secondary | ICD-10-CM

## 2015-06-03 LAB — CBC WITH DIFFERENTIAL/PLATELET
BASOS ABS: 0 10*3/uL (ref 0.0–0.1)
BASOS PCT: 0.7 % (ref 0.0–3.0)
EOS ABS: 0.6 10*3/uL (ref 0.0–0.7)
Eosinophils Relative: 9.2 % — ABNORMAL HIGH (ref 0.0–5.0)
HEMATOCRIT: 39.3 % (ref 39.0–52.0)
HEMOGLOBIN: 13.4 g/dL (ref 13.0–17.0)
LYMPHS PCT: 25.6 % (ref 12.0–46.0)
Lymphs Abs: 1.8 10*3/uL (ref 0.7–4.0)
MCHC: 34 g/dL (ref 30.0–36.0)
MCV: 94.7 fl (ref 78.0–100.0)
MONO ABS: 0.5 10*3/uL (ref 0.1–1.0)
Monocytes Relative: 7.5 % (ref 3.0–12.0)
Neutro Abs: 4 10*3/uL (ref 1.4–7.7)
Neutrophils Relative %: 57 % (ref 43.0–77.0)
PLATELETS: 170 10*3/uL (ref 150.0–400.0)
RBC: 4.15 Mil/uL — ABNORMAL LOW (ref 4.22–5.81)
RDW: 13.3 % (ref 11.5–15.5)
WBC: 7 10*3/uL (ref 4.0–10.5)

## 2015-06-03 LAB — URINALYSIS, ROUTINE W REFLEX MICROSCOPIC
BILIRUBIN URINE: NEGATIVE
HGB URINE DIPSTICK: NEGATIVE
Ketones, ur: NEGATIVE
NITRITE: NEGATIVE
RBC / HPF: NONE SEEN (ref 0–?)
Specific Gravity, Urine: 1.02 (ref 1.000–1.030)
Total Protein, Urine: NEGATIVE
Urine Glucose: NEGATIVE
Urobilinogen, UA: 0.2 (ref 0.0–1.0)
pH: 5.5 (ref 5.0–8.0)

## 2015-06-03 LAB — LIPID PANEL
CHOL/HDL RATIO: 6
Cholesterol: 234 mg/dL — ABNORMAL HIGH (ref 0–200)
HDL: 39.3 mg/dL (ref >39.00)
LDL Cholesterol: 156 mg/dL — ABNORMAL HIGH (ref 0–99)
NONHDL: 194.46
TRIGLYCERIDES: 190 mg/dL — AB (ref 0.0–149.0)
VLDL: 38 mg/dL (ref 0.0–40.0)

## 2015-06-03 LAB — HEPATIC FUNCTION PANEL
ALK PHOS: 74 U/L (ref 39–117)
ALT: 41 U/L (ref 0–53)
AST: 35 U/L (ref 0–37)
Albumin: 4.6 g/dL (ref 3.5–5.2)
BILIRUBIN DIRECT: 0.1 mg/dL (ref 0.0–0.3)
Total Bilirubin: 0.6 mg/dL (ref 0.2–1.2)
Total Protein: 7.2 g/dL (ref 6.0–8.3)

## 2015-06-03 LAB — BASIC METABOLIC PANEL
BUN: 17 mg/dL (ref 6–23)
CHLORIDE: 105 meq/L (ref 96–112)
CO2: 29 mEq/L (ref 19–32)
Calcium: 9.8 mg/dL (ref 8.4–10.5)
Creatinine, Ser: 0.87 mg/dL (ref 0.40–1.50)
GFR: 97.72 mL/min (ref >60.00)
GLUCOSE: 94 mg/dL (ref 70–99)
POTASSIUM: 4.6 meq/L (ref 3.5–5.1)
SODIUM: 141 meq/L (ref 135–145)

## 2015-06-03 LAB — VITAMIN D 25 HYDROXY (VIT D DEFICIENCY, FRACTURES): VITD: 25.02 ng/mL — AB (ref 30.00–100.00)

## 2015-06-03 LAB — CK: Total CK: 95 U/L (ref 7–232)

## 2015-06-03 LAB — TSH: TSH: 1.65 u[IU]/mL (ref 0.35–4.50)

## 2015-06-03 MED ORDER — ERGOCALCIFEROL 1.25 MG (50000 UT) PO CAPS: 50000.0000 [IU] | ORAL_CAPSULE | ORAL | Status: DC

## 2015-06-03 MED ORDER — LORCASERIN HCL 10 MG PO TABS: ORAL_TABLET | ORAL | Status: DC

## 2015-06-03 MED ORDER — CLONAZEPAM 1 MG PO TABS: 1.0000 mg | ORAL_TABLET | Freq: Two times a day (BID) | ORAL | Status: DC | PRN

## 2015-06-03 MED ORDER — TRAMADOL HCL 50 MG PO TABS: 50.0000 mg | ORAL_TABLET | Freq: Two times a day (BID) | ORAL | Status: DC | PRN

## 2015-06-03 MED ORDER — LISINOPRIL 20 MG PO TABS: 20.0000 mg | ORAL_TABLET | Freq: Two times a day (BID) | ORAL | Status: DC

## 2015-06-03 NOTE — Assessment & Plan Note (Signed)
Doing well 

## 2015-06-03 NOTE — Progress Notes (Signed)
Subjective:  Patient ID: Christian [name], male    DOB: [birth year]  Age: 55 y.o. MRN: [id number]  CC: No chief complaint on file.   HPI Christian [name] presents for c/o wt gain, achy feeling, fatigue. F/u anxiety, HTN. BP was high the other day  Outpatient Prescriptions Prior to Visit  Medication Sig Dispense Refill  . albuterol (PROVENTIL HFA;VENTOLIN HFA) 108 (90 BASE) MCG/ACT inhaler Inhale 2 puffs into the lungs every 6 (six) hours as needed for wheezing. For shortness of breath. 1 Inhaler 3  . aspirin (BAYER ASPIRIN) 325 MG tablet Take 1 tablet (325 mg total) by mouth daily. 100 tablet 3  . atorvastatin (LIPITOR) 10 MG tablet Take 1 tablet (10 mg total) by mouth daily. 90 tablet 3  . Multiple Vitamin (MULTIVITAMIN WITH MINERALS) TABS Take 1 tablet by mouth daily.    . Omega-3 Fatty Acids (SALMON) 200 MG CAPS Take 200 mg by mouth daily.    . sildenafil (VIAGRA) 100 MG tablet TAKE 1 TABLET BY MOUTH AS NEEDED 12 tablet 8  . vitamin C (ASCORBIC ACID) 500 MG tablet Take 500 mg by mouth daily.    . clonazePAM (KLONOPIN) 1 MG tablet Take 1 tablet (1 mg total) by mouth 2 (two) times daily as needed for anxiety. 60 tablet 2  . lisinopril (PRINIVIL,ZESTRIL) 10 MG tablet Take 1 tablet (10 mg total) by mouth 2 (two) times daily. 180 tablet 2  . azithromycin (ZITHROMAX) 250 MG tablet Take 2 tablets by mouth for 1 day and then 1 tablet by mouth daily for 4 days. (Patient not taking: Reported on 06/03/2015) 6 tablet 0  . predniSONE (DELTASONE) 20 MG tablet Take 1 tablet (20 mg total) by mouth 2 (two) times daily with a meal. (Patient not taking: Reported on 06/03/2015) 10 tablet 0   No facility-administered medications prior to visit.    ROS Review of Systems  Constitutional: Positive for fatigue and unexpected weight change. Negative for appetite change.  HENT: Negative for congestion, nosebleeds, sneezing, sore throat and trouble swallowing.   Eyes: Negative for itching and visual disturbance.    Respiratory: Negative for cough.   Cardiovascular: Negative for chest pain, palpitations and leg swelling.  Gastrointestinal: Negative for nausea, diarrhea, blood in stool and abdominal distention.  Genitourinary: Negative for frequency and hematuria.  Musculoskeletal: Positive for arthralgias. Negative for back pain, joint swelling, gait problem and neck pain.  Skin: Negative for rash.  Neurological: Negative for dizziness, tremors, speech difficulty and weakness.  Psychiatric/Behavioral: Negative for sleep disturbance, dysphoric mood and agitation. The patient is nervous/anxious.     Objective:  BP 150/90 mmHg  Pulse 70  Wt 256 lb (116.121 kg)  SpO2 97%  BP Readings from Last 3 Encounters:  06/03/15 150/90  03/02/15 160/102  02/20/15 150/94    Wt Readings from Last 3 Encounters:  06/03/15 256 lb (116.121 kg)  03/02/15 242 lb 6.4 oz (109.952 kg)  02/20/15 235 lb (106.595 kg)    Physical Exam  Constitutional: He is oriented to person, place, and time. He appears well-developed. No distress.  NAD  HENT:  Mouth/Throat: Oropharynx is clear and moist.  Eyes: Conjunctivae are normal. Pupils are equal, round, and reactive to light.  Neck: Normal range of motion. No JVD present. No thyromegaly present.  Cardiovascular: Normal rate, regular rhythm, normal heart sounds and intact distal pulses.  Exam reveals no gallop and no friction rub.   No murmur heard. Pulmonary/Chest: Effort normal and breath sounds normal. No  respiratory distress. He has no wheezes. He has no rales. He exhibits no tenderness.  Abdominal: Soft. Bowel sounds are normal. He exhibits no distension and no mass. There is no tenderness. There is no rebound and no guarding.  Musculoskeletal: Normal range of motion. He exhibits no edema or tenderness.  Lymphadenopathy:    He has no cervical adenopathy.  Neurological: He is alert and oriented to person, place, and time. He has normal reflexes. No cranial nerve  deficit. He exhibits normal muscle tone. He displays a negative Romberg sign. Coordination and gait normal.  Skin: Skin is warm and dry. No rash noted.  Psychiatric: He has a normal mood and affect. His behavior is normal. Judgment and thought content normal.    Lab Results  Component Value Date   WBC 8.3 11/20/2014   HGB 13.9 11/20/2014   HCT 40.2 11/20/2014   PLT 168.0 11/20/2014   GLUCOSE 92 11/20/2014   CHOL 244* 11/20/2014   TRIG * 11/20/2014    408.0 Triglyceride is over 400; calculations on Lipids are invalid.   HDL 41.70 11/20/2014   LDLDIRECT 129.0 11/20/2014   LDLCALC 178* 11/11/2013   ALT 32 11/20/2014   AST 25 11/20/2014   NA 141 11/20/2014   K 4.2 11/20/2014   CL 104 11/20/2014   CREATININE 0.89 11/20/2014   BUN 18 11/20/2014   CO2 28 11/20/2014   TSH 1.62 11/20/2014   PSA 0.75 11/20/2014   INR 1.01 03/28/2012   HGBA1C 5.5 05/13/2014    No results found.  Assessment & Plan:   Diagnoses and all orders for this visit:  Essential hypertension  Weight gain  Dyslipidemia  Obesity  Alcohol abuse  Other orders -     clonazePAM (KLONOPIN) 1 MG tablet; Take 1 tablet (1 mg total) by mouth 2 (two) times daily as needed for anxiety. -     lisinopril (PRINIVIL,ZESTRIL) 20 MG tablet; Take 1 tablet (20 mg total) by mouth 2 (two) times daily.   I have discontinued Christian [name] azithromycin, predniSONE, and lisinopril. I am also having him start on lisinopril. Additionally, I am having him maintain his multivitamin with minerals, vitamin C, SALMON, aspirin, atorvastatin, sildenafil, albuterol, and clonazePAM.  Meds ordered this encounter  Medications  . clonazePAM (KLONOPIN) 1 MG tablet    Sig: Take 1 tablet (1 mg total) by mouth 2 (two) times daily as needed for anxiety.    Dispense:  60 tablet    Refill:  5  . lisinopril (PRINIVIL,ZESTRIL) 20 MG tablet    Sig: Take 1 tablet (20 mg total) by mouth 2 (two) times daily.    Dispense:  180 tablet    Refill:   3     Follow-up: No Follow-up on file.  Walker Kehr, MD

## 2015-06-03 NOTE — Assessment & Plan Note (Signed)
Try Belviq  Potential benefits of a long term Belviq  use as well as potential risks  and complications were explained to the patient and were aknowledged.

## 2015-06-03 NOTE — Assessment & Plan Note (Signed)
Worse Wt Readings from Last 3 Encounters:  06/03/15 256 lb (116.121 kg)  03/02/15 242 lb 6.4 oz (109.952 kg)  02/20/15 235 lb (106.595 kg)

## 2015-06-03 NOTE — Assessment & Plan Note (Signed)
On Lipitor 

## 2015-06-03 NOTE — Assessment & Plan Note (Signed)
Refused f/u Urine cytology, UA

## 2015-06-03 NOTE — Assessment & Plan Note (Signed)
1/17 worse due to wt gain On Lisinopril

## 2015-06-03 NOTE — Progress Notes (Signed)
Pre visit review using our clinic review tool, if applicable. No additional management support is needed unless otherwise documented below in the visit note. 

## 2015-08-04 ENCOUNTER — Ambulatory Visit (INDEPENDENT_AMBULATORY_CARE_PROVIDER_SITE_OTHER): Payer: 59 | Admitting: Internal Medicine

## 2015-08-04 ENCOUNTER — Other Ambulatory Visit (INDEPENDENT_AMBULATORY_CARE_PROVIDER_SITE_OTHER): Payer: 59

## 2015-08-04 ENCOUNTER — Encounter: Payer: Self-pay | Admitting: Internal Medicine

## 2015-08-04 VITALS — BP 128/70 | HR 79 | Temp 99.2°F | Wt 248.0 lb

## 2015-08-04 DIAGNOSIS — R310 Gross hematuria: Secondary | ICD-10-CM | POA: Insufficient documentation

## 2015-08-04 DIAGNOSIS — C679 Malignant neoplasm of bladder, unspecified: Secondary | ICD-10-CM

## 2015-08-04 LAB — URINALYSIS
BILIRUBIN URINE: NEGATIVE
Hgb urine dipstick: NEGATIVE
KETONES UR: NEGATIVE
LEUKOCYTES UA: NEGATIVE
Nitrite: NEGATIVE
SPECIFIC GRAVITY, URINE: 1.025 (ref 1.000–1.030)
Total Protein, Urine: NEGATIVE
Urine Glucose: NEGATIVE
Urobilinogen, UA: 0.2 (ref 0.0–1.0)
pH: 5 (ref 5.0–8.0)

## 2015-08-04 MED ORDER — CIPROFLOXACIN HCL 500 MG PO TABS: 500.0000 mg | ORAL_TABLET | Freq: Two times a day (BID) | ORAL | Status: DC

## 2015-08-04 NOTE — Progress Notes (Signed)
Pre visit review using our clinic review tool, if applicable. No additional management support is needed unless otherwise documented below in the visit note. 

## 2015-08-04 NOTE — Assessment & Plan Note (Addendum)
H/o bladder cancer dr Jasmine December removed it -- 2013

## 2015-08-04 NOTE — Progress Notes (Signed)
Subjective:  Patient ID: Christian [name], male    DOB: August 05, 1960  Age: 55 y.o. MRN: [id number]  CC: Hematuria and Abdominal Pain   HPI Christian [name] presents for R flank pain off and on and blood in the urine off and on w/clots. H/o bladder cancer dr Jasmine December removed it -- 2013  Outpatient Prescriptions Prior to Visit  Medication Sig Dispense Refill  . albuterol (PROVENTIL HFA;VENTOLIN HFA) 108 (90 BASE) MCG/ACT inhaler Inhale 2 puffs into the lungs every 6 (six) hours as needed for wheezing. For shortness of breath. 1 Inhaler 3  . aspirin (BAYER ASPIRIN) 325 MG tablet Take 1 tablet (325 mg total) by mouth daily. 100 tablet 3  . atorvastatin (LIPITOR) 10 MG tablet Take 1 tablet (10 mg total) by mouth daily. 90 tablet 3  . clonazePAM (KLONOPIN) 1 MG tablet Take 1 tablet (1 mg total) by mouth 2 (two) times daily as needed for anxiety. 60 tablet 5  . ergocalciferol (VITAMIN D2) 50000 units capsule Take 1 capsule (50,000 Units total) by mouth once a week. 6 capsule 0  . lisinopril (PRINIVIL,ZESTRIL) 20 MG tablet Take 1 tablet (20 mg total) by mouth 2 (two) times daily. 180 tablet 3  . Lorcaserin HCl (BELVIQ) 10 MG TABS Use bid 60 tablet 2  . Multiple Vitamin (MULTIVITAMIN WITH MINERALS) TABS Take 1 tablet by mouth daily.    . Omega-3 Fatty Acids (SALMON) 200 MG CAPS Take 200 mg by mouth daily.    . sildenafil (VIAGRA) 100 MG tablet TAKE 1 TABLET BY MOUTH AS NEEDED 12 tablet 8  . traMADol (ULTRAM) 50 MG tablet Take 1-2 tablets (50-100 mg total) by mouth 2 (two) times daily as needed. 60 tablet 2  . vitamin C (ASCORBIC ACID) 500 MG tablet Take 500 mg by mouth daily.     No facility-administered medications prior to visit.    ROS Review of Systems  Constitutional: Negative for appetite change, fatigue and unexpected weight change.  HENT: Negative for congestion, nosebleeds, sneezing, sore throat and trouble swallowing.   Eyes: Negative for itching and visual disturbance.  Respiratory:  Negative for cough.   Cardiovascular: Negative for chest pain, palpitations and leg swelling.  Gastrointestinal: Negative for nausea, diarrhea, blood in stool and abdominal distention.  Genitourinary: Negative for frequency and hematuria.  Musculoskeletal: Negative for back pain, joint swelling, gait problem and neck pain.  Skin: Negative for rash.  Neurological: Negative for dizziness, tremors, speech difficulty and weakness.  Psychiatric/Behavioral: Negative for sleep disturbance, dysphoric mood and agitation. The patient is not nervous/anxious.     Objective:  BP 128/70 mmHg  Pulse 79  Temp(Src) 99.2 F (37.3 C) (Oral)  Wt 248 lb (112.492 kg)  SpO2 95%  BP Readings from Last 3 Encounters:  08/04/15 128/70  06/03/15 150/90  03/02/15 160/102    Wt Readings from Last 3 Encounters:  08/04/15 248 lb (112.492 kg)  06/03/15 256 lb (116.121 kg)  03/02/15 242 lb 6.4 oz (109.952 kg)    Physical Exam  Constitutional: He is oriented to person, place, and time. He appears well-developed. No distress.  NAD  HENT:  Mouth/Throat: Oropharynx is clear and moist.  Eyes: Conjunctivae are normal. Pupils are equal, round, and reactive to light.  Neck: Normal range of motion. No JVD present. No thyromegaly present.  Cardiovascular: Normal rate, regular rhythm, normal heart sounds and intact distal pulses.  Exam reveals no gallop and no friction rub.   No murmur heard. Pulmonary/Chest: Effort normal and  breath sounds normal. No respiratory distress. He has no wheezes. He has no rales. He exhibits no tenderness.  Abdominal: Soft. Bowel sounds are normal. He exhibits no distension and no mass. There is no tenderness. There is no rebound and no guarding.  Musculoskeletal: Normal range of motion. He exhibits no edema or tenderness.  Lymphadenopathy:    He has no cervical adenopathy.  Neurological: He is alert and oriented to person, place, and time. He has normal reflexes. No cranial nerve  deficit. He exhibits normal muscle tone. He displays a negative Romberg sign. Coordination and gait normal.  Skin: Skin is warm and dry. No rash noted.  Psychiatric: He has a normal mood and affect. His behavior is normal. Judgment and thought content normal.  Obese  Lab Results  Component Value Date   WBC 7.0 06/03/2015   HGB 13.4 06/03/2015   HCT 39.3 06/03/2015   PLT 170.0 06/03/2015   GLUCOSE 94 06/03/2015   CHOL 234* 06/03/2015   TRIG 190.0* 06/03/2015   HDL 39.30 06/03/2015   LDLDIRECT 129.0 11/20/2014   LDLCALC 156* 06/03/2015   ALT 41 06/03/2015   AST 35 06/03/2015   NA 141 06/03/2015   K 4.6 06/03/2015   CL 105 06/03/2015   CREATININE 0.87 06/03/2015   BUN 17 06/03/2015   CO2 29 06/03/2015   TSH 1.65 06/03/2015   PSA 0.75 11/20/2014   INR 1.01 03/28/2012   HGBA1C 5.5 05/13/2014    No results found.  Assessment & Plan:   Christian [name] was seen today for hematuria and abdominal pain.  Diagnoses and all orders for this visit:  Malignant neoplasm of urinary bladder, unspecified site [facility]) -     Ambulatory referral to Urology  Hematuria, gross -     Ambulatory referral to Urology -     Urinalysis; Future  Other orders -     ciprofloxacin (CIPRO) 500 MG tablet; Take 1 tablet (500 mg total) by mouth 2 (two) times daily.  I am having Christian [name] start on ciprofloxacin. I am also having him maintain his multivitamin with minerals, vitamin C, SALMON, aspirin, atorvastatin, sildenafil, albuterol, clonazePAM, lisinopril, Lorcaserin HCl, traMADol, and ergocalciferol.  Meds ordered this encounter  Medications  . ciprofloxacin (CIPRO) 500 MG tablet    Sig: Take 1 tablet (500 mg total) by mouth 2 (two) times daily.    Dispense:  20 tablet    Refill:  0     Follow-up: Return for a follow-up visit.  Walker Kehr, MD

## 2015-08-13 ENCOUNTER — Ambulatory Visit: Payer: BLUE CROSS/BLUE SHIELD | Admitting: Podiatry

## 2015-08-20 ENCOUNTER — Other Ambulatory Visit: Payer: Self-pay | Admitting: Urology

## 2015-08-31 ENCOUNTER — Encounter (HOSPITAL_BASED_OUTPATIENT_CLINIC_OR_DEPARTMENT_OTHER): Payer: Self-pay | Admitting: *Deleted

## 2015-08-31 NOTE — Progress Notes (Addendum)
NPO AFTER MN WITH EXCEPTION CLEAR LIQUIDS UNTIL 0730 (NO CREAM [place] PRODUCTS).  ARRIVE AT 1215.  NEEDS ISTAT AND EKG.   WILL TAKE ZANTAC AM DOS W/ SIPS OF WATER.

## 2015-09-01 ENCOUNTER — Encounter: Payer: Self-pay | Admitting: Internal Medicine

## 2015-09-01 ENCOUNTER — Ambulatory Visit (INDEPENDENT_AMBULATORY_CARE_PROVIDER_SITE_OTHER): Payer: 59 | Admitting: Internal Medicine

## 2015-09-01 VITALS — BP 158/100 | HR 79 | Wt 249.0 lb

## 2015-09-01 DIAGNOSIS — F410 Panic disorder [episodic paroxysmal anxiety] without agoraphobia: Secondary | ICD-10-CM

## 2015-09-01 DIAGNOSIS — I10 Essential (primary) hypertension: Secondary | ICD-10-CM

## 2015-09-01 DIAGNOSIS — R635 Abnormal weight gain: Secondary | ICD-10-CM | POA: Diagnosis not present

## 2015-09-01 DIAGNOSIS — C670 Malignant neoplasm of trigone of bladder: Secondary | ICD-10-CM

## 2015-09-01 DIAGNOSIS — R310 Gross hematuria: Secondary | ICD-10-CM

## 2015-09-01 MED ORDER — AMLODIPINE BESYLATE 5 MG PO TABS: 5.0000 mg | ORAL_TABLET | Freq: Every day | ORAL | Status: DC

## 2015-09-01 NOTE — Assessment & Plan Note (Signed)
Resolved  Surgery pending

## 2015-09-01 NOTE — Progress Notes (Signed)
Pre visit review using our clinic review tool, if applicable. No additional management support is needed unless otherwise documented below in the visit note. 

## 2015-09-01 NOTE — Progress Notes (Signed)
Subjective:  Patient ID: Christian [name], male    DOB: November 16, 1960  Age: 55 y.o. MRN: [id number]  CC: No chief complaint on file.   HPI Christian [name] presents for HTN, hematuria f/u (no relapse)  Outpatient Prescriptions Prior to Visit  Medication Sig Dispense Refill  . albuterol (PROVENTIL HFA;VENTOLIN HFA) 108 (90 BASE) MCG/ACT inhaler Inhale 2 puffs into the lungs every 6 (six) hours as needed for wheezing. For shortness of breath. 1 Inhaler 3  . aspirin EC 325 MG tablet Take 325 mg by mouth daily.    [name] atorvastatin (LIPITOR) 10 MG tablet Take 10 mg by mouth daily.    . clonazePAM (KLONOPIN) 1 MG tablet Take 1 tablet (1 mg total) by mouth 2 (two) times daily as needed for anxiety. 60 tablet 5  . lisinopril (PRINIVIL,ZESTRIL) 20 MG tablet Take 1 tablet (20 mg total) by mouth 2 (two) times daily. 180 tablet 3  . Lorcaserin HCl (BELVIQ) 10 MG TABS Use bid 60 tablet 2  . ranitidine (ZANTAC) 150 MG tablet Take 150 mg by mouth as needed for heartburn.    . sildenafil (VIAGRA) 100 MG tablet TAKE 1 TABLET BY MOUTH AS NEEDED 12 tablet 8  . traMADol (ULTRAM) 50 MG tablet Take 1-2 tablets (50-100 mg total) by mouth 2 (two) times daily as needed. 60 tablet 2   No facility-administered medications prior to visit.    ROS Review of Systems  Constitutional: Positive for unexpected weight change. Negative for appetite change and fatigue.  HENT: Negative for congestion, nosebleeds, sneezing, sore throat and trouble swallowing.   Eyes: Negative for itching and visual disturbance.  Respiratory: Negative for cough.   Cardiovascular: Negative for chest pain, palpitations and leg swelling.  Gastrointestinal: Negative for nausea, diarrhea, blood in stool and abdominal distention.  Genitourinary: Positive for hematuria. Negative for frequency and decreased urine volume.  Musculoskeletal: Negative for back pain, joint swelling, gait problem and neck pain.  Skin: Negative for rash.  Neurological:  Negative for dizziness, tremors, speech difficulty and weakness.  Psychiatric/Behavioral: Negative for suicidal ideas, sleep disturbance, dysphoric mood and agitation. The patient is not nervous/anxious.     Objective:  BP 158/100 mmHg  Pulse 79  Wt 249 lb (112.946 kg)  SpO2 94%  BP Readings from Last 3 Encounters:  09/01/15 158/100  08/04/15 128/70  06/03/15 150/90    Wt Readings from Last 3 Encounters:  09/01/15 249 lb (112.946 kg)  08/31/15 240 lb (108.863 kg)  08/04/15 248 lb (112.492 kg)    Physical Exam  Constitutional: He is oriented to person, place, and time. He appears well-developed. No distress.  NAD  HENT:  Mouth/Throat: Oropharynx is clear and moist.  Eyes: Conjunctivae are normal. Pupils are equal, round, and reactive to light.  Neck: Normal range of motion. No JVD present. No thyromegaly present.  Cardiovascular: Normal rate, regular rhythm, normal heart sounds and intact distal pulses.  Exam reveals no gallop and no friction rub.   No murmur heard. Pulmonary/Chest: Effort normal and breath sounds normal. No respiratory distress. He has no wheezes. He has no rales. He exhibits no tenderness.  Abdominal: Soft. Bowel sounds are normal. He exhibits no distension and no mass. There is no tenderness. There is no rebound and no guarding.  Musculoskeletal: Normal range of motion. He exhibits no edema or tenderness.  Lymphadenopathy:    He has no cervical adenopathy.  Neurological: He is alert and oriented to person, place, and time. He has normal reflexes.  No cranial nerve deficit. He exhibits normal muscle tone. He displays a negative Romberg sign. Coordination and gait normal.  Skin: Skin is warm and dry. No rash noted.  Psychiatric: He has a normal mood and affect. His behavior is normal. Judgment and thought content normal.  Obese  Lab Results  Component Value Date   WBC 7.0 06/03/2015   HGB 13.4 06/03/2015   HCT 39.3 06/03/2015   PLT 170.0 06/03/2015    GLUCOSE 94 06/03/2015   CHOL 234* 06/03/2015   TRIG 190.0* 06/03/2015   HDL 39.30 06/03/2015   LDLDIRECT 129.0 11/20/2014   LDLCALC 156* 06/03/2015   ALT 41 06/03/2015   AST 35 06/03/2015   NA 141 06/03/2015   K 4.6 06/03/2015   CL 105 06/03/2015   CREATININE 0.87 06/03/2015   BUN 17 06/03/2015   CO2 29 06/03/2015   TSH 1.65 06/03/2015   PSA 0.75 11/20/2014   INR 1.01 03/28/2012   HGBA1C 5.5 05/13/2014    No results found.  Assessment & Plan:   There are no diagnoses linked to this encounter. I am having Mr. Leeman maintain his sildenafil, albuterol, clonazePAM, lisinopril, Lorcaserin HCl, traMADol, aspirin EC, atorvastatin, and ranitidine.  No orders of the defined types were placed in this encounter.     Follow-up: No Follow-up on file.  Walker Kehr, MD

## 2015-09-01 NOTE — Assessment & Plan Note (Signed)
Added Norvasc 

## 2015-09-01 NOTE — Assessment & Plan Note (Signed)
Discussed diet  

## 2015-09-01 NOTE — Assessment & Plan Note (Signed)
Klonopin prn - rare

## 2015-09-01 NOTE — Assessment & Plan Note (Signed)
Dr Alyson Ingles  S/p CT Surgery pending

## 2015-09-04 ENCOUNTER — Ambulatory Visit (HOSPITAL_BASED_OUTPATIENT_CLINIC_OR_DEPARTMENT_OTHER): Payer: 59 | Admitting: Anesthesiology

## 2015-09-04 ENCOUNTER — Ambulatory Visit (HOSPITAL_COMMUNITY): Payer: 59

## 2015-09-04 ENCOUNTER — Ambulatory Visit (HOSPITAL_BASED_OUTPATIENT_CLINIC_OR_DEPARTMENT_OTHER)
Admission: RE | Admit: 2015-09-04 | Discharge: 2015-09-04 | Disposition: A | Discharge status: Home / Self Care | Payer: 59 | Source: Ambulatory Visit | Attending: Urology | Admitting: Urology

## 2015-09-04 ENCOUNTER — Encounter (HOSPITAL_BASED_OUTPATIENT_CLINIC_OR_DEPARTMENT_OTHER)
Admission: RE | Disposition: A | Discharge status: Home / Self Care | Payer: Self-pay | Source: Ambulatory Visit | Attending: Urology

## 2015-09-04 ENCOUNTER — Other Ambulatory Visit: Payer: Self-pay

## 2015-09-04 ENCOUNTER — Encounter (HOSPITAL_BASED_OUTPATIENT_CLINIC_OR_DEPARTMENT_OTHER): Payer: Self-pay | Admitting: *Deleted

## 2015-09-04 DIAGNOSIS — E785 Hyperlipidemia, unspecified: Secondary | ICD-10-CM | POA: Insufficient documentation

## 2015-09-04 DIAGNOSIS — F17290 Nicotine dependence, other tobacco product, uncomplicated: Secondary | ICD-10-CM | POA: Diagnosis not present

## 2015-09-04 DIAGNOSIS — I10 Essential (primary) hypertension: Secondary | ICD-10-CM | POA: Diagnosis not present

## 2015-09-04 DIAGNOSIS — C679 Malignant neoplasm of bladder, unspecified: Secondary | ICD-10-CM | POA: Insufficient documentation

## 2015-09-04 DIAGNOSIS — F419 Anxiety disorder, unspecified: Secondary | ICD-10-CM | POA: Diagnosis not present

## 2015-09-04 DIAGNOSIS — D494 Neoplasm of unspecified behavior of bladder: Secondary | ICD-10-CM

## 2015-09-04 DIAGNOSIS — Z7982 Long term (current) use of aspirin: Secondary | ICD-10-CM | POA: Insufficient documentation

## 2015-09-04 DIAGNOSIS — J45909 Unspecified asthma, uncomplicated: Secondary | ICD-10-CM | POA: Insufficient documentation

## 2015-09-04 DIAGNOSIS — K219 Gastro-esophageal reflux disease without esophagitis: Secondary | ICD-10-CM | POA: Diagnosis not present

## 2015-09-04 HISTORY — DX: Male erectile dysfunction, unspecified: N52.9

## 2015-09-04 HISTORY — DX: Essential (primary) hypertension: I10

## 2015-09-04 HISTORY — DX: Hyperlipidemia, unspecified: E78.5

## 2015-09-04 HISTORY — DX: Personal history of other mental and behavioral disorders: Z86.59

## 2015-09-04 HISTORY — PX: TRANSURETHRAL RESECTION OF BLADDER TUMOR WITH GYRUS (TURBT-GYRUS): SHX6458

## 2015-09-04 HISTORY — DX: Gastro-esophageal reflux disease without esophagitis: K21.9

## 2015-09-04 HISTORY — PX: CYSTOSCOPY W/ RETROGRADES: SHX1426

## 2015-09-04 HISTORY — DX: Personal history of other specified conditions: Z87.898

## 2015-09-04 LAB — POCT I-STAT, CHEM 8
BUN: 16 mg/dL (ref 6–20)
CREATININE: 0.9 mg/dL (ref 0.61–1.24)
Calcium, Ion: 1.25 mmol/L — ABNORMAL HIGH (ref 1.12–1.23)
Chloride: 104 mmol/L (ref 101–111)
Glucose, Bld: 88 mg/dL (ref 65–99)
HEMATOCRIT: 45 % (ref 39.0–52.0)
Hemoglobin: 15.3 g/dL (ref 13.0–17.0)
Potassium: 4.2 mmol/L (ref 3.5–5.1)
Sodium: 142 mmol/L (ref 135–145)
TCO2: 24 mmol/L (ref 0–100)

## 2015-09-04 SURGERY — TRANSURETHRAL RESECTION OF BLADDER TUMOR WITH GYRUS (TURBT-GYRUS)
Anesthesia: General

## 2015-09-04 MED ORDER — CEFAZOLIN SODIUM-DEXTROSE 2-4 GM/100ML-% IV SOLN
INTRAVENOUS | Status: AC
  Filled 2015-09-04: qty 100

## 2015-09-04 MED ORDER — FENTANYL CITRATE (PF) 100 MCG/2ML IJ SOLN
INTRAMUSCULAR | Status: AC
  Filled 2015-09-04: qty 2

## 2015-09-04 MED ORDER — METOCLOPRAMIDE HCL 5 MG/ML IJ SOLN
INTRAMUSCULAR | Status: DC | PRN
  Administered 2015-09-04: 10 mg via INTRAVENOUS

## 2015-09-04 MED ORDER — LACTATED RINGERS IV SOLN
INTRAVENOUS | Status: DC
  Administered 2015-09-04 (×2): via INTRAVENOUS
  Filled 2015-09-04: qty 1000

## 2015-09-04 MED ORDER — KETOROLAC TROMETHAMINE 30 MG/ML IJ SOLN
INTRAMUSCULAR | Status: DC | PRN
  Administered 2015-09-04: 30 mg via INTRAVENOUS

## 2015-09-04 MED ORDER — ROCURONIUM BROMIDE 100 MG/10ML IV SOLN
INTRAVENOUS | Status: DC | PRN
  Administered 2015-09-04: 10 mg via INTRAVENOUS
  Administered 2015-09-04: 40 mg via INTRAVENOUS

## 2015-09-04 MED ORDER — BELLADONNA ALKALOIDS-OPIUM 16.2-60 MG RE SUPP
RECTAL | Status: AC
  Filled 2015-09-04: qty 1

## 2015-09-04 MED ORDER — PROPOFOL 10 MG/ML IV BOLUS
INTRAVENOUS | Status: AC
  Filled 2015-09-04: qty 40

## 2015-09-04 MED ORDER — CEFAZOLIN SODIUM-DEXTROSE 2-4 GM/100ML-% IV SOLN
2.0000 g | INTRAVENOUS | Status: AC
  Administered 2015-09-04: 2 g via INTRAVENOUS
  Filled 2015-09-04: qty 100

## 2015-09-04 MED ORDER — HYOSCYAMINE SULFATE 0.125 MG SL SUBL: 0.1250 mg | SUBLINGUAL_TABLET | SUBLINGUAL | Status: DC | PRN

## 2015-09-04 MED ORDER — BELLADONNA ALKALOIDS-OPIUM 16.2-60 MG RE SUPP
1.0000 | Freq: Every day | RECTAL | Status: DC
  Administered 2015-09-04: 1 via RECTAL
  Filled 2015-09-04: qty 1

## 2015-09-04 MED ORDER — MIDAZOLAM HCL 5 MG/5ML IJ SOLN
INTRAMUSCULAR | Status: DC | PRN
  Administered 2015-09-04: 2 mg via INTRAVENOUS

## 2015-09-04 MED ORDER — IOPAMIDOL (ISOVUE-370) INJECTION 76%
INTRAVENOUS | Status: DC | PRN
  Administered 2015-09-04: 10 mL

## 2015-09-04 MED ORDER — NEOSTIGMINE METHYLSULFATE 10 MG/10ML IV SOLN
INTRAVENOUS | Status: DC | PRN
  Administered 2015-09-04: 5 mg via INTRAVENOUS

## 2015-09-04 MED ORDER — DEXAMETHASONE SODIUM PHOSPHATE 10 MG/ML IJ SOLN
INTRAMUSCULAR | Status: AC
  Filled 2015-09-04: qty 1

## 2015-09-04 MED ORDER — DEXAMETHASONE SODIUM PHOSPHATE 4 MG/ML IJ SOLN
INTRAMUSCULAR | Status: DC | PRN
  Administered 2015-09-04: 10 mg via INTRAVENOUS

## 2015-09-04 MED ORDER — ONDANSETRON HCL 4 MG/2ML IJ SOLN
INTRAMUSCULAR | Status: DC | PRN
  Administered 2015-09-04: 4 mg via INTRAVENOUS

## 2015-09-04 MED ORDER — PROPOFOL 10 MG/ML IV BOLUS
INTRAVENOUS | Status: DC | PRN
  Administered 2015-09-04: 250 mg via INTRAVENOUS

## 2015-09-04 MED ORDER — KETOROLAC TROMETHAMINE 30 MG/ML IJ SOLN
INTRAMUSCULAR | Status: AC
  Filled 2015-09-04: qty 1

## 2015-09-04 MED ORDER — CEFAZOLIN SODIUM 1-5 GM-% IV SOLN
1.0000 g | INTRAVENOUS | Status: DC
  Filled 2015-09-04: qty 50

## 2015-09-04 MED ORDER — SUCCINYLCHOLINE CHLORIDE 20 MG/ML IJ SOLN
INTRAMUSCULAR | Status: DC | PRN
  Administered 2015-09-04: 100 mg via INTRAVENOUS

## 2015-09-04 MED ORDER — OXYCODONE-ACETAMINOPHEN 5-325 MG PO TABS: 1.0000 | ORAL_TABLET | ORAL | Status: DC | PRN

## 2015-09-04 MED ORDER — MIDAZOLAM HCL 2 MG/2ML IJ SOLN
INTRAMUSCULAR | Status: AC
  Filled 2015-09-04: qty 2

## 2015-09-04 MED ORDER — ONDANSETRON HCL 4 MG/2ML IJ SOLN
INTRAMUSCULAR | Status: AC
  Filled 2015-09-04: qty 2

## 2015-09-04 MED ORDER — FENTANYL CITRATE (PF) 100 MCG/2ML IJ SOLN
25.0000 ug | INTRAMUSCULAR | Status: DC | PRN
  Administered 2015-09-04: 50 ug via INTRAVENOUS
  Filled 2015-09-04: qty 1

## 2015-09-04 MED ORDER — GLYCOPYRROLATE 0.2 MG/ML IJ SOLN
INTRAMUSCULAR | Status: DC | PRN
  Administered 2015-09-04: 0.6 mg via INTRAVENOUS

## 2015-09-04 MED ORDER — FENTANYL CITRATE (PF) 100 MCG/2ML IJ SOLN
INTRAMUSCULAR | Status: DC | PRN
  Administered 2015-09-04 (×4): 50 ug via INTRAVENOUS

## 2015-09-04 MED ORDER — METOCLOPRAMIDE HCL 5 MG/ML IJ SOLN
INTRAMUSCULAR | Status: AC
  Filled 2015-09-04: qty 2

## 2015-09-04 MED ORDER — LIDOCAINE HCL (CARDIAC) 20 MG/ML IV SOLN
INTRAVENOUS | Status: DC | PRN
  Administered 2015-09-04: 100 mg via INTRAVENOUS

## 2015-09-04 SURGICAL SUPPLY — 33 items
BAG DRAIN URO-CYSTO SKYTR STRL (DRAIN) ×8 IMPLANT
BAG DRN ANRFLXCHMBR STRAP LEK (BAG)
BAG DRN UROCATH (DRAIN) ×4
BAG URINE DRAINAGE (UROLOGICAL SUPPLIES) ×2 IMPLANT
BAG URINE LEG 19OZ MD ST LTX (BAG) IMPLANT
BAG URINE LEG 500ML (DRAIN) IMPLANT
BASKET ZERO TIP NITINOL 2.4FR (BASKET) IMPLANT
BSKT STON RTRVL ZERO TP 2.4FR (BASKET)
CATH FOLEY 2WAY SLVR  5CC 22FR (CATHETERS)
CATH FOLEY 2WAY SLVR 30CC 20FR (CATHETERS) IMPLANT
CATH FOLEY 2WAY SLVR 30CC 22FR (CATHETERS) ×2 IMPLANT
CATH FOLEY 2WAY SLVR 5CC 22FR (CATHETERS) IMPLANT
CATH INTERMIT  6FR 70CM (CATHETERS) IMPLANT
CLOTH BEACON ORANGE TIMEOUT ST (SAFETY) ×4 IMPLANT
ELECT REM PT RETURN 9FT ADLT (ELECTROSURGICAL) ×4
ELECTRODE REM PT RTRN 9FT ADLT (ELECTROSURGICAL) ×2 IMPLANT
EVACUATOR MICROVAS BLADDER (UROLOGICAL SUPPLIES) IMPLANT
GLOVE BIO SURGEON STRL SZ8 (GLOVE) ×4 IMPLANT
GOWN STRL REUS W/ TWL LRG LVL3 (GOWN DISPOSABLE) ×4 IMPLANT
GOWN STRL REUS W/ TWL XL LVL3 (GOWN DISPOSABLE) ×2 IMPLANT
GOWN STRL REUS W/TWL LRG LVL3 (GOWN DISPOSABLE) ×8
GOWN STRL REUS W/TWL XL LVL3 (GOWN DISPOSABLE) ×4
GUIDEWIRE ANG ZIPWIRE 038X150 (WIRE) IMPLANT
GUIDEWIRE STR DUAL SENSOR (WIRE) ×4 IMPLANT
IV NS IRRIG 3000ML ARTHROMATIC (IV SOLUTION) ×4 IMPLANT
KIT ROOM TURNOVER WOR (KITS) ×4 IMPLANT
LOOP CUT BIPOLAR 24F LRG (ELECTROSURGICAL) ×2 IMPLANT
MANIFOLD NEPTUNE II (INSTRUMENTS) IMPLANT
PACK CYSTO (CUSTOM PROCEDURE TRAY) ×4 IMPLANT
SET ASPIRATION TUBING (TUBING) IMPLANT
SYRINGE IRR TOOMEY STRL 70CC (SYRINGE) IMPLANT
TUBE CONNECTING 12'X1/4 (SUCTIONS)
TUBE CONNECTING 12X1/4 (SUCTIONS) IMPLANT

## 2015-09-04 NOTE — Discharge Instructions (Signed)
Transurethral Resection, Bladder Tumor A cancerous growth (tumor) can develop on the inside wall of the bladder. The bladder is the organ that holds urine. One way to remove the tumor is a procedure called a transurethral resection. The tumor is removed (resected) through the tube that carries urine from the bladder out of the body (urethra). No cuts (incisions) are made in the skin. Instead, the procedure is done through a thin telescope, called a resectoscope. Attached to it is a light and usually a tiny camera. The resectoscope is put into the urethra. In men, the urethra opens at the end of the penis. In women, it opens just above the vagina.  A transurethral resection is usually used to remove tumors that have not gotten too big or too deep. These are called Stage 0, Stage 1 or Stage 2 bladder cancers. LET YOUR CAREGIVER KNOW ABOUT:  On the day of the procedure, your caregivers will need to know the last time you had anything to eat or drink. This includes water, gum, and candy. In advance, make sure they know about:  1. Any allergies. 2. All medications you are taking, including:  Herbs, eyedrops, over-the-counter medications and creams.  Blood thinners (anticoagulants), aspirin or other drugs that could affect blood clotting. 3. Use of steroids (by mouth or as creams). 4. Previous problems with anesthetics, including local anesthetics. 5. Possibility of pregnancy, if this applies. 6. Any history of blood clots. 7. Any history of bleeding or other blood problems. 8. Previous surgery. 9. Smoking history. 10. Any recent symptoms of colds or infections. 11. Other health problems. RISKS AND COMPLICATIONS This is usually a safe procedure. Every procedure has risks, though. For a transurethral resection, they include: 1. Infection. Antibiotic medication would need to be taken. 2. Bleeding. 1. Light bleeding may last for several days after the procedure. 2. If bleeding continues or is heavy,  the bladder may need rinsing. Or, a new catheter might be put in for awhile. 3. Sometimes bed rest is needed. 3. Urination problems. 1. Pain and burning can occur when urinating. This usually goes away in a few days. 2. Scarring from the procedure can block the flow of urine. 4. Bladder damage. 1. It can be punctured or torn during removal of the tumor. If this happens, a catheter might be needed for longer. Antibiotics would be taken while the bladder heals. 2. Urine can leak through the hole or tear into the abdomen. If this happens, surgery may be needed to repair the bladder. BEFORE THE PROCEDURE  1. A medical evaluation will be done. This may include: 1. A physical examination. 2. Urine test. This is to make sure you do not have a urinary tract infection. 3. Blood tests. 4. A test that checks the heart's rhythm (electrocardiogram). 5. Talking with an anesthesiologist. This is the person who will be in charge of the medication (anesthesia) to keep you from feeling pain during the transurethral resection. You might be asleep during the procedure (general anesthesia) or numb from the waist down, but awake during the procedure (spinal anesthesia). Ask your surgeon what to expect. 2. The person who is having a transurethral resection needs to give what is called informed consent. This requires signing a legal paper that gives permission for the procedure. To give informed consent: 1. You must understand how the procedure is done and why. 2. You must be told all the risks and benefits of the procedure. 3. You must sign the consent. Sometimes a legal guardian  can do this. 4. Signing should be witnessed by a healthcare professional. 3. The day before the surgery, eat only a light dinner. Then, do not eat or drink anything for at least 8 hours before the surgery. Ask your caregiver if it is OK to take any needed medicines with a sip of water. 4. Arrive at least an hour before the surgery or whenever  your surgeon recommends. This will give you time to check in and fill out any needed paperwork. PROCEDURE 1. The preparation: 1. You will change into a hospital gown. 2. A needle will be inserted in your arm. This is an intravenous access tube (IV). Medication will be able to flow directly into your body through this needle. 3. Small monitors will be put on your body. They are used to check your heart, blood pressure, and oxygen level. 4. You might be given medication that will help you relax (sedative). 5. You will be given a general anesthetic or spinal anesthesia. 2. The procedure: 1. Once you are asleep or numb from the waist down, your legs will be placed in stirrups. 2. The resectoscope will be passed through the urethra into the bladder. 3. Fluid will be passed through the resectoscope. This will fill the bladder with water. 4. The surgeon will examine the bladder through the scope. If the scope has a camera, it can take pictures from inside the bladder. They can be projected onto a TV screen. 5. The surgeon will use various tools to remove the tumor in small pieces. Sometimes a laser (a beam of light energy) is used. Other tools may use electric current. 6. A tube (catheter) will often be placed so that urine can drain into a bag outside the body. This process helps stop bleeding. This tube keeps blood clots from blocking the urethra. 7. The procedure usually takes 30 to 45 minutes. AFTER THE PROCEDURE   You will stay in a recovery area until the anesthesia has worn off. Your blood pressure and pulse will be checked every so often. Then you will be taken to a hospital room.  You may continue to get fluids through the IV for awhile.  Some pain is normal. The catheter might be uncomfortable. Pain is usually not severe. If it is, ask for pain medicine.  Your urine may look bloody after a transurethral resection. This is normal.  If bleeding is heavy, a hospital caregiver may rinse out  the bladder (irrigation) through the catheter.  Once the urine is clear, the catheter will be taken out.  You will need to stay in the hospital until you can urinate on your own.  Most people stay in the hospital for up to 4 days. PROGNOSIS   Transurethral resection is considered the best way to treat bladder tumors that are not too far along. For most people, the treatment is successful. Sometimes, though, more treatment is needed.  Bladder cancers can come back even after a successful procedure. Because of this, be sure to have a checkup with your caregiver every 3 to 6 months. If everything is OK for 3 years, you can reduce the checkups to once a year.   This information is not intended to replace advice given to you by your health care provider. Make sure you discuss any questions you have with your health care provider.   Document Released: 02/26/2009 Document Revised: 07/25/2011 Document Reviewed: 05/04/2009 Elsevier Interactive Patient Education 2016 [place] Instructions  Activity:  Get plenty of rest for the remainder of the day. A responsible adult should stay with you for 24 hours following the procedure.  For the next 24 hours, DO NOT: -Drive a car -Paediatric nurse -Drink alcoholic beverages -Take any medication unless instructed by your physician -Make any legal decisions or sign important papers.  Meals: Start with liquid foods such as gelatin or soup. Progress to regular foods as tolerated. Avoid greasy, spicy, heavy foods. If nausea and/or vomiting occur, drink only clear liquids until the nausea and/or vomiting subsides. Call your physician if vomiting continues.  Special Instructions/Symptoms: Your throat may feel dry or sore from the anesthesia or the breathing tube placed in your throat during surgery. If this causes discomfort, gargle with warm salt water. The discomfort should disappear within 24 hours.  If you had a  scopolamine patch placed behind your ear for the management of post- operative nausea and/or vomiting:  1. The medication in the patch is effective for 72 hours, after which it should be removed.  Wrap patch in a tissue and discard in the trash. Wash hands thoroughly with soap and water. 2. You may remove the patch earlier than 72 hours if you experience unpleasant side effects which may include dry mouth, dizziness or visual disturbances. 3. Avoid touching the patch. Wash your hands with soap and water after contact with the patch.

## 2015-09-04 NOTE — Brief Op Note (Signed)
09/04/2015  2:55 PM  PATIENT:  Christian [name]  55 y.o. male  PRE-OPERATIVE DIAGNOSIS:  BLADDER TUMOR  POST-OPERATIVE DIAGNOSIS:  BLADDER TUMOR  PROCEDURE:  Procedure(s): TRANSURETHRAL RESECTION OF BLADDER TUMOR WITH GYRUS (TURBT-GYRUS) (N/A) CYSTOSCOPY WITH RETROGRADE PYELOGRAM (Bilateral)  SURGEON:  Surgeon(s) and Role:    * Cleon Gustin, MD - Primary  PHYSICIAN ASSISTANT:   ASSISTANTS: none   ANESTHESIA:   general  EBL:  Total I/O In: 1000 [I.V.:1000] Out: 10 [Blood:10]  BLOOD ADMINISTERED:none  DRAINS: Urinary Catheter (Foley)   LOCAL MEDICATIONS USED:  NONE  SPECIMEN:  Source of Specimen:  left lateral wall bladder tumor  DISPOSITION OF SPECIMEN:  PATHOLOGY  COUNTS:  YES  TOURNIQUET:  * No tourniquets in log *  DICTATION: .Note written in EPIC  PLAN OF CARE: Discharge to home after PACU  PATIENT DISPOSITION:  PACU - hemodynamically stable.   Delay start of Pharmacological VTE agent (>24hrs) due to surgical blood loss or risk of bleeding: not applicable

## 2015-09-04 NOTE — Anesthesia Procedure Notes (Addendum)
Procedure Name: Intubation Date/Time: 09/04/2015 2:15 PM Performed by: Mechele Claude Pre-anesthesia Checklist: Patient identified, Emergency Drugs available, Suction available and Patient being monitored Patient Re-evaluated:Patient Re-evaluated prior to inductionOxygen Delivery Method: Circle System Utilized Preoxygenation: Pre-oxygenation with 100% oxygen Intubation Type: IV induction Ventilation: Two handed mask ventilation required and Oral airway inserted - appropriate to patient size Laryngoscope Size: Mac and 4 Grade View: Grade III Tube type: Oral Tube size: 8.0 mm Number of attempts: 1 Airway Equipment and Method: Stylet and Oral airway Placement Confirmation: ETT inserted through vocal cords under direct vision,  positive ETCO2 and breath sounds checked- equal and bilateral Secured at: 23 cm Tube secured with: Tape Dental Injury: Teeth and Oropharynx as per pre-operative assessment  Difficulty Due To: Difficult Airway- due to anterior larynx and Difficulty was anticipated Comments: Required pillow removed from behind head, and extra hand to hold head up during laryngoscopy.
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
## 2015-09-04 NOTE — Anesthesia Preprocedure Evaluation (Addendum)
Anesthesia Evaluation  Patient identified by MRN, date of birth, ID band Patient awake    Reviewed: Allergy & Precautions, NPO status , Patient's Chart, lab work & pertinent test results  Airway Mallampati: I  TM Distance: >3 FB Neck ROM: Full    Dental  (+) Teeth Intact   Pulmonary asthma , Current Smoker,    breath sounds clear to auscultation       Cardiovascular hypertension, Pt. on medications  Rhythm:Regular Rate:Normal  Denies active cardiac symptoms.    Neuro/Psych PSYCHIATRIC DISORDERS Anxiety Depression negative neurological ROS     GI/Hepatic Neg liver ROS, GERD  Medicated and Controlled,  Endo/Other  negative endocrine ROS  Renal/GU negative Renal ROS  negative genitourinary   Musculoskeletal negative musculoskeletal ROS (+)   Abdominal   Peds negative pediatric ROS (+)  Hematology negative hematology ROS (+)   Anesthesia Other Findings - HLD  Reproductive/Obstetrics negative OB ROS                            Anesthesia Physical Anesthesia Plan  ASA: III  Anesthesia Plan: General   Post-op Pain Management:    Induction: Intravenous  Airway Management Planned: LMA  Additional Equipment:   Intra-op Plan:   Post-operative Plan: Extubation in OR  Informed Consent: I have reviewed the patients History and Physical, chart, labs and discussed the procedure including the risks, benefits and alternatives for the proposed anesthesia with the patient or authorized representative who has indicated his/her understanding and acceptance.   Dental advisory given  Plan Discussed with: CRNA  Anesthesia Plan Comments:         Anesthesia Quick Evaluation

## 2015-09-04 NOTE — Transfer of Care (Signed)
   Last Vitals:  Filed Vitals:   09/04/15 1222  BP: 153/95  Pulse: 72  Temp: 37 C  Resp: 16    Immediate Anesthesia Transfer of Care Note  Patient: Christian [name]  Procedure(s) Performed: Procedure(s) (LRB): TRANSURETHRAL RESECTION OF BLADDER TUMOR WITH GYRUS (TURBT-GYRUS) (N/A) CYSTOSCOPY WITH RETROGRADE PYELOGRAM (Bilateral)  Patient Location: PACU  Anesthesia Type: General  Level of Consciousness: awake, alert  and oriented  Airway & Oxygen Therapy: Patient Spontanous Breathing and Patient connected to face mask oxygen  Post-op Assessment: Report given to PACU RN and Post -op Vital signs reviewed and stable  Post vital signs: Reviewed and stable  Complications: No apparent anesthesia complications

## 2015-09-04 NOTE — Anesthesia Postprocedure Evaluation (Signed)
Anesthesia Post Note  Patient: Christian [name]  Procedure(s) Performed: Procedure(s) (LRB): TRANSURETHRAL RESECTION OF BLADDER TUMOR WITH GYRUS (TURBT-GYRUS) (N/A) CYSTOSCOPY WITH RETROGRADE PYELOGRAM (Bilateral)  Patient location during evaluation: PACU Anesthesia Type: General Level of consciousness: awake and alert Pain management: pain level controlled Vital Signs Assessment: post-procedure vital signs reviewed and stable Respiratory status: spontaneous breathing, nonlabored ventilation, respiratory function stable and patient connected to nasal cannula oxygen Cardiovascular status: blood pressure returned to baseline and stable Postop Assessment: no signs of nausea or vomiting Anesthetic complications: no    Last Vitals:  Filed Vitals:   09/04/15 1600 09/04/15 1641  BP: 140/76 151/88  Pulse: 74 72  Temp:  36.7 C  Resp: 15 14    Last Pain:  Filed Vitals:   09/04/15 1645  PainSc: 6                  [name] J

## 2015-09-07 ENCOUNTER — Encounter (HOSPITAL_BASED_OUTPATIENT_CLINIC_OR_DEPARTMENT_OTHER): Payer: Self-pay | Admitting: Urology

## 2015-09-07 NOTE — Op Note (Signed)
.  Preoperative diagnosis: bladder tumor  Postoperative diagnosis: Same  Procedure: 1 cystoscopy 2. bilateral retrograde pyelography 3.  Intraoperative fluoroscopy, under one hour, with interpretation 4.  Transurethral resection of bladder tumor, large 5cm  Attending: Rosie Fate  Anesthesia: General  Estimated blood loss: Minimal  Drains: 22 French foley  Specimens: bladder tumor  Antibiotics: ancef  Findings:  5cm papillary left lateral wall tumor with numerous 1-2cm satellite lesions.  Ureteral orifices in normal anatomic location. No hydronephrosis or filling defects in either collecting system  Indications: Patient is a 55 year old male/male with a history of bladder tumor and gross hematuria.  After discussing treatment options, they decided proceed with transurethral resection of a bladder tumor.  Procedure her in detail: The patient was brought to the operating room and a brief timeout was done to ensure correct patient, correct procedure, correct site.  General anesthesia was administered patient was placed in dorsal lithotomy position.  Their genitalia was then prepped and draped in usual sterile fashion.  A rigid 27 French cystoscope was passed in the urethra and the bladder.  Bladder was inspected and we noted a 5cm bladder tumor with numerous satellite lesions.  the ureteral orifices were in the normal orthotopic locations.  a 6 french ureteral catheter was then instilled into the left ureteral orifice.  a gentle retrograde was obtained and findings noted above. We then turned our attention to the right side. a 6 french ureteral catheter was then instilled into the right ureteral orifice.  a gentle retrograde was obtained and findings noted above. We then removed the cystoscope and placed a resectoscope into the bladder. We proceeded to remove the large clot burden from the bladder. Once this was complete we turned our attention to the bladder tumor. Using the bipolar  resectoscope we removed the bladder tumor down to the base. A subsequent muscle deep biopsy was then taken. Hemostasis was then obtained with electrocautery. We then removed the bladder tumor chips and sent them for pathology. We then re-inspected the bladder and found no residula bleeding.  the bladder was then drained, a 22 French foley was placed and this concluded the procedure which was well tolerated by patient.  Complications: None  Condition: Stable, extubated, transferred to PACU  Plan: Patient is to be discharged home and followup in 5 days for foley catheter removal and pathology discussion.

## 2015-09-18 ENCOUNTER — Other Ambulatory Visit: Payer: Self-pay | Admitting: Urology

## 2015-09-30 ENCOUNTER — Encounter: Payer: Self-pay | Admitting: Podiatry

## 2015-09-30 ENCOUNTER — Ambulatory Visit (INDEPENDENT_AMBULATORY_CARE_PROVIDER_SITE_OTHER): Payer: 59 | Admitting: Podiatry

## 2015-09-30 VITALS — BP 185/117 | HR 71 | Resp 16

## 2015-09-30 DIAGNOSIS — L03031 Cellulitis of right toe: Secondary | ICD-10-CM

## 2015-09-30 DIAGNOSIS — L03012 Cellulitis of left finger: Secondary | ICD-10-CM

## 2015-09-30 DIAGNOSIS — L600 Ingrowing nail: Secondary | ICD-10-CM

## 2015-09-30 NOTE — Patient Instructions (Signed)

## 2015-09-30 NOTE — Progress Notes (Signed)
Subjective:     Patient ID: Christian [name], male   DOB: Mar 13, 1961, 55 y.o.   MRN: [id number]  HPI patient states I did not come in the way I was supposed toe and I know that I have to get this fixed permanently   Review of Systems     Objective:   Physical Exam Neurovascular status intact with erythematous left hallux medial border that is draining with quite a bit of distal redness proud flesh and necrotic tissue formation    Assessment:     Significant localized paronychia infection left hallux    Plan:     H&P and condition reviewed. Today I recommended again that this will require a two-part process and I infiltrated the left hallux 60 mg Xylocaine Marcaine mixture removed the lateral border removed all proud flesh and created a channel for drainage. Applied sterile dressing and reappoint to recheck

## 2015-10-09 ENCOUNTER — Encounter (HOSPITAL_BASED_OUTPATIENT_CLINIC_OR_DEPARTMENT_OTHER): Payer: Self-pay | Admitting: *Deleted

## 2015-10-09 ENCOUNTER — Telehealth: Payer: Self-pay | Admitting: *Deleted

## 2015-10-09 NOTE — Progress Notes (Addendum)
SPOKE W/ PT'S WIFE. NPO AFTER MN WITH EXCEPTION CLEAR LIQUIDS UNTIL 0630.  ARRIVE AT 1115.  NEEDS ISTAT.  CURRENT EKG IN CHART AND EPIC. WILL TAKE ZANTAC AM DOS W SIPS OF WATER .

## 2015-10-09 NOTE — Telephone Encounter (Signed)
Left message for patient at [phone] (Home #) to check to see how they were doing from their Paronychia procedure that was performed on Wednesday, Sep 30, 2015. Waiting for a response.

## 2015-10-14 ENCOUNTER — Encounter: Payer: Self-pay | Admitting: Podiatry

## 2015-10-14 ENCOUNTER — Ambulatory Visit (INDEPENDENT_AMBULATORY_CARE_PROVIDER_SITE_OTHER): Payer: 59 | Admitting: Podiatry

## 2015-10-14 DIAGNOSIS — L600 Ingrowing nail: Secondary | ICD-10-CM | POA: Diagnosis not present

## 2015-10-14 NOTE — Patient Instructions (Signed)

## 2015-10-14 NOTE — Progress Notes (Signed)
Subjective:     Patient ID: Christian [name], male   DOB: [birth year], 55 y.o.   MRN: [id number]  HPI patient presents stating I have done well with my recurrent ingrown and I wanted fixed permanently on both sides   Review of Systems     Objective:   Physical Exam Neurovascular status intact with incurvated hallux nail left with pain in the medial and lateral side of the left hallux nail chronic in nature that had been infected but is doing much better    Assessment:     Chronic ingrown left hallux nail medial lateral border    Plan:     Recommended removal of corners and patient wants procedure and explained risk. Today I infiltrated the hallux 60 mg like Marcaine mixture and removed the medial lateral borders removed all proud flesh and creating channels and exposed matrix and applied phenol 3 applications 30 seconds to each border followed by lavaged alcohol and sterile dressing. Gave instructions on soaks and reappoint

## 2015-10-16 ENCOUNTER — Ambulatory Visit (HOSPITAL_BASED_OUTPATIENT_CLINIC_OR_DEPARTMENT_OTHER): Payer: 59 | Admitting: Anesthesiology

## 2015-10-16 ENCOUNTER — Encounter (HOSPITAL_BASED_OUTPATIENT_CLINIC_OR_DEPARTMENT_OTHER)
Admission: RE | Disposition: A | Discharge status: Home / Self Care | Payer: Self-pay | Source: Ambulatory Visit | Attending: Urology

## 2015-10-16 ENCOUNTER — Ambulatory Visit (HOSPITAL_BASED_OUTPATIENT_CLINIC_OR_DEPARTMENT_OTHER)
Admission: RE | Admit: 2015-10-16 | Discharge: 2015-10-16 | Disposition: A | Discharge status: Home / Self Care | Payer: 59 | Source: Ambulatory Visit | Attending: Urology | Admitting: Urology

## 2015-10-16 ENCOUNTER — Encounter (HOSPITAL_BASED_OUTPATIENT_CLINIC_OR_DEPARTMENT_OTHER): Payer: Self-pay | Admitting: Anesthesiology

## 2015-10-16 DIAGNOSIS — K219 Gastro-esophageal reflux disease without esophagitis: Secondary | ICD-10-CM | POA: Insufficient documentation

## 2015-10-16 DIAGNOSIS — I452 Bifascicular block: Secondary | ICD-10-CM | POA: Diagnosis not present

## 2015-10-16 DIAGNOSIS — I10 Essential (primary) hypertension: Secondary | ICD-10-CM | POA: Diagnosis not present

## 2015-10-16 DIAGNOSIS — Z888 Allergy status to other drugs, medicaments and biological substances status: Secondary | ICD-10-CM | POA: Diagnosis not present

## 2015-10-16 DIAGNOSIS — D494 Neoplasm of unspecified behavior of bladder: Secondary | ICD-10-CM | POA: Diagnosis present

## 2015-10-16 DIAGNOSIS — F17200 Nicotine dependence, unspecified, uncomplicated: Secondary | ICD-10-CM | POA: Diagnosis not present

## 2015-10-16 DIAGNOSIS — E785 Hyperlipidemia, unspecified: Secondary | ICD-10-CM | POA: Diagnosis not present

## 2015-10-16 DIAGNOSIS — I499 Cardiac arrhythmia, unspecified: Secondary | ICD-10-CM | POA: Diagnosis not present

## 2015-10-16 DIAGNOSIS — Z79899 Other long term (current) drug therapy: Secondary | ICD-10-CM | POA: Insufficient documentation

## 2015-10-16 DIAGNOSIS — F419 Anxiety disorder, unspecified: Secondary | ICD-10-CM | POA: Insufficient documentation

## 2015-10-16 DIAGNOSIS — C679 Malignant neoplasm of bladder, unspecified: Secondary | ICD-10-CM | POA: Insufficient documentation

## 2015-10-16 DIAGNOSIS — Z7982 Long term (current) use of aspirin: Secondary | ICD-10-CM | POA: Insufficient documentation

## 2015-10-16 DIAGNOSIS — J45909 Unspecified asthma, uncomplicated: Secondary | ICD-10-CM | POA: Diagnosis not present

## 2015-10-16 DIAGNOSIS — F329 Major depressive disorder, single episode, unspecified: Secondary | ICD-10-CM | POA: Diagnosis not present

## 2015-10-16 HISTORY — DX: Left anterior fascicular block: I44.4

## 2015-10-16 HISTORY — DX: Unspecified right bundle-branch block: I45.10

## 2015-10-16 HISTORY — DX: Malignant neoplasm of bladder, unspecified: C67.9

## 2015-10-16 HISTORY — PX: TRANSURETHRAL RESECTION OF BLADDER TUMOR WITH GYRUS (TURBT-GYRUS): SHX6458

## 2015-10-16 HISTORY — DX: Atrioventricular block, first degree: I44.0

## 2015-10-16 LAB — POCT I-STAT 4, (NA,K, GLUC, HGB,HCT)
GLUCOSE: 108 mg/dL — AB (ref 65–99)
HEMATOCRIT: 40 % (ref 39.0–52.0)
HEMOGLOBIN: 13.6 g/dL (ref 13.0–17.0)
POTASSIUM: 4.5 mmol/L (ref 3.5–5.1)
Sodium: 139 mmol/L (ref 135–145)

## 2015-10-16 SURGERY — TRANSURETHRAL RESECTION OF BLADDER TUMOR WITH GYRUS (TURBT-GYRUS)
Anesthesia: General

## 2015-10-16 MED ORDER — ROCURONIUM BROMIDE 100 MG/10ML IV SOLN
INTRAVENOUS | Status: AC
  Filled 2015-10-16: qty 1

## 2015-10-16 MED ORDER — SUCCINYLCHOLINE CHLORIDE 200 MG/10ML IV SOSY
PREFILLED_SYRINGE | INTRAVENOUS | Status: DC | PRN
  Administered 2015-10-16: 100 mg via INTRAVENOUS

## 2015-10-16 MED ORDER — OXYBUTYNIN CHLORIDE 5 MG PO TABS
ORAL_TABLET | ORAL | Status: AC
  Filled 2015-10-16: qty 1

## 2015-10-16 MED ORDER — OXYCODONE-ACETAMINOPHEN 5-325 MG PO TABS
ORAL_TABLET | ORAL | Status: AC
  Filled 2015-10-16: qty 1

## 2015-10-16 MED ORDER — PROMETHAZINE HCL 25 MG/ML IJ SOLN
6.2500 mg | INTRAMUSCULAR | Status: DC | PRN
  Filled 2015-10-16: qty 1

## 2015-10-16 MED ORDER — OXYBUTYNIN CHLORIDE 5 MG PO TABS
5.0000 mg | ORAL_TABLET | Freq: Three times a day (TID) | ORAL | Status: DC
  Administered 2015-10-16: 5 mg via ORAL
  Filled 2015-10-16: qty 1

## 2015-10-16 MED ORDER — ROCURONIUM BROMIDE 100 MG/10ML IV SOLN
INTRAVENOUS | Status: DC | PRN
  Administered 2015-10-16: 40 mg via INTRAVENOUS

## 2015-10-16 MED ORDER — LACTATED RINGERS IV SOLN
INTRAVENOUS | Status: DC
  Administered 2015-10-16 (×2): via INTRAVENOUS
  Filled 2015-10-16: qty 1000

## 2015-10-16 MED ORDER — PROPOFOL 10 MG/ML IV BOLUS
INTRAVENOUS | Status: DC | PRN
  Administered 2015-10-16: 300 mg via INTRAVENOUS

## 2015-10-16 MED ORDER — EPHEDRINE SULFATE 50 MG/ML IJ SOLN
INTRAMUSCULAR | Status: DC | PRN
  Administered 2015-10-16: 20 mg via INTRAVENOUS
  Administered 2015-10-16 (×3): 10 mg via INTRAVENOUS

## 2015-10-16 MED ORDER — SUGAMMADEX SODIUM 200 MG/2ML IV SOLN
INTRAVENOUS | Status: AC
Start: 2015-10-16 — End: 2015-10-16
  Filled 2015-10-16: qty 2

## 2015-10-16 MED ORDER — OXYBUTYNIN CHLORIDE 5 MG PO TABS: 5.0000 mg | ORAL_TABLET | Freq: Three times a day (TID) | ORAL | Status: DC

## 2015-10-16 MED ORDER — DEXTROSE 5 % IV SOLN
2.0000 g | INTRAVENOUS | Status: AC
  Administered 2015-10-16: 2 g via INTRAVENOUS
  Filled 2015-10-16: qty 2

## 2015-10-16 MED ORDER — METOCLOPRAMIDE HCL 5 MG/ML IJ SOLN
INTRAMUSCULAR | Status: DC | PRN
  Administered 2015-10-16: 10 mg via INTRAVENOUS

## 2015-10-16 MED ORDER — LIDOCAINE HCL (CARDIAC) 20 MG/ML IV SOLN
INTRAVENOUS | Status: AC
  Filled 2015-10-16: qty 5

## 2015-10-16 MED ORDER — BELLADONNA ALKALOIDS-OPIUM 16.2-60 MG RE SUPP
RECTAL | Status: AC
  Filled 2015-10-16: qty 1

## 2015-10-16 MED ORDER — EPHEDRINE SULFATE 50 MG/ML IJ SOLN
INTRAMUSCULAR | Status: AC
  Filled 2015-10-16: qty 1

## 2015-10-16 MED ORDER — PROPOFOL 10 MG/ML IV BOLUS
INTRAVENOUS | Status: AC
  Filled 2015-10-16: qty 20

## 2015-10-16 MED ORDER — LIDOCAINE HCL (CARDIAC) 20 MG/ML IV SOLN
INTRAVENOUS | Status: DC | PRN
  Administered 2015-10-16: 60 mg via INTRAVENOUS

## 2015-10-16 MED ORDER — MIDAZOLAM HCL 5 MG/5ML IJ SOLN
INTRAMUSCULAR | Status: DC | PRN
  Administered 2015-10-16: 2 mg via INTRAVENOUS

## 2015-10-16 MED ORDER — FENTANYL CITRATE (PF) 100 MCG/2ML IJ SOLN
INTRAMUSCULAR | Status: AC
Start: 2015-10-16 — End: 2015-10-16
  Filled 2015-10-16: qty 2

## 2015-10-16 MED ORDER — SUGAMMADEX SODIUM 200 MG/2ML IV SOLN
INTRAVENOUS | Status: DC | PRN
  Administered 2015-10-16: 200 mg via INTRAVENOUS

## 2015-10-16 MED ORDER — KETOROLAC TROMETHAMINE 30 MG/ML IJ SOLN
INTRAMUSCULAR | Status: AC
  Filled 2015-10-16: qty 1

## 2015-10-16 MED ORDER — OXYCODONE-ACETAMINOPHEN 5-325 MG PO TABS: 1.0000 | ORAL_TABLET | ORAL | Status: DC | PRN

## 2015-10-16 MED ORDER — OXYCODONE-ACETAMINOPHEN 5-325 MG PO TABS
1.0000 | ORAL_TABLET | ORAL | Status: DC | PRN
  Administered 2015-10-16: 1 via ORAL
  Filled 2015-10-16: qty 1

## 2015-10-16 MED ORDER — CEFTRIAXONE SODIUM 2 G IJ SOLR
INTRAMUSCULAR | Status: AC
  Filled 2015-10-16: qty 2

## 2015-10-16 MED ORDER — METOCLOPRAMIDE HCL 5 MG/ML IJ SOLN
INTRAMUSCULAR | Status: AC
  Filled 2015-10-16: qty 2

## 2015-10-16 MED ORDER — DEXAMETHASONE SODIUM PHOSPHATE 4 MG/ML IJ SOLN
INTRAMUSCULAR | Status: DC | PRN
  Administered 2015-10-16: 10 mg via INTRAVENOUS

## 2015-10-16 MED ORDER — ONDANSETRON HCL 4 MG/2ML IJ SOLN
INTRAMUSCULAR | Status: DC | PRN
  Administered 2015-10-16: 4 mg via INTRAVENOUS

## 2015-10-16 MED ORDER — KETOROLAC TROMETHAMINE 30 MG/ML IJ SOLN
INTRAMUSCULAR | Status: DC | PRN
  Administered 2015-10-16: 30 mg via INTRAVENOUS

## 2015-10-16 MED ORDER — FENTANYL CITRATE (PF) 100 MCG/2ML IJ SOLN
INTRAMUSCULAR | Status: DC | PRN
  Administered 2015-10-16 (×4): 50 ug via INTRAVENOUS

## 2015-10-16 MED ORDER — ONDANSETRON HCL 4 MG/2ML IJ SOLN
INTRAMUSCULAR | Status: AC
  Filled 2015-10-16: qty 2

## 2015-10-16 MED ORDER — MIDAZOLAM HCL 2 MG/2ML IJ SOLN
INTRAMUSCULAR | Status: AC
  Filled 2015-10-16: qty 2

## 2015-10-16 MED ORDER — DEXAMETHASONE SODIUM PHOSPHATE 10 MG/ML IJ SOLN
INTRAMUSCULAR | Status: AC
  Filled 2015-10-16: qty 1

## 2015-10-16 MED ORDER — DEXTROSE 5 % IV SOLN
INTRAVENOUS | Status: AC
  Filled 2015-10-16: qty 50

## 2015-10-16 MED ORDER — FENTANYL CITRATE (PF) 100 MCG/2ML IJ SOLN
25.0000 ug | INTRAMUSCULAR | Status: DC | PRN
  Filled 2015-10-16: qty 1

## 2015-10-16 SURGICAL SUPPLY — 32 items
BAG DRAIN URO-CYSTO SKYTR STRL (DRAIN) ×3 IMPLANT
BAG DRN ANRFLXCHMBR STRAP LEK (BAG)
BAG DRN UROCATH (DRAIN) ×1
BAG URINE DRAINAGE (UROLOGICAL SUPPLIES) IMPLANT
BAG URINE LEG 19OZ MD ST LTX (BAG) IMPLANT
BAG URINE LEG 500ML (DRAIN) IMPLANT
CATH FOLEY 2WAY SLVR  5CC 22FR (CATHETERS)
CATH FOLEY 2WAY SLVR 30CC 20FR (CATHETERS) IMPLANT
CATH FOLEY 2WAY SLVR 30CC 22FR (CATHETERS) ×3 IMPLANT
CATH FOLEY 2WAY SLVR 5CC 22FR (CATHETERS) IMPLANT
CATH FOLEY 3WAY 30CC 22F (CATHETERS) IMPLANT
CLOTH BEACON ORANGE TIMEOUT ST (SAFETY) ×3 IMPLANT
ELECT REM PT RETURN 9FT ADLT (ELECTROSURGICAL) ×3
ELECTRODE REM PT RTRN 9FT ADLT (ELECTROSURGICAL) ×1 IMPLANT
EVACUATOR MICROVAS BLADDER (UROLOGICAL SUPPLIES) IMPLANT
GLOVE BIO SURGEON STRL SZ8 (GLOVE) ×3 IMPLANT
GLOVE BIOGEL PI IND STRL 7.5 (GLOVE) IMPLANT
GLOVE BIOGEL PI INDICATOR 7.5 (GLOVE) ×4
GLOVE SURG SS PI 7.0 STRL IVOR (GLOVE) ×2 IMPLANT
GLOVE SURG SS PI 7.5 STRL IVOR (GLOVE) ×2 IMPLANT
GOWN STRL REUS W/ TWL LRG LVL3 (GOWN DISPOSABLE) ×2 IMPLANT
GOWN STRL REUS W/TWL LRG LVL3 (GOWN DISPOSABLE) ×9 IMPLANT
KIT ROOM TURNOVER WOR (KITS) ×3 IMPLANT
LOOP CUT BIPOLAR 24F LRG (ELECTROSURGICAL) ×2 IMPLANT
MANIFOLD NEPTUNE II (INSTRUMENTS) IMPLANT
PACK CYSTO (CUSTOM PROCEDURE TRAY) ×3 IMPLANT
PLUG CATH AND CAP STER (CATHETERS) IMPLANT
SET ASPIRATION TUBING (TUBING) IMPLANT
SYR 20CC LL (SYRINGE) IMPLANT
SYRINGE IRR TOOMEY STRL 70CC (SYRINGE) IMPLANT
TUBE CONNECTING 12'X1/4 (SUCTIONS) ×1
TUBE CONNECTING 12X1/4 (SUCTIONS) ×1 IMPLANT

## 2015-10-16 NOTE — Anesthesia Procedure Notes (Signed)
Procedure Name: Intubation Date/Time: 10/16/2015 12:58 PM Performed by: Mechele Claude Pre-anesthesia Checklist: Patient identified, Emergency Drugs available, Suction available and Patient being monitored Patient Re-evaluated:Patient Re-evaluated prior to inductionOxygen Delivery Method: Circle system utilized Preoxygenation: Pre-oxygenation with 100% oxygen Intubation Type: IV induction Ventilation: Two handed mask ventilation required and Mask ventilation without difficulty Laryngoscope Size: Mac and 4 Grade View: Grade II Tube type: Oral Tube size: 8.0 mm Number of attempts: 1 Airway Equipment and Method: Stylet and LTA kit utilized Placement Confirmation: ETT inserted through vocal cords under direct vision,  positive ETCO2 and breath sounds checked- equal and bilateral Secured at: 23 cm Tube secured with: Tape Dental Injury: Teeth and Oropharynx as per pre-operative assessment  Comments: Able to visualize bottom of vocal cords. Easy, atraumatic oral intubation.

## 2015-10-16 NOTE — Anesthesia Postprocedure Evaluation (Signed)
Anesthesia Post Note  Patient: Christian [name]  Procedure(s) Performed: Procedure(s) (LRB): TRANSURETHRAL RESECTION OF BLADDER TUMOR WITH GYRUS (TURBT-GYRUS) (N/A)  Patient location during evaluation: PACU Anesthesia Type: General Level of consciousness: awake and alert and oriented Pain management: pain level controlled Vital Signs Assessment: post-procedure vital signs reviewed and stable Respiratory status: spontaneous breathing, nonlabored ventilation and respiratory function stable Cardiovascular status: blood pressure returned to baseline and stable Postop Assessment: no signs of nausea or vomiting Anesthetic complications: no    Last Vitals:  Filed Vitals:   10/16/15 1342 10/16/15 1345  BP: 130/87 93/60  Pulse: 90 91  Temp: 36.3 C   Resp: 13 15    Last Pain: There were no vitals filed for this visit.               [name].

## 2015-10-16 NOTE — Transfer of Care (Signed)
Last Vitals:  Filed Vitals:   10/16/15 1127  BP: 160/93  Pulse: 86  Temp: 36.9 C  Resp: 16    Last Pain: There were no vitals filed for this visit.    Patients Stated Pain Goal: 5 (10/16/15 1139)  Immediate Anesthesia Transfer of Care Note  Patient: Christian [name]  Procedure(s) Performed: Procedure(s) (LRB): TRANSURETHRAL RESECTION OF BLADDER TUMOR WITH GYRUS (TURBT-GYRUS) (N/A)  Patient Location: PACU  Anesthesia Type: General  Level of Consciousness: awake, alert  and oriented  Airway & Oxygen Therapy: Patient Spontanous Breathing and Patient connected to face mask oxygen  Post-op Assessment: Report given to PACU RN and Post -op Vital signs reviewed and stable  Post vital signs: Reviewed and stable  Complications: No apparent anesthesia complications

## 2015-10-16 NOTE — Anesthesia Preprocedure Evaluation (Addendum)
Anesthesia Evaluation  Patient identified by MRN, date of birth, ID band Patient awake    Reviewed: Allergy & Precautions, NPO status , Patient's Chart, lab work & pertinent test results  Airway Mallampati: II  TM Distance: >3 FB Neck ROM: Full    Dental no notable dental hx.    Pulmonary asthma , Current Smoker,    Pulmonary exam normal breath sounds clear to auscultation       Cardiovascular hypertension, Pt. on medications Normal cardiovascular exam+ dysrhythmias  Rhythm:Regular Rate:Normal  H/O RBBB, LAFB and first degree AVB.   Neuro/Psych PSYCHIATRIC DISORDERS Anxiety Depression H/O panic attacks.negative neurological ROS     GI/Hepatic GERD  Medicated,(+)     substance abuse  alcohol use,   Endo/Other  negative endocrine ROS  Renal/GU negative Renal ROS  negative genitourinary   Musculoskeletal negative musculoskeletal ROS (+)   Abdominal   Peds negative pediatric ROS (+)  Hematology negative hematology ROS (+)   Anesthesia Other Findings   Reproductive/Obstetrics negative OB ROS                          Anesthesia Physical Anesthesia Plan  ASA: III  Anesthesia Plan: General   Post-op Pain Management:    Induction: Intravenous  Airway Management Planned: Oral ETT  Additional Equipment:   Intra-op Plan:   Post-operative Plan: Extubation in OR  Informed Consent: I have reviewed the patients History and Physical, chart, labs and discussed the procedure including the risks, benefits and alternatives for the proposed anesthesia with the patient or authorized representative who has indicated his/her understanding and acceptance.   Dental advisory given  Plan Discussed with: CRNA  Anesthesia Plan Comments: (LMA vs ETT. Grade 3 view 09-04-15  1250: Dr. Alyson Ingles requests ETT.)      Anesthesia Quick Evaluation

## 2015-10-16 NOTE — Brief Op Note (Signed)
10/16/2015  1:31 PM  PATIENT:  Christian [name]  55 y.o. male  PRE-OPERATIVE DIAGNOSIS:  BLADDER CANCER  POST-OPERATIVE DIAGNOSIS:  BLADDER CANCER  PROCEDURE:  Procedure(s): TRANSURETHRAL RESECTION OF BLADDER TUMOR WITH GYRUS (TURBT-GYRUS) (N/A)  SURGEON:  Surgeon(s) and Role:    * Cleon Gustin, MD - Primary  PHYSICIAN ASSISTANT:   ASSISTANTS: none   ANESTHESIA:   general  EBL:  Total I/O In: 700 [I.V.:700] Out: -   BLOOD ADMINISTERED:none  DRAINS: Urinary Catheter (Foley)   LOCAL MEDICATIONS USED:  NONE  SPECIMEN:  Source of Specimen:  right lateral wall and dome tumors. previous left lateral wall resection site  DISPOSITION OF SPECIMEN:  PATHOLOGY  COUNTS:  YES  TOURNIQUET:  * No tourniquets in log *  DICTATION: .Note written in EPIC  PLAN OF CARE: Discharge to home after PACU  PATIENT DISPOSITION:  PACU - hemodynamically stable.   Delay start of Pharmacological VTE agent (>24hrs) due to surgical blood loss or risk of bleeding: not applicable
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
## 2015-10-16 NOTE — Discharge Instructions (Signed)
CYSTOSCOPY HOME CARE INSTRUCTIONS  Activity: Rest for the remainder of the day.  Do not drive or operate equipment today.  You may resume normal activities in one to two days as instructed by your physician.   Meals: Drink plenty of liquids and eat light foods such as gelatin or soup this evening.  You may return to a normal meal plan tomorrow.  Return to Work: You may return to work in one to two days or as instructed by your physician.  Special Instructions / Symptoms: Call your physician if any of these symptoms occur:   -persistent or heavy bleeding  -bleeding which continues after first few urination  -large blood clots that are difficult to pass  -urine stream diminishes or stops completely  -fever equal to or higher than 101 degrees Farenheit.  -cloudy urine with a strong, foul odor  -severe pain  Females should always wipe from front to back after elimination.  You may feel some burning pain when you urinate.  This should disappear with time.  Applying moist heat to the lower abdomen or a hot tub bath may help relieve the pain. \  Follow-Up / Date of Return Visit to Your Physician:   Call for an appointment to arrange follow-up.  Patient Signature:  ________________________________________________________  Nurse's Signature:  ________________________________________________________    Foley Catheter Care, Adult A Foley catheter is a soft, flexible tube. This tube is placed into your bladder to drain pee (urine). If you go home with this catheter in place, follow the instructions below. TAKING CARE OF THE CATHETER  Wash your hands with soap and water.  Put soap and water on a clean washcloth.  Clean the skin where the tube goes into your body.  Clean away from the tube site.  Never wipe toward the tube.  Clean the area using a circular motion.  Remove all the soap. Pat the area dry with a clean towel. For males, reposition the skin that covers the end of the  penis (foreskin).  Attach the tube to your leg with tape or a leg strap. Do not stretch the tube tight. If you are using tape, remove any stickiness left behind by past tape you used.  Keep the drainage bag below your hips. Keep it off the floor.  Check your tube during the day. Make sure it is working and draining. Make sure the tube does not curl, twist, or bend.  Do not pull on the tube or try to take it out. TAKING CARE OF THE DRAINAGE BAGS You will have a large overnight drainage bag and a small leg bag. You may wear the overnight bag any time. Never wear the small bag at night. Follow the directions below. Emptying the Drainage Bag Empty your drainage bag when it is  - full or at least 2-3 times a day.  Wash your hands with soap and water.  Keep the drainage bag below your hips.  Hold the dirty bag over the toilet or clean container.  Open the pour spout at the bottom of the bag. Empty the pee into the toilet or container. Do not let the pour spout touch anything.  Clean the pour spout with a gauze pad or cotton ball that has rubbing alcohol on it.  Close the pour spout.  Attach the bag to your leg with tape or a leg strap.  Wash your hands well. Changing the Drainage Bag Change your bag once a month or sooner if it starts to smell  or look dirty.   Wash your hands with soap and water.  Pinch the rubber tube so that pee does not spill out.  Disconnect the catheter tube from the drainage tube at the connection valve. Do not let the tubes touch anything.  Clean the end of the catheter tube with an alcohol wipe. Clean the end of a the drainage tube with a different alcohol wipe.  Connect the catheter tube to the drainage tube of the clean drainage bag.  Attach the new bag to the leg with tape or a leg strap. Avoid attaching the new bag too tightly.  Wash your hands well. Cleaning the Drainage Bag  Wash your hands with soap and water.  Wash the bag in warm, soapy  water.  Rinse the bag with warm water.  Fill the bag with a mixture of white vinegar and water (1 cup vinegar to 1 quart warm water [.2 liter vinegar to 1 liter warm water]). Close the bag and soak it for 30 minutes in the solution.  Rinse the bag with warm water.  Hang the bag to dry with the pour spout open and hanging downward.  Store the clean bag (once it is dry) in a clean plastic bag.  Wash your hands well. PREVENT INFECTION  Wash your hands before and after touching your tube.  Take showers every day. Wash the skin where the tube enters your body. Do not take baths. Replace wet leg straps with dry ones, if this applies.  Do not use powders, sprays, or lotions on the genital area. Only use creams, lotions, or ointments as told by your doctor.  For females, wipe from front to back after going to the bathroom.  Drink enough fluids to keep your pee clear or pale yellow unless you are told not to have too much fluid (fluid restriction).  Do not let the drainage bag or tubing touch or lie on the floor.  Wear cotton underwear to keep the area dry. GET HELP IF:  Your pee is cloudy or smells unusually bad.  Your tube becomes clogged.  You are not draining pee into the bag or your bladder feels full.  Your tube starts to leak. GET HELP RIGHT AWAY IF:  You have pain, puffiness (swelling), redness, or yellowish-white fluid (pus) where the tube enters the body.  You have pain in the belly (abdomen), legs, lower back, or bladder.  You have a fever.  You see blood fill the tube, or your pee is pink or red.  You feel sick to your stomach (nauseous), throw up (vomit), or have chills.  Your tube gets pulled out. MAKE SURE YOU:   Understand these instructions.  Will watch your condition.  Will get help right away if you are not doing well or get worse.   This information is not intended to replace advice given to you by your health care provider. Make sure you discuss  any questions you have with your health care provider.   Document Released: 08/27/2012 Document Revised: 05/23/2014 Document Reviewed: 08/27/2012 Elsevier Interactive Patient Education 2016 [place] Instructions  Activity: Get plenty of rest for the remainder of the day. A responsible adult should stay with you for 24 hours following the procedure.  For the next 24 hours, DO NOT: -Drive a car -Paediatric nurse -Drink alcoholic beverages -Take any medication unless instructed by your physician -Make any legal decisions or sign important papers.  Meals: Start with liquid foods such  as gelatin or soup. Progress to regular foods as tolerated. Avoid greasy, spicy, heavy foods. If nausea and/or vomiting occur, drink only clear liquids until the nausea and/or vomiting subsides. Call your physician if vomiting continues.  Special Instructions/Symptoms: Your throat may feel dry or sore from the anesthesia or the breathing tube placed in your throat during surgery. If this causes discomfort, gargle with warm salt water. The discomfort should disappear within 24 hours.  If you had a scopolamine patch placed behind your ear for the management of post- operative nausea and/or vomiting:  1. The medication in the patch is effective for 72 hours, after which it should be removed.  Wrap patch in a tissue and discard in the trash. Wash hands thoroughly with soap and water. 2. You may remove the patch earlier than 72 hours if you experience unpleasant side effects which may include dry mouth, dizziness or visual disturbances. 3. Avoid touching the patch. Wash your hands with soap and water after contact with the patch.

## 2015-10-18 NOTE — Op Note (Signed)
.  Preoperative diagnosis: bladder tumor  Postoperative diagnosis: Same  Procedure: 1 cystoscopy 2. Transurethral resection of bladder tumor, large  Attending: Rosie Fate  Anesthesia: General  Estimated blood loss: Minimal  Drains: 22 French foley  Specimens: right lateral wall bladder tumors, biopsy of left lateral wall resection site  Antibiotics: rocephin  Findings: multiple papillary bladder tumors on the right lateral wall. Recurrence of bladder tumor at the lateral lateral wall resection site. Tumor size over 8 cm.  Indications: Patient is a 55 year old male with a history of T1G3 bladder cancer.  After discussing treatment options, they decided proceed with transurethral resection of a bladder tumor.  Procedure her in detail: The patient was brought to the operating room and a brief timeout was done to ensure correct patient, correct procedure, correct site.  General anesthesia was administered patient was placed in dorsal lithotomy position.  Their genitalia was then prepped and draped in usual sterile fashion.  A rigid 10 French cystoscope was passed in the urethra and the bladder.  Bladder was inspected and we noted numerous bladder tumors involving the right lateral wall and tumor recurrence at the left lateral wall resection site. the ureteral orifices were in the normal orthotopic locations. We then removed the cystoscope and placed a resectoscope into the bladder. Using the bipolar resectoscope we removed the bladder tumors down to the base. A subsequent muscle deep biopsy was then taken. Hemostasis was then obtained with electrocautery. We then removed the bladder tumor chips and sent them for pathology. We then re-inspected the bladder and found no residula bleeding.  the bladder was then drained, a 22 French foley was placed and this concluded the procedure which was well tolerated by patient.  Complications: None  Condition: Stable, extubated, transferred to  PACU  Plan: Patient is to be discharged home and followup in 5 days for foley catheter removal and pathology discussion.

## 2015-10-19 ENCOUNTER — Encounter (HOSPITAL_BASED_OUTPATIENT_CLINIC_OR_DEPARTMENT_OTHER): Payer: Self-pay | Admitting: Urology

## 2015-10-30 ENCOUNTER — Telehealth: Payer: Self-pay | Admitting: *Deleted

## 2015-10-30 NOTE — Telephone Encounter (Signed)
Left message for patient at [phone] (Home #) to check to see how they were doing from their ingrown toenail procedure that was performed on Wednesday, Oct 14, 2015. Waiting for a response.

## 2015-11-21 ENCOUNTER — Emergency Department (HOSPITAL_COMMUNITY)
Admission: EM | Admit: 2015-11-21 | Discharge: 2015-11-21 | Disposition: A | Discharge status: Home / Self Care | Payer: 59 | Attending: Emergency Medicine | Admitting: Emergency Medicine

## 2015-11-21 ENCOUNTER — Encounter (HOSPITAL_COMMUNITY): Payer: Self-pay | Admitting: Emergency Medicine

## 2015-11-21 ENCOUNTER — Emergency Department (HOSPITAL_BASED_OUTPATIENT_CLINIC_OR_DEPARTMENT_OTHER): Admit: 2015-11-21 | Discharge: 2015-11-21 | Disposition: A | Discharge status: Home / Self Care | Payer: 59

## 2015-11-21 DIAGNOSIS — L03115 Cellulitis of right lower limb: Secondary | ICD-10-CM | POA: Diagnosis not present

## 2015-11-21 DIAGNOSIS — Z79899 Other long term (current) drug therapy: Secondary | ICD-10-CM | POA: Diagnosis not present

## 2015-11-21 DIAGNOSIS — M79671 Pain in right foot: Secondary | ICD-10-CM | POA: Diagnosis present

## 2015-11-21 DIAGNOSIS — M79609 Pain in unspecified limb: Secondary | ICD-10-CM

## 2015-11-21 DIAGNOSIS — Z8551 Personal history of malignant neoplasm of bladder: Secondary | ICD-10-CM | POA: Diagnosis not present

## 2015-11-21 DIAGNOSIS — E785 Hyperlipidemia, unspecified: Secondary | ICD-10-CM | POA: Diagnosis not present

## 2015-11-21 DIAGNOSIS — F17210 Nicotine dependence, cigarettes, uncomplicated: Secondary | ICD-10-CM | POA: Insufficient documentation

## 2015-11-21 DIAGNOSIS — I10 Essential (primary) hypertension: Secondary | ICD-10-CM | POA: Insufficient documentation

## 2015-11-21 DIAGNOSIS — J45909 Unspecified asthma, uncomplicated: Secondary | ICD-10-CM | POA: Diagnosis not present

## 2015-11-21 DIAGNOSIS — Z7982 Long term (current) use of aspirin: Secondary | ICD-10-CM | POA: Diagnosis not present

## 2015-11-21 MED ORDER — CEPHALEXIN 500 MG PO CAPS: 500.0000 mg | ORAL_CAPSULE | Freq: Three times a day (TID) | ORAL | Status: AC

## 2015-11-21 MED ORDER — OXYCODONE-ACETAMINOPHEN 5-325 MG PO TABS: 1.0000 | ORAL_TABLET | Freq: Four times a day (QID) | ORAL | Status: DC | PRN

## 2015-11-21 MED ORDER — CEPHALEXIN 500 MG PO CAPS
1000.0000 mg | ORAL_CAPSULE | Freq: Once | ORAL | Status: AC
  Administered 2015-11-21: 1000 mg via ORAL
  Filled 2015-11-21: qty 2

## 2015-11-21 MED ORDER — OXYCODONE-ACETAMINOPHEN 5-325 MG PO TABS
1.0000 | ORAL_TABLET | Freq: Once | ORAL | Status: AC
  Administered 2015-11-21: 1 via ORAL
  Filled 2015-11-21: qty 1
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
## 2015-11-21 NOTE — ED Notes (Addendum)
Pt c/o right foot pain starting to move up leg, thinks he was bite by a spider. Foot is swollen and pain 10/10. Pt BP 154/109 and pt forgot to take HTN medication this am. Pt is currently being treated for bladder cancer.

## 2015-11-21 NOTE — ED Notes (Signed)
VAS US at bedside. 

## 2015-11-21 NOTE — ED Notes (Signed)
Bed: WA07 Expected date:  Expected time:  Means of arrival:  Comments: 

## 2015-11-21 NOTE — Discharge Instructions (Signed)
You have been diagnosed with skin infection on your right leg.  Take antibiotic as prescribed for the full duration.  Followup with your doctor in 2 days for recheck.  Return to the ER if you developed worsening pain, fever, persistent nausea/vomiting or if you have other concerns.   Cellulitis Cellulitis is an infection of the skin and the tissue beneath it. The infected area is usually red and tender. Cellulitis occurs most often in the arms and lower legs.  CAUSES  Cellulitis is caused by bacteria that enter the skin through cracks or cuts in the skin. The most common types of bacteria that cause cellulitis are staphylococci and streptococci. SIGNS AND SYMPTOMS   Redness and warmth.  Swelling.  Tenderness or pain.  Fever. DIAGNOSIS  Your health care provider can usually determine what is wrong based on a physical exam. Blood tests may also be done. TREATMENT  Treatment usually involves taking an antibiotic medicine. HOME CARE INSTRUCTIONS   Take your antibiotic medicine as directed by your health care provider. Finish the antibiotic even if you start to feel better.  Keep the infected arm or leg elevated to reduce swelling.  Apply a warm cloth to the affected area up to 4 times per day to relieve pain.  Take medicines only as directed by your health care provider.  Keep all follow-up visits as directed by your health care provider. SEEK MEDICAL CARE IF:   You notice red streaks coming from the infected area.  Your red area gets larger or turns dark in color.  Your bone or joint underneath the infected area becomes painful after the skin has healed.  Your infection returns in the same area or another area.  You notice a swollen bump in the infected area.  You develop new symptoms.  You have a fever. SEEK IMMEDIATE MEDICAL CARE IF:   You feel very sleepy.  You develop vomiting or diarrhea.  You have a general ill feeling (malaise) with muscle aches and pains.     This information is not intended to replace advice given to you by your health care provider. Make sure you discuss any questions you have with your health care provider.   Document Released: 02/09/2005 Document Revised: 01/21/2015 Document Reviewed: 07/18/2011 Elsevier Interactive Patient Education [employer].

## 2015-11-21 NOTE — Progress Notes (Signed)
*  Preliminary Results* Right lower extremity venous duplex completed. Right lower extremity is negative for deep vein thrombosis. There is no evidence of right Baker's cyst.  Incidental finding: there is a small heterogenous area of the right medial ankle measuring 1.6cm; etiology unknown.  Preliminary results discussed with Domenic Moras, PA-C.  11/21/2015 3:36 PM  Maudry Mayhew, RVT, RDCS, RDMS

## 2015-11-24 ENCOUNTER — Telehealth: Payer: Self-pay | Admitting: Internal Medicine

## 2015-11-24 ENCOUNTER — Ambulatory Visit (INDEPENDENT_AMBULATORY_CARE_PROVIDER_SITE_OTHER): Payer: 59 | Admitting: Internal Medicine

## 2015-11-24 ENCOUNTER — Other Ambulatory Visit (INDEPENDENT_AMBULATORY_CARE_PROVIDER_SITE_OTHER): Payer: 59

## 2015-11-24 ENCOUNTER — Encounter: Payer: Self-pay | Admitting: Internal Medicine

## 2015-11-24 VITALS — BP 140/80 | HR 73 | Temp 98.6°F

## 2015-11-24 DIAGNOSIS — M25571 Pain in right ankle and joints of right foot: Secondary | ICD-10-CM | POA: Diagnosis not present

## 2015-11-24 DIAGNOSIS — M79606 Pain in leg, unspecified: Secondary | ICD-10-CM | POA: Insufficient documentation

## 2015-11-24 DIAGNOSIS — I10 Essential (primary) hypertension: Secondary | ICD-10-CM

## 2015-11-24 DIAGNOSIS — C670 Malignant neoplasm of trigone of bladder: Secondary | ICD-10-CM

## 2015-11-24 DIAGNOSIS — F1010 Alcohol abuse, uncomplicated: Secondary | ICD-10-CM

## 2015-11-24 LAB — CBC WITH DIFFERENTIAL/PLATELET
BASOS ABS: 0.1 10*3/uL (ref 0.0–0.1)
Basophils Relative: 1 % (ref 0.0–3.0)
EOS PCT: 2.8 % (ref 0.0–5.0)
Eosinophils Absolute: 0.2 10*3/uL (ref 0.0–0.7)
HCT: 39.3 % (ref 39.0–52.0)
HEMOGLOBIN: 13.3 g/dL (ref 13.0–17.0)
Lymphocytes Relative: 18.7 % (ref 12.0–46.0)
Lymphs Abs: 1.5 10*3/uL (ref 0.7–4.0)
MCHC: 33.8 g/dL (ref 30.0–36.0)
MCV: 96 fl (ref 78.0–100.0)
MONOS PCT: 7.8 % (ref 3.0–12.0)
Monocytes Absolute: 0.6 10*3/uL (ref 0.1–1.0)
Neutro Abs: 5.6 10*3/uL (ref 1.4–7.7)
Neutrophils Relative %: 69.7 % (ref 43.0–77.0)
Platelets: 252 10*3/uL (ref 150.0–400.0)
RBC: 4.1 Mil/uL — AB (ref 4.22–5.81)
RDW: 13.8 % (ref 11.5–15.5)
WBC: 8 10*3/uL (ref 4.0–10.5)

## 2015-11-24 LAB — BASIC METABOLIC PANEL
BUN: 18 mg/dL (ref 6–23)
CALCIUM: 10.1 mg/dL (ref 8.4–10.5)
CHLORIDE: 102 meq/L (ref 96–112)
CO2: 27 meq/L (ref 19–32)
CREATININE: 0.83 mg/dL (ref 0.40–1.50)
GFR: 102.99 mL/min (ref >60.00)
GLUCOSE: 97 mg/dL (ref 70–99)
Potassium: 4.1 mEq/L (ref 3.5–5.1)
SODIUM: 138 meq/L (ref 135–145)

## 2015-11-24 LAB — URIC ACID: Uric Acid, Serum: 8 mg/dL — ABNORMAL HIGH (ref 4.0–7.8)

## 2015-11-24 MED ORDER — COLCHICINE 0.6 MG PO TABS: 0.6000 mg | ORAL_TABLET | Freq: Every day | ORAL | Status: DC

## 2015-11-24 MED ORDER — KETOROLAC TROMETHAMINE 60 MG/2ML IM SOLN
60.0000 mg | Freq: Once | INTRAMUSCULAR | Status: AC
  Administered 2015-11-24: 60 mg via INTRAMUSCULAR

## 2015-11-24 MED ORDER — HYDROCODONE-ACETAMINOPHEN 7.5-325 MG PO TABS: 1.0000 | ORAL_TABLET | Freq: Four times a day (QID) | ORAL | Status: DC | PRN

## 2015-11-24 MED ORDER — INDOMETHACIN 50 MG PO CAPS: 50.0000 mg | ORAL_CAPSULE | Freq: Three times a day (TID) | ORAL | Status: DC | PRN

## 2015-11-24 NOTE — Assessment & Plan Note (Signed)
Started chemo

## 2015-11-24 NOTE — Telephone Encounter (Signed)
Patients wife called to advise that he was seen at [facility] ED on 11/21/2015, and given antibiotics for cellulitis. She states that he was told if no improvement, then he should see PCP immediatley. Reports discoloration in the right foot from the ankle down. She is highly concerned and pleads that we see him today. Note that patient is getting chemo weekly. There is no availability in our clinic. Should I double book him? Please advise?

## 2015-11-24 NOTE — Progress Notes (Signed)
Subjective:  Patient ID: Christian [name], male    DOB: Feb 08, 1961  Age: 55 y.o. MRN: [id number]  CC: No chief complaint on file.   HPI Christian [name] presents for a sudden onset L foot pain that woke him up at night a week ago. He went to ER on Sat - given an abx for cellulitis. [place] was (-). It is worse - 10/10 w/o pain med. Ibuprofen helped a lot. He got his first chemo for the bladder cancer  Outpatient Prescriptions Prior to Visit  Medication Sig Dispense Refill  . albuterol (PROVENTIL HFA;VENTOLIN HFA) 108 (90 BASE) MCG/ACT inhaler Inhale 2 puffs into the lungs every 6 (six) hours as needed for wheezing. For shortness of breath. 1 Inhaler 3  . amLODipine (NORVASC) 5 MG tablet Take 1 tablet (5 mg total) by mouth daily. 90 tablet 3  . aspirin EC 325 MG tablet Take 325 mg by mouth daily.    . cephALEXin (KEFLEX) 500 MG capsule Take 1 capsule (500 mg total) by mouth 3 (three) times daily. 40 capsule 0  . clonazePAM (KLONOPIN) 1 MG tablet Take 1 tablet (1 mg total) by mouth 2 (two) times daily as needed for anxiety. 60 tablet 5  . lisinopril (PRINIVIL,ZESTRIL) 20 MG tablet Take 1 tablet (20 mg total) by mouth 2 (two) times daily. 180 tablet 3  . Lorcaserin HCl (BELVIQ) 10 MG TABS Use bid 60 tablet 2  . oxybutynin (DITROPAN) 5 MG tablet Take 1 tablet (5 mg total) by mouth 3 (three) times daily. 30 tablet 0  . oxyCODONE-acetaminophen (ROXICET) 5-325 MG tablet Take 1 tablet by mouth every 6 (six) hours as needed for severe pain. 16 tablet 0  . ranitidine (ZANTAC) 150 MG tablet Take 150 mg by mouth daily as needed for heartburn.     . sildenafil (VIAGRA) 100 MG tablet TAKE 1 TABLET BY MOUTH AS NEEDED 12 tablet 8  . traMADol (ULTRAM) 50 MG tablet Take 1-2 tablets (50-100 mg total) by mouth 2 (two) times daily as needed. 60 tablet 2   No facility-administered medications prior to visit.    ROS Review of Systems  Constitutional: Negative for appetite change, fatigue and unexpected weight  change.  HENT: Negative for congestion, nosebleeds, sneezing, sore throat and trouble swallowing.   Eyes: Negative for itching and visual disturbance.  Respiratory: Negative for cough.   Cardiovascular: Negative for chest pain, palpitations and leg swelling.  Gastrointestinal: Negative for nausea, diarrhea, blood in stool and abdominal distention.  Genitourinary: Negative for frequency and hematuria.  Musculoskeletal: Positive for joint swelling and gait problem. Negative for back pain and neck pain.  Skin: Negative for rash.  Neurological: Negative for dizziness, tremors, speech difficulty and weakness.  Psychiatric/Behavioral: Negative for suicidal ideas, sleep disturbance, dysphoric mood and agitation. The patient is not nervous/anxious.     Objective:  BP 140/80 mmHg  Pulse 73  Temp(Src) 98.6 F (37 C) (Oral)  SpO2 96%  BP Readings from Last 3 Encounters:  11/24/15 140/80  11/21/15 150/85  10/16/15 126/71    Wt Readings from Last 3 Encounters:  10/16/15 243 lb (110.224 kg)  09/04/15 246 lb 8 oz (111.812 kg)  09/01/15 249 lb (112.946 kg)    Physical Exam  Constitutional: He is oriented to person, place, and time. He appears well-developed. No distress.  NAD  HENT:  Mouth/Throat: Oropharynx is clear and moist.  Eyes: Conjunctivae are normal. Pupils are equal, round, and reactive to light.  Neck: Normal range of  motion. No JVD present. No thyromegaly present.  Cardiovascular: Normal rate, regular rhythm, normal heart sounds and intact distal pulses.  Exam reveals no gallop and no friction rub.   No murmur heard. Pulmonary/Chest: Effort normal and breath sounds normal. No respiratory distress. He has no wheezes. He has no rales. He exhibits no tenderness.  Abdominal: Soft. Bowel sounds are normal. He exhibits no distension and no mass. There is no tenderness. There is no rebound and no guarding.  Musculoskeletal: Normal range of motion. He exhibits edema and tenderness.    Lymphadenopathy:    He has no cervical adenopathy.  Neurological: He is alert and oriented to person, place, and time. He has normal reflexes. No cranial nerve deficit. He exhibits normal muscle tone. He displays a negative Romberg sign. Coordination and gait normal.  Skin: Skin is warm and dry. No rash noted.  Psychiatric: He has a normal mood and affect. His behavior is normal. Judgment and thought content normal.  R foot w/edema, discoloration and a very tender mid-foot  Lab Results  Component Value Date   WBC 7.0 06/03/2015   HGB 13.6 10/16/2015   HCT 40.0 10/16/2015   PLT 170.0 06/03/2015   GLUCOSE 108* 10/16/2015   CHOL 234* 06/03/2015   TRIG 190.0* 06/03/2015   HDL 39.30 06/03/2015   LDLDIRECT 129.0 11/20/2014   LDLCALC 156* 06/03/2015   ALT 41 06/03/2015   AST 35 06/03/2015   NA 139 10/16/2015   K 4.5 10/16/2015   CL 104 09/04/2015   CREATININE 0.90 09/04/2015   BUN 16 09/04/2015   CO2 29 06/03/2015   TSH 1.65 06/03/2015   PSA 0.75 11/20/2014   INR 1.01 03/28/2012   HGBA1C 5.5 05/13/2014    No results found.  Assessment & Plan:   There are no diagnoses linked to this encounter. I am having Mr. Almada maintain his sildenafil, albuterol, clonazePAM, lisinopril, Lorcaserin HCl, traMADol, aspirin EC, ranitidine, amLODipine, oxybutynin, cephALEXin, and oxyCODONE-acetaminophen.  No orders of the defined types were placed in this encounter.     Follow-up: No Follow-up on file.  Walker Kehr, MD

## 2015-11-24 NOTE — Progress Notes (Signed)
Pre visit review using our clinic review tool, if applicable. No additional management support is needed unless otherwise documented below in the visit note. 

## 2015-11-24 NOTE — Telephone Encounter (Signed)
Ok to work in today with PCP @ 11:00 am.  OV scheduled per PCP. Closing phone note.

## 2015-11-24 NOTE — Patient Instructions (Signed)

## 2015-11-24 NOTE — Assessment & Plan Note (Signed)
Lisinopril and Norvasc

## 2015-11-24 NOTE — Assessment & Plan Note (Signed)
Labs Toradol IM Colchicine, Indocin Finish abx just in case

## 2015-11-24 NOTE — Assessment & Plan Note (Signed)
No ETOH 7/17

## 2015-12-10 ENCOUNTER — Ambulatory Visit: Payer: 59 | Admitting: Internal Medicine

## 2015-12-25 ENCOUNTER — Other Ambulatory Visit (INDEPENDENT_AMBULATORY_CARE_PROVIDER_SITE_OTHER): Payer: 59

## 2015-12-25 ENCOUNTER — Ambulatory Visit (INDEPENDENT_AMBULATORY_CARE_PROVIDER_SITE_OTHER): Payer: 59 | Admitting: Internal Medicine

## 2015-12-25 ENCOUNTER — Encounter: Payer: Self-pay | Admitting: Internal Medicine

## 2015-12-25 DIAGNOSIS — I10 Essential (primary) hypertension: Secondary | ICD-10-CM | POA: Diagnosis not present

## 2015-12-25 DIAGNOSIS — I517 Cardiomegaly: Secondary | ICD-10-CM | POA: Diagnosis not present

## 2015-12-25 DIAGNOSIS — C670 Malignant neoplasm of trigone of bladder: Secondary | ICD-10-CM

## 2015-12-25 DIAGNOSIS — E669 Obesity, unspecified: Secondary | ICD-10-CM

## 2015-12-25 DIAGNOSIS — M2550 Pain in unspecified joint: Secondary | ICD-10-CM

## 2015-12-25 DIAGNOSIS — F410 Panic disorder [episodic paroxysmal anxiety] without agoraphobia: Secondary | ICD-10-CM

## 2015-12-25 LAB — BASIC METABOLIC PANEL
BUN: 15 mg/dL (ref 6–23)
CO2: 28 meq/L (ref 19–32)
Calcium: 9.9 mg/dL (ref 8.4–10.5)
Chloride: 105 mEq/L (ref 96–112)
Creatinine, Ser: 0.93 mg/dL (ref 0.40–1.50)
GFR: 90.29 mL/min (ref >60.00)
GLUCOSE: 100 mg/dL — AB (ref 70–99)
POTASSIUM: 3.9 meq/L (ref 3.5–5.1)
Sodium: 141 mEq/L (ref 135–145)

## 2015-12-25 LAB — TSH: TSH: 1.1 u[IU]/mL (ref 0.35–4.50)

## 2015-12-25 MED ORDER — CLONAZEPAM 1 MG PO TABS: 1.0000 mg | ORAL_TABLET | Freq: Two times a day (BID) | ORAL | 5 refills | Status: DC | PRN

## 2015-12-25 MED ORDER — HYDROCODONE-ACETAMINOPHEN 7.5-325 MG PO TABS: 1.0000 | ORAL_TABLET | Freq: Four times a day (QID) | ORAL | 0 refills | Status: DC | PRN

## 2015-12-25 NOTE — Progress Notes (Signed)
Pre visit review using our clinic review tool, if applicable. No additional management support is needed unless otherwise documented below in the visit note. 

## 2015-12-25 NOTE — Assessment & Plan Note (Signed)
BCG chemo x6 --- finishing

## 2015-12-25 NOTE — Assessment & Plan Note (Signed)
On Lisinopril, Norvasc

## 2015-12-25 NOTE — Assessment & Plan Note (Signed)
Wt Readings from Last 3 Encounters:  12/25/15 249 lb (112.9 kg)  10/16/15 243 lb (110.2 kg)  09/04/15 246 lb 8 oz (111.8 kg)  Diet discussed

## 2015-12-25 NOTE — Assessment & Plan Note (Signed)
Norco prn - rare  Potential benefits of a long term opioids use as well as potential risks (i.e. addiction risk, apnea etc) and complications (i.e. Somnolence, constipation and others) were explained to the patient and were aknowledged. 

## 2015-12-25 NOTE — Progress Notes (Signed)
Subjective:  Patient ID: Christian [name], male    DOB: 07/15/1960  Age: 55 y.o. MRN: [id number]  CC: No chief complaint on file.   HPI Christian [name] presents for bladder ca - on BCG, HTN, anxiety  Outpatient Medications Prior to Visit  Medication Sig Dispense Refill  . albuterol (PROVENTIL HFA;VENTOLIN HFA) 108 (90 BASE) MCG/ACT inhaler Inhale 2 puffs into the lungs every 6 (six) hours as needed for wheezing. For shortness of breath. 1 Inhaler 3  . amLODipine (NORVASC) 5 MG tablet Take 1 tablet (5 mg total) by mouth daily. 90 tablet 3  . aspirin EC 325 MG tablet Take 325 mg by mouth daily.    . clonazePAM (KLONOPIN) 1 MG tablet Take 1 tablet (1 mg total) by mouth 2 (two) times daily as needed for anxiety. 60 tablet 5  . colchicine 0.6 MG tablet Take 1 tablet (0.6 mg total) by mouth daily. 20 tablet 2  . HYDROcodone-acetaminophen (NORCO) 7.5-325 MG tablet Take 1 tablet by mouth every 6 (six) hours as needed for moderate pain or severe pain. 30 tablet 0  . indomethacin (INDOCIN) 50 MG capsule Take 1 capsule (50 mg total) by mouth 3 (three) times daily as needed for moderate pain. 30 capsule 3  . lisinopril (PRINIVIL,ZESTRIL) 20 MG tablet Take 1 tablet (20 mg total) by mouth 2 (two) times daily. 180 tablet 3  . ranitidine (ZANTAC) 150 MG tablet Take 150 mg by mouth daily as needed for heartburn.     . sildenafil (VIAGRA) 100 MG tablet TAKE 1 TABLET BY MOUTH AS NEEDED 12 tablet 8  . Lorcaserin HCl (BELVIQ) 10 MG TABS Use bid (Patient not taking: Reported on 12/25/2015) 60 tablet 2  . oxybutynin (DITROPAN) 5 MG tablet Take 1 tablet (5 mg total) by mouth 3 (three) times daily. (Patient not taking: Reported on 12/25/2015) 30 tablet 0   No facility-administered medications prior to visit.     ROS Review of Systems  Constitutional: Positive for fatigue and unexpected weight change. Negative for appetite change.  HENT: Negative for congestion, nosebleeds, sneezing, sore throat and trouble  swallowing.   Eyes: Negative for itching and visual disturbance.  Respiratory: Negative for cough.   Cardiovascular: Negative for chest pain, palpitations and leg swelling.  Gastrointestinal: Negative for abdominal distention, blood in stool, diarrhea and nausea.  Genitourinary: Negative for frequency and hematuria.  Musculoskeletal: Negative for back pain, gait problem, joint swelling and neck pain.  Skin: Negative for rash.  Neurological: Negative for dizziness, tremors, speech difficulty and weakness.  Psychiatric/Behavioral: Negative for agitation, dysphoric mood and sleep disturbance. The patient is nervous/anxious.     Objective:  BP (!) 150/82   Pulse 78   Wt 249 lb (112.9 kg)   SpO2 98%   BMI 37.86 kg/m   BP Readings from Last 3 Encounters:  12/25/15 (!) 150/82  11/24/15 140/80  11/21/15 150/85    Wt Readings from Last 3 Encounters:  12/25/15 249 lb (112.9 kg)  10/16/15 243 lb (110.2 kg)  09/04/15 246 lb 8 oz (111.8 kg)    Physical Exam  Constitutional: He is oriented to person, place, and time. He appears well-developed. No distress.  NAD  HENT:  Mouth/Throat: Oropharynx is clear and moist.  Eyes: Conjunctivae are normal. Pupils are equal, round, and reactive to light.  Neck: Normal range of motion. No JVD present. No thyromegaly present.  Cardiovascular: Normal rate, regular rhythm, normal heart sounds and intact distal pulses.  Exam reveals no  gallop and no friction rub.   No murmur heard. Pulmonary/Chest: Effort normal and breath sounds normal. No respiratory distress. He has no wheezes. He has no rales. He exhibits no tenderness.  Abdominal: Soft. Bowel sounds are normal. He exhibits no distension and no mass. There is no tenderness. There is no rebound and no guarding.  Musculoskeletal: Normal range of motion. He exhibits no edema or tenderness.  Lymphadenopathy:    He has no cervical adenopathy.  Neurological: He is alert and oriented to person, place, and  time. He has normal reflexes. No cranial nerve deficit. He exhibits normal muscle tone. He displays a negative Romberg sign. Coordination and gait normal.  Skin: Skin is warm and dry. No rash noted.  Psychiatric: He has a normal mood and affect. His behavior is normal. Judgment and thought content normal.  Obese  Lab Results  Component Value Date   WBC 8.0 11/24/2015   HGB 13.3 11/24/2015   HCT 39.3 11/24/2015   PLT 252.0 11/24/2015   GLUCOSE 97 11/24/2015   CHOL 234 (H) 06/03/2015   TRIG 190.0 (H) 06/03/2015   HDL 39.30 06/03/2015   LDLDIRECT 129.0 11/20/2014   LDLCALC 156 (H) 06/03/2015   ALT 41 06/03/2015   AST 35 06/03/2015   NA 138 11/24/2015   K 4.1 11/24/2015   CL 102 11/24/2015   CREATININE 0.83 11/24/2015   BUN 18 11/24/2015   CO2 27 11/24/2015   TSH 1.65 06/03/2015   PSA 0.75 11/20/2014   INR 1.01 03/28/2012   HGBA1C 5.5 05/13/2014    No results found.  Assessment & Plan:   There are no diagnoses linked to this encounter. I am having Christian [name] maintain his sildenafil, albuterol, clonazePAM, lisinopril, Lorcaserin HCl, aspirin EC, ranitidine, amLODipine, oxybutynin, colchicine, indomethacin, and HYDROcodone-acetaminophen.  No orders of the defined types were placed in this encounter.    Follow-up: No Follow-up on file.  Walker Kehr, MD

## 2015-12-25 NOTE — Assessment & Plan Note (Signed)
Klonopin - occasional use prn - not w/alcohol  Potential benefits of a long term benzodiazepines  use as well as potential risks  and complications were explained to the patient and were aknowledged.  

## 2015-12-25 NOTE — Patient Instructions (Signed)
Wt Readings from Last 3 Encounters:  12/25/15 249 lb (112.9 kg)  10/16/15 243 lb (110.2 kg)  09/04/15 246 lb 8 oz (111.8 kg)    Goal BP<130/85

## 2016-01-01 ENCOUNTER — Telehealth: Payer: Self-pay | Admitting: Internal Medicine

## 2016-01-01 NOTE — Telephone Encounter (Signed)
Patient called in and I notified him of his lab results.

## 2016-01-26 ENCOUNTER — Other Ambulatory Visit: Payer: Self-pay | Admitting: Urology

## 2016-02-17 ENCOUNTER — Encounter (HOSPITAL_BASED_OUTPATIENT_CLINIC_OR_DEPARTMENT_OTHER): Payer: Self-pay | Admitting: *Deleted

## 2016-02-17 NOTE — Progress Notes (Signed)
Pt states no change in history since last visit in 10/2015.  Pt instructed npo pmn x norvasc,zantac w a sip of water.  Pt to use inhaler and bring it with him the DOS.  To [facility] 10/9 @ 1115.  Needs istat on arrival.  ekg w chart.

## 2016-02-22 ENCOUNTER — Ambulatory Visit (HOSPITAL_BASED_OUTPATIENT_CLINIC_OR_DEPARTMENT_OTHER): Payer: 59 | Admitting: Anesthesiology

## 2016-02-22 ENCOUNTER — Ambulatory Visit (HOSPITAL_BASED_OUTPATIENT_CLINIC_OR_DEPARTMENT_OTHER)
Admission: RE | Admit: 2016-02-22 | Discharge: 2016-02-22 | Disposition: A | Discharge status: Home / Self Care | Payer: 59 | Source: Ambulatory Visit | Attending: Urology | Admitting: Urology

## 2016-02-22 ENCOUNTER — Encounter (HOSPITAL_BASED_OUTPATIENT_CLINIC_OR_DEPARTMENT_OTHER): Payer: Self-pay | Admitting: *Deleted

## 2016-02-22 ENCOUNTER — Encounter (HOSPITAL_BASED_OUTPATIENT_CLINIC_OR_DEPARTMENT_OTHER)
Admission: RE | Disposition: A | Discharge status: Home / Self Care | Payer: Self-pay | Source: Ambulatory Visit | Attending: Urology

## 2016-02-22 DIAGNOSIS — J45909 Unspecified asthma, uncomplicated: Secondary | ICD-10-CM | POA: Insufficient documentation

## 2016-02-22 DIAGNOSIS — F17290 Nicotine dependence, other tobacco product, uncomplicated: Secondary | ICD-10-CM | POA: Insufficient documentation

## 2016-02-22 DIAGNOSIS — F419 Anxiety disorder, unspecified: Secondary | ICD-10-CM | POA: Insufficient documentation

## 2016-02-22 DIAGNOSIS — E669 Obesity, unspecified: Secondary | ICD-10-CM | POA: Diagnosis not present

## 2016-02-22 DIAGNOSIS — M1990 Unspecified osteoarthritis, unspecified site: Secondary | ICD-10-CM | POA: Insufficient documentation

## 2016-02-22 DIAGNOSIS — K219 Gastro-esophageal reflux disease without esophagitis: Secondary | ICD-10-CM | POA: Diagnosis not present

## 2016-02-22 DIAGNOSIS — Z79899 Other long term (current) drug therapy: Secondary | ICD-10-CM | POA: Insufficient documentation

## 2016-02-22 DIAGNOSIS — I444 Left anterior fascicular block: Secondary | ICD-10-CM | POA: Diagnosis not present

## 2016-02-22 DIAGNOSIS — C672 Malignant neoplasm of lateral wall of bladder: Secondary | ICD-10-CM | POA: Diagnosis not present

## 2016-02-22 DIAGNOSIS — N3090 Cystitis, unspecified without hematuria: Secondary | ICD-10-CM | POA: Insufficient documentation

## 2016-02-22 DIAGNOSIS — C679 Malignant neoplasm of bladder, unspecified: Secondary | ICD-10-CM | POA: Diagnosis present

## 2016-02-22 DIAGNOSIS — I10 Essential (primary) hypertension: Secondary | ICD-10-CM | POA: Insufficient documentation

## 2016-02-22 DIAGNOSIS — Z7982 Long term (current) use of aspirin: Secondary | ICD-10-CM | POA: Diagnosis not present

## 2016-02-22 DIAGNOSIS — I4510 Unspecified right bundle-branch block: Secondary | ICD-10-CM | POA: Insufficient documentation

## 2016-02-22 DIAGNOSIS — Z6837 Body mass index (BMI) 37.0-37.9, adult: Secondary | ICD-10-CM | POA: Diagnosis not present

## 2016-02-22 DIAGNOSIS — N529 Male erectile dysfunction, unspecified: Secondary | ICD-10-CM | POA: Insufficient documentation

## 2016-02-22 HISTORY — PX: TRANSURETHRAL RESECTION OF BLADDER TUMOR: SHX2575

## 2016-02-22 HISTORY — DX: Unspecified osteoarthritis, unspecified site: M19.90

## 2016-02-22 LAB — POCT I-STAT, CHEM 8
BUN: 14 mg/dL (ref 6–20)
CALCIUM ION: 1.28 mmol/L (ref 1.15–1.40)
CHLORIDE: 104 mmol/L (ref 101–111)
Creatinine, Ser: 0.8 mg/dL (ref 0.61–1.24)
GLUCOSE: 96 mg/dL (ref 65–99)
HCT: 41 % (ref 39.0–52.0)
HEMOGLOBIN: 13.9 g/dL (ref 13.0–17.0)
Potassium: 4.1 mmol/L (ref 3.5–5.1)
SODIUM: 141 mmol/L (ref 135–145)
TCO2: 26 mmol/L (ref 0–100)

## 2016-02-22 SURGERY — TURBT (TRANSURETHRAL RESECTION OF BLADDER TUMOR)
Anesthesia: General | Site: Bladder

## 2016-02-22 MED ORDER — KETOROLAC TROMETHAMINE 30 MG/ML IJ SOLN
30.0000 mg | Freq: Once | INTRAMUSCULAR | Status: AC | PRN
  Administered 2016-02-22: 30 mg via INTRAVENOUS
  Filled 2016-02-22: qty 1

## 2016-02-22 MED ORDER — HYDROMORPHONE HCL 1 MG/ML IJ SOLN
INTRAMUSCULAR | Status: AC
  Filled 2016-02-22: qty 1

## 2016-02-22 MED ORDER — LIDOCAINE 2% (20 MG/ML) 5 ML SYRINGE
INTRAMUSCULAR | Status: AC
  Filled 2016-02-22: qty 5

## 2016-02-22 MED ORDER — KETOROLAC TROMETHAMINE 30 MG/ML IJ SOLN
INTRAMUSCULAR | Status: AC
Start: 2016-02-22 — End: 2016-02-22
  Filled 2016-02-22: qty 1

## 2016-02-22 MED ORDER — OXYBUTYNIN CHLORIDE 5 MG PO TABS: 5.0000 mg | ORAL_TABLET | Freq: Three times a day (TID) | ORAL | 0 refills | Status: DC

## 2016-02-22 MED ORDER — CEFAZOLIN SODIUM-DEXTROSE 2-4 GM/100ML-% IV SOLN
INTRAVENOUS | Status: AC
  Filled 2016-02-22: qty 100

## 2016-02-22 MED ORDER — PROPOFOL 10 MG/ML IV BOLUS
INTRAVENOUS | Status: DC | PRN
  Administered 2016-02-22: 200 mg via INTRAVENOUS
  Administered 2016-02-22: 50 mg via INTRAVENOUS

## 2016-02-22 MED ORDER — IOHEXOL 300 MG/ML  SOLN
INTRAMUSCULAR | Status: DC | PRN
  Administered 2016-02-22: 6 mL

## 2016-02-22 MED ORDER — CEFAZOLIN IN D5W 1 GM/50ML IV SOLN
1.0000 g | INTRAVENOUS | Status: DC
  Filled 2016-02-22: qty 50

## 2016-02-22 MED ORDER — LACTATED RINGERS IV SOLN
INTRAVENOUS | Status: DC
  Administered 2016-02-22 (×2): via INTRAVENOUS
  Filled 2016-02-22: qty 1000

## 2016-02-22 MED ORDER — PROPOFOL 10 MG/ML IV BOLUS
INTRAVENOUS | Status: AC
  Filled 2016-02-22: qty 40

## 2016-02-22 MED ORDER — PROMETHAZINE HCL 25 MG/ML IJ SOLN
6.2500 mg | INTRAMUSCULAR | Status: DC | PRN
  Filled 2016-02-22: qty 1

## 2016-02-22 MED ORDER — SODIUM CHLORIDE 0.9 % IR SOLN
Status: DC | PRN
  Administered 2016-02-22 (×2): 3000 mL via INTRAVESICAL

## 2016-02-22 MED ORDER — DEXAMETHASONE SODIUM PHOSPHATE 10 MG/ML IJ SOLN
INTRAMUSCULAR | Status: AC
  Filled 2016-02-22: qty 1

## 2016-02-22 MED ORDER — FENTANYL CITRATE (PF) 100 MCG/2ML IJ SOLN
INTRAMUSCULAR | Status: DC | PRN
  Administered 2016-02-22 (×2): 50 ug via INTRAVENOUS

## 2016-02-22 MED ORDER — OXYBUTYNIN CHLORIDE 5 MG PO TABS
5.0000 mg | ORAL_TABLET | Freq: Once | ORAL | Status: DC
  Filled 2016-02-22: qty 1

## 2016-02-22 MED ORDER — MIDAZOLAM HCL 2 MG/2ML IJ SOLN
INTRAMUSCULAR | Status: AC
  Filled 2016-02-22: qty 2

## 2016-02-22 MED ORDER — HYDROMORPHONE HCL 1 MG/ML IJ SOLN
0.2500 mg | INTRAMUSCULAR | Status: DC | PRN
  Administered 2016-02-22 (×2): 0.5 mg via INTRAVENOUS
  Filled 2016-02-22: qty 1

## 2016-02-22 MED ORDER — ONDANSETRON HCL 4 MG/2ML IJ SOLN
INTRAMUSCULAR | Status: DC | PRN
  Administered 2016-02-22: 4 mg via INTRAVENOUS

## 2016-02-22 MED ORDER — MIDAZOLAM HCL 5 MG/5ML IJ SOLN
INTRAMUSCULAR | Status: DC | PRN
  Administered 2016-02-22: 2 mg via INTRAVENOUS

## 2016-02-22 MED ORDER — STERILE WATER FOR IRRIGATION IR SOLN
Status: DC | PRN
  Administered 2016-02-22: 500 mL

## 2016-02-22 MED ORDER — CEFAZOLIN SODIUM-DEXTROSE 2-4 GM/100ML-% IV SOLN
2.0000 g | INTRAVENOUS | Status: AC
  Administered 2016-02-22: 2 g via INTRAVENOUS
  Filled 2016-02-22: qty 100

## 2016-02-22 MED ORDER — DEXAMETHASONE SODIUM PHOSPHATE 4 MG/ML IJ SOLN
INTRAMUSCULAR | Status: DC | PRN
  Administered 2016-02-22: 10 mg via INTRAVENOUS

## 2016-02-22 MED ORDER — OXYCODONE-ACETAMINOPHEN 5-325 MG PO TABS: 1.0000 | ORAL_TABLET | ORAL | 0 refills | Status: DC | PRN

## 2016-02-22 MED ORDER — ONDANSETRON HCL 4 MG/2ML IJ SOLN
INTRAMUSCULAR | Status: AC
  Filled 2016-02-22: qty 2

## 2016-02-22 MED ORDER — FENTANYL CITRATE (PF) 100 MCG/2ML IJ SOLN
INTRAMUSCULAR | Status: AC
  Filled 2016-02-22: qty 2

## 2016-02-22 MED ORDER — LIDOCAINE HCL (CARDIAC) 20 MG/ML IV SOLN
INTRAVENOUS | Status: DC | PRN
  Administered 2016-02-22: 100 mg via INTRAVENOUS

## 2016-02-22 SURGICAL SUPPLY — 29 items
BAG DRAIN URO-CYSTO SKYTR STRL (DRAIN) ×3 IMPLANT
BAG DRN ANRFLXCHMBR STRAP LEK (BAG)
BAG DRN UROCATH (DRAIN) ×1
BAG URINE LEG 19OZ MD ST LTX (BAG) IMPLANT
BAG URINE LEG 500ML (DRAIN) ×2 IMPLANT
CATH FOLEY 2WAY SLVR  5CC 22FR (CATHETERS)
CATH FOLEY 2WAY SLVR 30CC 20FR (CATHETERS) IMPLANT
CATH FOLEY 2WAY SLVR 30CC 22FR (CATHETERS) IMPLANT
CATH FOLEY 2WAY SLVR 5CC 22FR (CATHETERS) IMPLANT
CATH FOLEY 3WAY 30CC 22F (CATHETERS) ×2 IMPLANT
CLOTH BEACON ORANGE TIMEOUT ST (SAFETY) ×3 IMPLANT
ELECT REM PT RETURN 9FT ADLT (ELECTROSURGICAL)
ELECTRODE REM PT RTRN 9FT ADLT (ELECTROSURGICAL) IMPLANT
GLOVE BIO SURGEON STRL SZ 6.5 (GLOVE) ×1 IMPLANT
GLOVE BIO SURGEON STRL SZ8 (GLOVE) ×3 IMPLANT
GLOVE BIO SURGEONS STRL SZ 6.5 (GLOVE) ×1
GLOVE INDICATOR 6.5 STRL GRN (GLOVE) ×3 IMPLANT
GOWN STRL REUS W/ TWL LRG LVL3 (GOWN DISPOSABLE) ×1 IMPLANT
GOWN STRL REUS W/TWL LRG LVL3 (GOWN DISPOSABLE) ×3
HOLDER FOLEY CATH W/STRAP (MISCELLANEOUS) ×2 IMPLANT
IV NS IRRIG 3000ML ARTHROMATIC (IV SOLUTION) ×6 IMPLANT
KIT ROOM TURNOVER WOR (KITS) ×3 IMPLANT
MANIFOLD NEPTUNE II (INSTRUMENTS) ×2 IMPLANT
PACK CYSTO (CUSTOM PROCEDURE TRAY) ×3 IMPLANT
PLUG CATH AND CAP STER (CATHETERS) ×2 IMPLANT
SYR 20CC LL (SYRINGE) ×3 IMPLANT
SYRINGE IRR TOOMEY STRL 70CC (SYRINGE) ×2 IMPLANT
TUBE CONNECTING 12'X1/4 (SUCTIONS)
TUBE CONNECTING 12X1/4 (SUCTIONS) IMPLANT
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
## 2016-02-22 NOTE — Anesthesia Preprocedure Evaluation (Signed)
Anesthesia Evaluation  Patient identified by MRN, date of birth, ID band Patient awake    Reviewed: Allergy & Precautions, NPO status , Patient's Chart, lab work & pertinent test results  Airway Mallampati: III  TM Distance: <3 FB Neck ROM: Full    Dental no notable dental hx.    Pulmonary asthma , Current Smoker,    Pulmonary exam normal breath sounds clear to auscultation       Cardiovascular hypertension, Normal cardiovascular exam Rhythm:Regular Rate:Normal     Neuro/Psych negative neurological ROS  negative psych ROS   GI/Hepatic negative GI ROS, Neg liver ROS,   Endo/Other  obesity  Renal/GU negative Renal ROS  negative genitourinary   Musculoskeletal negative musculoskeletal ROS (+)   Abdominal   Peds negative pediatric ROS (+)  Hematology negative hematology ROS (+)   Anesthesia Other Findings   Reproductive/Obstetrics negative OB ROS                             Anesthesia Physical Anesthesia Plan  ASA: III  Anesthesia Plan: General   Post-op Pain Management:    Induction: Intravenous  Airway Management Planned: LMA  Additional Equipment:   Intra-op Plan:   Post-operative Plan: Extubation in OR  Informed Consent: I have reviewed the patients History and Physical, chart, labs and discussed the procedure including the risks, benefits and alternatives for the proposed anesthesia with the patient or authorized representative who has indicated his/her understanding and acceptance.   Dental advisory given  Plan Discussed with: CRNA and Surgeon  Anesthesia Plan Comments: (glidescope if patient needs to be intubated)        Anesthesia Quick Evaluation

## 2016-02-22 NOTE — Op Note (Signed)
Preoperative diagnosis: bladder cancer  Postoperative diagnosis: Same  Procedure: 1 cystoscopy 2. Bladder biopsy with fulgeration 3. fulgeration of 11 small papillary tumors  Attending: [name]  Anesthesia: General  Estimated blood loss: Minimal  Drains: 22 French foley  Specimens:  Bladder biopsies x 3  Antibiotics: ancef  Findings:  Ureteral orifices in normal anatomic location. No hydronephrosis or filling defects in either collecting system. Numerous small papillary bladder tumors. Area of posterior wall erythema concerning for CIS  Indications: Patient is a 55 year old male with a history of TaG3 bladder cancer status post BCG therapy. He was found to have multiple small papillary tumors on office cystoscopy.. After discussing treatment options, they decided proceed with bladder biopsy and selective cytologies.  Procedure her in detail: The patient was brought to the operating room and a brief timeout was done to ensure correct patient, correct procedure, correct site. General anesthesia was administered patient was placed in dorsal lithotomy position. Their genitalia was then prepped and draped in usual sterile fashion. A rigid 59 French cystoscope was passed in the urethra and the bladder. Bladder was inspected and we noted 4 scars consistent with previous resection. the ureteral orifices were in the normal orthotopic locations. We then removed the cystoscope and placed a resectoscope into the bladder. We took a biopsy from the posterier wall and right and left lateral walls. Hemostasis was then obtained with electrocautery. We then proceeded to fulgerate 11 small papillary tumors throughout the bladder.. the bladder was then drained, a 22 French foley was placed and this concluded the procedure which was well tolerated by patient.  Complications: None  Condition: Stable, extubated, transferred to PACU  Plan: Patient will be discharged home and will followup in  5 days for voiding trial

## 2016-02-22 NOTE — Discharge Instructions (Signed)
°Post Anesthesia Home Care Instructions ° °Activity: °Get plenty of rest for the remainder of the day. A responsible adult should stay with you for 24 hours following the procedure.  °For the next 24 hours, DO NOT: °-Drive a car °-Operate machinery °-Drink alcoholic beverages °-Take any medication unless instructed by your physician °-Make any legal decisions or sign important papers. ° °Meals: °Start with liquid foods such as gelatin or soup. Progress to regular foods as tolerated. Avoid greasy, spicy, heavy foods. If nausea and/or vomiting occur, drink only clear liquids until the nausea and/or vomiting subsides. Call your physician if vomiting continues. ° °Special Instructions/Symptoms: °Your throat may feel dry or sore from the anesthesia or the breathing tube placed in your throat during surgery. If this causes discomfort, gargle with warm salt water. The discomfort should disappear within 24 hours. ° °If you had a scopolamine patch placed behind your ear for the management of post- operative nausea and/or vomiting: ° °1. The medication in the patch is effective for 72 hours, after which it should be removed.  Wrap patch in a tissue and discard in the trash. Wash hands thoroughly with soap and water. °2. You may remove the patch earlier than 72 hours if you experience unpleasant side effects which may include dry mouth, dizziness or visual disturbances. °3. Avoid touching the patch. Wash your hands with soap and water after contact with the patch. °  °Foley Catheter Care, Adult °A Foley catheter is a soft, flexible tube. This tube is placed into your bladder to drain pee (urine). If you go home with this catheter in place, follow the instructions below. °TAKING CARE OF THE CATHETER °1. Wash your hands with soap and water. °2. Put soap and water on a clean washcloth. °¨ Clean the skin where the tube goes into your body. °§ Clean away from the tube site. °§ Never wipe toward the tube. °§ Clean the area using a  circular motion. °¨ Remove all the soap. Pat the area dry with a clean towel. For males, reposition the skin that covers the end of the penis (foreskin). °3. Attach the tube to your leg with tape or a leg strap. Do not stretch the tube tight. If you are using tape, remove any stickiness left behind by past tape you used. °4. Keep the drainage bag below your hips. Keep it off the floor. °5. Check your tube during the day. Make sure it is working and draining. Make sure the tube does not curl, twist, or bend. °6. Do not pull on the tube or try to take it out. °TAKING CARE OF THE DRAINAGE BAGS °You will have a large overnight drainage bag and a small leg bag. You may wear the overnight bag any time. Never wear the small bag at night. Follow the directions below. °Emptying the Drainage Bag °Empty your drainage bag when it is  -½ full or at least 2-3 times a day. °1. Wash your hands with soap and water. °2. Keep the drainage bag below your hips. °3. Hold the dirty bag over the toilet or clean container. °4. Open the pour spout at the bottom of the bag. Empty the pee into the toilet or container. Do not let the pour spout touch anything. °5. Clean the pour spout with a gauze pad or cotton ball that has rubbing alcohol on it. °6. Close the pour spout. °7. Attach the bag to your leg with tape or a leg strap. °8. Wash your hands well. °Changing   the Drainage Bag °Change your bag once a month or sooner if it starts to smell or look dirty.  °1. Wash your hands with soap and water. °2. Pinch the rubber tube so that pee does not spill out. °3. Disconnect the catheter tube from the drainage tube at the connection valve. Do not let the tubes touch anything. °4. Clean the end of the catheter tube with an alcohol wipe. Clean the end of a the drainage tube with a different alcohol wipe. °5. Connect the catheter tube to the drainage tube of the clean drainage bag. °6. Attach the new bag to the leg with tape or a leg strap. Avoid  attaching the new bag too tightly. °7. Wash your hands well. °Cleaning the Drainage Bag °1. Wash your hands with soap and water. °2. Wash the bag in warm, soapy water. °3. Rinse the bag with warm water. °4. Fill the bag with a mixture of white vinegar and water (1 cup vinegar to 1 quart warm water [.2 liter vinegar to 1 liter warm water]). Close the bag and soak it for 30 minutes in the solution. °5. Rinse the bag with warm water. °6. Hang the bag to dry with the pour spout open and hanging downward. °7. Store the clean bag (once it is dry) in a clean plastic bag. °8. Wash your hands well. °PREVENT INFECTION °· Wash your hands before and after touching your tube. °· Take showers every day. Wash the skin where the tube enters your body. Do not take baths. Replace wet leg straps with dry ones, if this applies. °· Do not use powders, sprays, or lotions on the genital area. Only use creams, lotions, or ointments as told by your doctor. °· For females, wipe from front to back after going to the bathroom. °· Drink enough fluids to keep your pee clear or pale yellow unless you are told not to have too much fluid (fluid restriction). °· Do not let the drainage bag or tubing touch or lie on the floor. °· Wear cotton underwear to keep the area dry. °GET HELP IF: °· Your pee is cloudy or smells unusually bad. °· Your tube becomes clogged. °· You are not draining pee into the bag or your bladder feels full. °· Your tube starts to leak. °GET HELP RIGHT AWAY IF: °· You have pain, puffiness (swelling), redness, or yellowish-white fluid (pus) where the tube enters the body. °· You have pain in the belly (abdomen), legs, lower back, or bladder. °· You have a fever. °· You see blood fill the tube, or your pee is pink or red. °· You feel sick to your stomach (nauseous), throw up (vomit), or have chills. °· Your tube gets pulled out. °MAKE SURE YOU:  °· Understand these instructions. °· Will watch your condition. °· Will get help right  away if you are not doing well or get worse. °  °This information is not intended to replace advice given to you by your health care provider. Make sure you discuss any questions you have with your health care provider. °  °Document Released: 08/27/2012 Document Revised: 05/23/2014 Document Reviewed: 08/27/2012 °Elsevier Interactive Patient Education ©2016 [employer] ° °

## 2016-02-22 NOTE — Anesthesia Procedure Notes (Signed)
Procedure Name: LMA Insertion Date/Time: 02/22/2016 1:07 PM Performed by: Justice Rocher Pre-anesthesia Checklist: Patient identified, Emergency Drugs available, Suction available and Patient being monitored Patient Re-evaluated:Patient Re-evaluated prior to inductionOxygen Delivery Method: Circle system utilized Preoxygenation: Pre-oxygenation with 100% oxygen Intubation Type: IV induction Ventilation: Mask ventilation without difficulty LMA: LMA inserted LMA Size: 5.0 Number of attempts: 1 Airway Equipment and Method: Bite block Placement Confirmation: positive ETCO2 Tube secured with: Tape Dental Injury: Teeth and Oropharynx as per pre-operative assessment

## 2016-02-22 NOTE — Transfer of Care (Signed)
Immediate Anesthesia Transfer of Care Note  Patient: KHOA BERLING  Procedure(s) Performed: Procedure(s) (LRB): TRANSURETHRAL RESECTION OF BLADDER TUMOR (TURBT) (N/A)  Patient Location: PACU  Anesthesia Type: General  Level of Consciousness: awake, sedated, patient cooperative and responds to stimulation  Airway & Oxygen Therapy: Patient Spontanous Breathing and Patient connected to face mask oxygen  Post-op Assessment: Report given to PACU RN, Post -op Vital signs reviewed and stable and Patient moving all extremities  Post vital signs: Reviewed and stable  Complications: No apparent anesthesia complications

## 2016-02-22 NOTE — Anesthesia Postprocedure Evaluation (Signed)
Anesthesia Post Note  Patient: Christian [name]  Procedure(s) Performed: Procedure(s) (LRB): TRANSURETHRAL RESECTION OF BLADDER TUMOR (TURBT) (N/A)  Patient location during evaluation: PACU Anesthesia Type: General Level of consciousness: awake and alert Pain management: pain level controlled Vital Signs Assessment: post-procedure vital signs reviewed and stable Respiratory status: spontaneous breathing, nonlabored ventilation, respiratory function stable and patient connected to nasal cannula oxygen Cardiovascular status: blood pressure returned to baseline and stable Postop Assessment: no signs of nausea or vomiting Anesthetic complications: no    Last Vitals:  Vitals:   02/22/16 1345 02/22/16 1400  BP: (!) 156/106 140/77  Pulse: 70 65  Resp: 14 16  Temp: 36.8 C     Last Pain:  Vitals:   02/22/16 1345  TempSrc:   PainSc: [place]

## 2016-02-23 ENCOUNTER — Encounter (HOSPITAL_BASED_OUTPATIENT_CLINIC_OR_DEPARTMENT_OTHER): Payer: Self-pay | Admitting: Urology

## 2016-04-25 ENCOUNTER — Ambulatory Visit: Payer: 59 | Admitting: Internal Medicine

## 2016-05-03 ENCOUNTER — Other Ambulatory Visit: Payer: Self-pay | Admitting: Family

## 2016-05-19 ENCOUNTER — Encounter: Payer: Self-pay | Admitting: Internal Medicine

## 2016-05-19 ENCOUNTER — Ambulatory Visit (INDEPENDENT_AMBULATORY_CARE_PROVIDER_SITE_OTHER): Payer: 59 | Admitting: Internal Medicine

## 2016-05-19 DIAGNOSIS — M25571 Pain in right ankle and joints of right foot: Secondary | ICD-10-CM | POA: Diagnosis not present

## 2016-05-19 DIAGNOSIS — I10 Essential (primary) hypertension: Secondary | ICD-10-CM

## 2016-05-19 DIAGNOSIS — C670 Malignant neoplasm of trigone of bladder: Secondary | ICD-10-CM | POA: Diagnosis not present

## 2016-05-19 DIAGNOSIS — R635 Abnormal weight gain: Secondary | ICD-10-CM | POA: Diagnosis not present

## 2016-05-19 MED ORDER — LISINOPRIL 20 MG PO TABS
20.0000 mg | ORAL_TABLET | Freq: Two times a day (BID) | ORAL | 1 refills | Status: DC
Start: 2016-05-19 — End: 2016-09-20

## 2016-05-19 MED ORDER — INDOMETHACIN 50 MG PO CAPS: 50.0000 mg | ORAL_CAPSULE | Freq: Three times a day (TID) | ORAL | 3 refills | Status: DC | PRN

## 2016-05-19 MED ORDER — ALBUTEROL SULFATE HFA 108 (90 BASE) MCG/ACT IN AERS: 2.0000 | INHALATION_SPRAY | Freq: Four times a day (QID) | RESPIRATORY_TRACT | 3 refills | Status: DC | PRN

## 2016-05-19 MED ORDER — HYDROCODONE-ACETAMINOPHEN 7.5-325 MG PO TABS: 1.0000 | ORAL_TABLET | Freq: Four times a day (QID) | ORAL | 0 refills | Status: DC | PRN

## 2016-05-19 MED ORDER — COLCHICINE 0.6 MG PO TABS: 0.6000 mg | ORAL_TABLET | Freq: Every day | ORAL | 0 refills | Status: DC | PRN

## 2016-05-19 NOTE — Assessment & Plan Note (Addendum)
F/u w/Urology CT w/Urology

## 2016-05-19 NOTE — Assessment & Plan Note (Signed)
Wt Readings from Last 3 Encounters:  05/19/16 256 lb 1.3 oz (116.2 kg)  02/22/16 247 lb (112 kg)  12/25/15 249 lb (112.9 kg)  Discussed

## 2016-05-19 NOTE — Progress Notes (Signed)
Subjective:  Patient ID: Christian [name], male    DOB: December 01, 1960  Age: 56 y.o. MRN: [id number]  CC: Follow-up (4 months)   HPI Christian [name] presents for HTN, gout, bladder ca. He had severe gout 3 weeks ago post-chemo.  Outpatient Medications Prior to Visit  Medication Sig Dispense Refill  . amLODipine (NORVASC) 5 MG tablet Take 1 tablet (5 mg total) by mouth daily. 90 tablet 3  . aspirin EC 325 MG tablet Take 325 mg by mouth daily.    . clonazePAM (KLONOPIN) 1 MG tablet Take 1 tablet (1 mg total) by mouth 2 (two) times daily as needed for anxiety. 60 tablet 5  . HYDROcodone-acetaminophen (NORCO) 7.5-325 MG tablet Take 1 tablet by mouth every 6 (six) hours as needed for moderate pain or severe pain. 60 tablet 0  . oxybutynin (DITROPAN) 5 MG tablet Take 1 tablet (5 mg total) by mouth 3 (three) times daily. 30 tablet 0  . oxyCODONE-acetaminophen (ROXICET) 5-325 MG tablet Take 1 tablet by mouth every 4 (four) hours as needed for severe pain. 30 tablet 0  . PROAIR HFA 108 (90 Base) MCG/ACT inhaler INHALE 2 PUFFS INTO THE LUNGS EVERY 6 (SIX) HOURS AS NEEDED FOR WHEEZING. FOR SHORTNESS OF BREATH. 8.5 Inhaler 3  . ranitidine (ZANTAC) 150 MG tablet Take 150 mg by mouth daily as needed for heartburn.     . sildenafil (VIAGRA) 100 MG tablet TAKE 1 TABLET BY MOUTH AS NEEDED 12 tablet 8  . colchicine 0.6 MG tablet Take 1 tablet (0.6 mg total) by mouth daily. (Patient taking differently: Take 0.6 mg by mouth daily as needed. ) 20 tablet 2  . indomethacin (INDOCIN) 50 MG capsule Take 1 capsule (50 mg total) by mouth 3 (three) times daily as needed for moderate pain. 30 capsule 3  . lisinopril (PRINIVIL,ZESTRIL) 20 MG tablet Take 1 tablet (20 mg total) by mouth 2 (two) times daily. 180 tablet 3   No facility-administered medications prior to visit.     ROS Review of Systems  Constitutional: Negative for appetite change, fatigue and unexpected weight change.  HENT: Negative for congestion,  nosebleeds, sneezing, sore throat and trouble swallowing.   Eyes: Negative for itching and visual disturbance.  Respiratory: Negative for cough.   Cardiovascular: Negative for chest pain, palpitations and leg swelling.  Gastrointestinal: Negative for abdominal distention, blood in stool, diarrhea and nausea.  Genitourinary: Negative for frequency and hematuria.  Musculoskeletal: Positive for arthralgias and back pain. Negative for gait problem, joint swelling and neck pain.  Skin: Negative for rash.  Neurological: Negative for dizziness, tremors, speech difficulty and weakness.  Psychiatric/Behavioral: Negative for agitation, dysphoric mood and sleep disturbance. The patient is not nervous/anxious.     Objective:  BP 130/82   Pulse 77   Temp 98.2 F (36.8 C) (Oral)   Wt 256 lb 1.3 oz (116.2 kg)   BMI 38.94 kg/m   BP Readings from Last 3 Encounters:  05/19/16 130/82  02/22/16 (!) 159/89  12/25/15 139/89    Wt Readings from Last 3 Encounters:  05/19/16 256 lb 1.3 oz (116.2 kg)  02/22/16 247 lb (112 kg)  12/25/15 249 lb (112.9 kg)    Physical Exam  Constitutional: He is oriented to person, place, and time. He appears well-developed. No distress.  NAD  HENT:  Mouth/Throat: Oropharynx is clear and moist.  Eyes: Conjunctivae are normal. Pupils are equal, round, and reactive to light.  Neck: Normal range of motion. No JVD present.  No thyromegaly present.  Cardiovascular: Normal rate, regular rhythm, normal heart sounds and intact distal pulses.  Exam reveals no gallop and no friction rub.   No murmur heard. Pulmonary/Chest: Effort normal and breath sounds normal. No respiratory distress. He has no wheezes. He has no rales. He exhibits no tenderness.  Abdominal: Soft. Bowel sounds are normal. He exhibits no distension and no mass. There is no tenderness. There is no rebound and no guarding.  Musculoskeletal: Normal range of motion. He exhibits tenderness. He exhibits no edema.    Lymphadenopathy:    He has no cervical adenopathy.  Neurological: He is alert and oriented to person, place, and time. He has normal reflexes. No cranial nerve deficit. He exhibits normal muscle tone. He displays a negative Romberg sign. Coordination and gait normal.  Skin: Skin is warm and dry. No rash noted.  Psychiatric: He has a normal mood and affect. His behavior is normal. Judgment and thought content normal.  Obese  Lab Results  Component Value Date   WBC 8.0 11/24/2015   HGB 13.9 02/22/2016   HCT 41.0 02/22/2016   PLT 252.0 11/24/2015   GLUCOSE 96 02/22/2016   CHOL 234 (H) 06/03/2015   TRIG 190.0 (H) 06/03/2015   HDL 39.30 06/03/2015   LDLDIRECT 129.0 11/20/2014   LDLCALC 156 (H) 06/03/2015   ALT 41 06/03/2015   AST 35 06/03/2015   NA 141 02/22/2016   K 4.1 02/22/2016   CL 104 02/22/2016   CREATININE 0.80 02/22/2016   BUN 14 02/22/2016   CO2 28 12/25/2015   TSH 1.10 12/25/2015   PSA 0.75 11/20/2014   INR 1.01 03/28/2012   HGBA1C 5.5 05/13/2014    No results found.  Assessment & Plan:   There are no diagnoses linked to this encounter. I have changed Mr. Lilja colchicine. I am also having him maintain his sildenafil, aspirin EC, ranitidine, amLODipine, HYDROcodone-acetaminophen, clonazePAM, oxyCODONE-acetaminophen, oxybutynin, PROAIR HFA, indomethacin, and lisinopril.  Meds ordered this encounter  Medications  . colchicine 0.6 MG tablet    Sig: Take 1 tablet (0.6 mg total) by mouth daily as needed.    Dispense:  30 tablet    Refill:  0  . indomethacin (INDOCIN) 50 MG capsule    Sig: Take 1 capsule (50 mg total) by mouth 3 (three) times daily as needed for moderate pain.    Dispense:  30 capsule    Refill:  3  . lisinopril (PRINIVIL,ZESTRIL) 20 MG tablet    Sig: Take 1 tablet (20 mg total) by mouth 2 (two) times daily.    Dispense:  180 tablet    Refill:  1     Follow-up: No Follow-up on file.  Walker Kehr, MD

## 2016-05-19 NOTE — Progress Notes (Signed)
Pre visit review using our clinic review tool, if applicable. No additional management support is needed unless otherwise documented below in the visit note. 

## 2016-05-19 NOTE — Assessment & Plan Note (Signed)
On Lisinopril - denies cough Norvasc

## 2016-05-19 NOTE — Assessment & Plan Note (Signed)
Colchicine, Indocin prn gout Norco prn  Potential benefits of a long term opioids use as well as potential risks (i.e. addiction risk, apnea etc) and complications (i.e. Somnolence, constipation and others) were explained to the patient and were aknowledged.

## 2016-06-28 ENCOUNTER — Other Ambulatory Visit: Payer: Self-pay | Admitting: Internal Medicine

## 2016-06-30 NOTE — Telephone Encounter (Signed)
Called refill into [facility] had to leave on pharmacy vm../lmb 

## 2016-07-13 ENCOUNTER — Other Ambulatory Visit: Payer: Self-pay | Admitting: Internal Medicine

## 2016-07-18 DIAGNOSIS — C670 Malignant neoplasm of trigone of bladder: Secondary | ICD-10-CM | POA: Diagnosis not present

## 2016-07-18 DIAGNOSIS — C672 Malignant neoplasm of lateral wall of bladder: Secondary | ICD-10-CM | POA: Diagnosis not present

## 2016-07-19 ENCOUNTER — Other Ambulatory Visit: Payer: Self-pay | Admitting: Urology

## 2016-07-29 ENCOUNTER — Encounter (HOSPITAL_BASED_OUTPATIENT_CLINIC_OR_DEPARTMENT_OTHER): Payer: Self-pay | Admitting: *Deleted

## 2016-08-01 ENCOUNTER — Encounter (HOSPITAL_BASED_OUTPATIENT_CLINIC_OR_DEPARTMENT_OTHER): Payer: Self-pay | Admitting: *Deleted

## 2016-08-01 NOTE — Progress Notes (Addendum)
To [facility] at 1045- Istat on arrival-Ekg with chart. Instructed Npo after Mn-will take norvasc,amlodipine,zantac with water in am.

## 2016-08-05 ENCOUNTER — Ambulatory Visit (HOSPITAL_BASED_OUTPATIENT_CLINIC_OR_DEPARTMENT_OTHER): Payer: 59 | Admitting: Anesthesiology

## 2016-08-05 ENCOUNTER — Encounter (HOSPITAL_BASED_OUTPATIENT_CLINIC_OR_DEPARTMENT_OTHER): Payer: Self-pay | Admitting: *Deleted

## 2016-08-05 ENCOUNTER — Ambulatory Visit (HOSPITAL_BASED_OUTPATIENT_CLINIC_OR_DEPARTMENT_OTHER)
Admission: RE | Admit: 2016-08-05 | Discharge: 2016-08-05 | Disposition: A | Discharge status: Home / Self Care | Payer: 59 | Source: Ambulatory Visit | Attending: Urology | Admitting: Urology

## 2016-08-05 ENCOUNTER — Encounter (HOSPITAL_BASED_OUTPATIENT_CLINIC_OR_DEPARTMENT_OTHER)
Admission: RE | Disposition: A | Discharge status: Home / Self Care | Payer: Self-pay | Source: Ambulatory Visit | Attending: Urology

## 2016-08-05 DIAGNOSIS — N529 Male erectile dysfunction, unspecified: Secondary | ICD-10-CM | POA: Diagnosis not present

## 2016-08-05 DIAGNOSIS — Z79899 Other long term (current) drug therapy: Secondary | ICD-10-CM | POA: Insufficient documentation

## 2016-08-05 DIAGNOSIS — Z8551 Personal history of malignant neoplasm of bladder: Secondary | ICD-10-CM | POA: Diagnosis not present

## 2016-08-05 DIAGNOSIS — Z87891 Personal history of nicotine dependence: Secondary | ICD-10-CM | POA: Diagnosis not present

## 2016-08-05 DIAGNOSIS — I10 Essential (primary) hypertension: Secondary | ICD-10-CM | POA: Diagnosis not present

## 2016-08-05 DIAGNOSIS — M109 Gout, unspecified: Secondary | ICD-10-CM | POA: Insufficient documentation

## 2016-08-05 DIAGNOSIS — C679 Malignant neoplasm of bladder, unspecified: Secondary | ICD-10-CM | POA: Diagnosis not present

## 2016-08-05 DIAGNOSIS — D090 Carcinoma in situ of bladder: Secondary | ICD-10-CM | POA: Diagnosis not present

## 2016-08-05 DIAGNOSIS — Z7982 Long term (current) use of aspirin: Secondary | ICD-10-CM | POA: Insufficient documentation

## 2016-08-05 DIAGNOSIS — F419 Anxiety disorder, unspecified: Secondary | ICD-10-CM | POA: Insufficient documentation

## 2016-08-05 DIAGNOSIS — J45909 Unspecified asthma, uncomplicated: Secondary | ICD-10-CM | POA: Diagnosis not present

## 2016-08-05 DIAGNOSIS — E669 Obesity, unspecified: Secondary | ICD-10-CM | POA: Diagnosis not present

## 2016-08-05 DIAGNOSIS — K219 Gastro-esophageal reflux disease without esophagitis: Secondary | ICD-10-CM | POA: Insufficient documentation

## 2016-08-05 DIAGNOSIS — D494 Neoplasm of unspecified behavior of bladder: Secondary | ICD-10-CM | POA: Diagnosis not present

## 2016-08-05 DIAGNOSIS — Z6839 Body mass index (BMI) 39.0-39.9, adult: Secondary | ICD-10-CM | POA: Diagnosis not present

## 2016-08-05 DIAGNOSIS — E785 Hyperlipidemia, unspecified: Secondary | ICD-10-CM | POA: Diagnosis not present

## 2016-08-05 HISTORY — PX: TRANSURETHRAL RESECTION OF BLADDER TUMOR: SHX2575

## 2016-08-05 HISTORY — PX: CYSTOSCOPY WITH FULGERATION: SHX6638

## 2016-08-05 LAB — POCT I-STAT 4, (NA,K, GLUC, HGB,HCT)
GLUCOSE: 99 mg/dL (ref 65–99)
HEMATOCRIT: 37 % — AB (ref 39.0–52.0)
Hemoglobin: 12.6 g/dL — ABNORMAL LOW (ref 13.0–17.0)
Potassium: 4.1 mmol/L (ref 3.5–5.1)
Sodium: 140 mmol/L (ref 135–145)

## 2016-08-05 SURGERY — TURBT (TRANSURETHRAL RESECTION OF BLADDER TUMOR)
Anesthesia: Epidural | Site: Bladder

## 2016-08-05 MED ORDER — DEXAMETHASONE SODIUM PHOSPHATE 4 MG/ML IJ SOLN
INTRAMUSCULAR | Status: DC | PRN
  Administered 2016-08-05: 10 mg via INTRAVENOUS

## 2016-08-05 MED ORDER — ONDANSETRON HCL 4 MG/2ML IJ SOLN
INTRAMUSCULAR | Status: DC | PRN
  Administered 2016-08-05: 4 mg via INTRAVENOUS

## 2016-08-05 MED ORDER — ONDANSETRON HCL 4 MG/2ML IJ SOLN
INTRAMUSCULAR | Status: AC
  Filled 2016-08-05: qty 2

## 2016-08-05 MED ORDER — FENTANYL CITRATE (PF) 100 MCG/2ML IJ SOLN
INTRAMUSCULAR | Status: DC | PRN
  Administered 2016-08-05: 50 ug via INTRAVENOUS

## 2016-08-05 MED ORDER — LIDOCAINE 2% (20 MG/ML) 5 ML SYRINGE
INTRAMUSCULAR | Status: AC
  Filled 2016-08-05: qty 5

## 2016-08-05 MED ORDER — LACTATED RINGERS IV SOLN
INTRAVENOUS | Status: DC
  Administered 2016-08-05: 11:00:00 via INTRAVENOUS
  Filled 2016-08-05: qty 1000

## 2016-08-05 MED ORDER — HYDROCODONE-ACETAMINOPHEN 7.5-325 MG PO TABS: 1.0000 | ORAL_TABLET | Freq: Four times a day (QID) | ORAL | 0 refills | Status: DC | PRN

## 2016-08-05 MED ORDER — DEXAMETHASONE SODIUM PHOSPHATE 10 MG/ML IJ SOLN
INTRAMUSCULAR | Status: AC
  Filled 2016-08-05: qty 1

## 2016-08-05 MED ORDER — EPHEDRINE SULFATE-NACL 50-0.9 MG/10ML-% IV SOSY
PREFILLED_SYRINGE | INTRAVENOUS | Status: DC | PRN
  Administered 2016-08-05: 15 mg via INTRAVENOUS

## 2016-08-05 MED ORDER — LIDOCAINE 2% (20 MG/ML) 5 ML SYRINGE
INTRAMUSCULAR | Status: DC | PRN
  Administered 2016-08-05: 100 mg via INTRAVENOUS

## 2016-08-05 MED ORDER — STERILE WATER FOR IRRIGATION IR SOLN
Status: DC | PRN
  Administered 2016-08-05: 3000 mL

## 2016-08-05 MED ORDER — EPHEDRINE 5 MG/ML INJ
INTRAVENOUS | Status: AC
  Filled 2016-08-05: qty 10

## 2016-08-05 MED ORDER — OXYBUTYNIN CHLORIDE 5 MG PO TABS
ORAL_TABLET | ORAL | Status: AC
  Filled 2016-08-05: qty 1

## 2016-08-05 MED ORDER — MIDAZOLAM HCL 2 MG/2ML IJ SOLN
INTRAMUSCULAR | Status: AC
  Filled 2016-08-05: qty 2

## 2016-08-05 MED ORDER — PROPOFOL 10 MG/ML IV BOLUS
INTRAVENOUS | Status: DC | PRN
  Administered 2016-08-05: 200 mg via INTRAVENOUS

## 2016-08-05 MED ORDER — CEFAZOLIN SODIUM-DEXTROSE 2-4 GM/100ML-% IV SOLN
INTRAVENOUS | Status: AC
  Filled 2016-08-05: qty 100

## 2016-08-05 MED ORDER — PROPOFOL 10 MG/ML IV BOLUS
INTRAVENOUS | Status: AC
  Filled 2016-08-05: qty 40

## 2016-08-05 MED ORDER — CEFAZOLIN IN D5W 1 GM/50ML IV SOLN
1.0000 g | INTRAVENOUS | Status: AC
  Administered 2016-08-05: 2 g via INTRAVENOUS
  Filled 2016-08-05: qty 50

## 2016-08-05 MED ORDER — FENTANYL CITRATE (PF) 100 MCG/2ML IJ SOLN
INTRAMUSCULAR | Status: AC
  Filled 2016-08-05: qty 2

## 2016-08-05 MED ORDER — SODIUM CHLORIDE 0.9 % IR SOLN
Status: DC | PRN
  Administered 2016-08-05: 6000 mL via INTRAVESICAL

## 2016-08-05 MED ORDER — OXYBUTYNIN CHLORIDE 5 MG PO TABS: 5.0000 mg | ORAL_TABLET | Freq: Three times a day (TID) | ORAL | 0 refills | Status: DC

## 2016-08-05 MED ORDER — FENTANYL CITRATE (PF) 100 MCG/2ML IJ SOLN
25.0000 ug | INTRAMUSCULAR | Status: DC | PRN
  Filled 2016-08-05: qty 1

## 2016-08-05 SURGICAL SUPPLY — 36 items
BAG DRAIN URO-CYSTO SKYTR STRL (DRAIN) ×5 IMPLANT
BAG DRN ANRFLXCHMBR STRAP LEK (BAG)
BAG DRN UROCATH (DRAIN) ×2
BAG URINE DRAINAGE (UROLOGICAL SUPPLIES) IMPLANT
BAG URINE LEG 19OZ MD ST LTX (BAG) IMPLANT
BAG URINE LEG 500ML (DRAIN) IMPLANT
BASKET ZERO TIP NITINOL 2.4FR (BASKET) IMPLANT
BSKT STON RTRVL ZERO TP 2.4FR (BASKET)
CATH FOLEY 2WAY SLVR  5CC 22FR (CATHETERS)
CATH FOLEY 2WAY SLVR 30CC 20FR (CATHETERS) IMPLANT
CATH FOLEY 2WAY SLVR 30CC 22FR (CATHETERS) IMPLANT
CATH FOLEY 2WAY SLVR 5CC 22FR (CATHETERS) IMPLANT
CATH FOLEY 3WAY 30CC 22F (CATHETERS) IMPLANT
CATH INTERMIT  6FR 70CM (CATHETERS) IMPLANT
CLOTH BEACON ORANGE TIMEOUT ST (SAFETY) ×4 IMPLANT
ELECT REM PT RETURN 9FT ADLT (ELECTROSURGICAL) ×4
ELECTRODE REM PT RTRN 9FT ADLT (ELECTROSURGICAL) ×2 IMPLANT
EVACUATOR MICROVAS BLADDER (UROLOGICAL SUPPLIES) IMPLANT
GLOVE BIO SURGEON STRL SZ 6.5 (GLOVE) ×4 IMPLANT
GLOVE BIO SURGEON STRL SZ8 (GLOVE) ×4 IMPLANT
GLOVE BIO SURGEONS STRL SZ 6.5 (GLOVE) ×2
GOWN STRL REUS W/ TWL LRG LVL3 (GOWN DISPOSABLE) ×6 IMPLANT
GOWN STRL REUS W/TWL LRG LVL3 (GOWN DISPOSABLE) ×12
GOWN STRL REUS W/TWL XL LVL3 (GOWN DISPOSABLE) ×3 IMPLANT
GUIDEWIRE ANG ZIPWIRE 038X150 (WIRE) IMPLANT
GUIDEWIRE STR DUAL SENSOR (WIRE) ×4 IMPLANT
IV NS IRRIG 3000ML ARTHROMATIC (IV SOLUTION) ×7 IMPLANT
KIT RM TURNOVER CYSTO AR (KITS) ×4 IMPLANT
MANIFOLD NEPTUNE II (INSTRUMENTS) ×4 IMPLANT
PACK CYSTO (CUSTOM PROCEDURE TRAY) ×4 IMPLANT
PLUG CATH AND CAP STER (CATHETERS) IMPLANT
SET ASPIRATION TUBING (TUBING) IMPLANT
SYR 20CC LL (SYRINGE) IMPLANT
SYRINGE IRR TOOMEY STRL 70CC (SYRINGE) IMPLANT
TUBE CONNECTING 12'X1/4 (SUCTIONS) ×1
TUBE CONNECTING 12X1/4 (SUCTIONS) ×2 IMPLANT

## 2016-08-05 NOTE — Transfer of Care (Signed)
Immediate Anesthesia Transfer of Care Note  Patient: Christian [name]  Procedure(s) Performed: Procedure(s): TRANSURETHRAL RESECTION OF BLADDER TUMOR (TURBT) (N/A) CYSTOSCOPY WITH FULGERATION (N/A)  Patient Location: PACU  Anesthesia Type:General  Level of Consciousness: awake, alert , oriented and patient cooperative  Airway & Oxygen Therapy: Patient Spontanous Breathing and Patient connected to nasal cannula oxygen  Post-op Assessment: Report given to RN and Post -op Vital signs reviewed and stable  Post vital signs: Reviewed and stable  Last Vitals:  Vitals:   08/05/16 1054  BP: (!) 146/89  Pulse: 66  Resp: 18  Temp: 36.9 C    Last Pain:  Vitals:   08/05/16 1054  TempSrc: Oral      Patients Stated Pain Goal: 6 (44/46/19 0122)  Complications: No apparent anesthesia complications

## 2016-08-05 NOTE — Anesthesia Procedure Notes (Signed)
Procedure Name: LMA Insertion Date/Time: 08/05/2016 12:53 PM Performed by: Wanita Chamberlain Pre-anesthesia Checklist: Patient identified, Timeout performed, Emergency Drugs available, Suction available and Patient being monitored Patient Re-evaluated:Patient Re-evaluated prior to inductionOxygen Delivery Method: Circle system utilized Preoxygenation: Pre-oxygenation with 100% oxygen Intubation Type: IV induction Ventilation: Mask ventilation without difficulty LMA: LMA inserted LMA Size: 5.0 Number of attempts: 1 Placement Confirmation: positive ETCO2 and breath sounds checked- equal and bilateral Tube secured with: Tape Dental Injury: Teeth and Oropharynx as per pre-operative assessment

## 2016-08-05 NOTE — Discharge Instructions (Signed)
Bladder Biopsy, Care After Refer to this sheet in the next few weeks. These instructions provide you with information about caring for yourself after your procedure. Your health care provider may also give you more specific instructions. Your treatment has been planned according to current medical practices, but problems sometimes occur. Call your health care provider if you have any problems or questions after your procedure. What can I expect after the procedure? After the procedure, it is common to have:  Mild pain in your bladder or kidney area during urination.  Minor burning during urination.  Small amounts of blood in your urine.  A sudden urge to urinate.  A need to urinate more often than usual. Follow these instructions at home: Medicines   Take over-the-counter and prescription medicines only as told by your health care provider.  If you were prescribed an antibiotic medicine, take it as told by your health care provider. Do not stop taking the antibiotic even if you start to feel better. General instructions    Take a warm bath to relieve any burning sensations around your urethra.  Hold a warm, damp washcloth over the urethral area to ease pain.  Return to your normal activities as told by your health care provider. Ask your health care provider what activities are safe for you.  Do not drive for 24 hours if you received a medicine to help you relax (sedative) during your procedure. Ask your health care provider when it is safe for you to drive.  It is your responsibility to get the results of your procedure. Ask your health care provider or the department performing the procedure when your results will be ready.  Keep all follow-up visits as told by your health care provider. This is important. Contact a health care provider if:  You have a fever.  Your symptoms do not improve within 24 hours and you continue to have:  Burning during urination.  Increasing  amounts of blood in your urine.  Pain during urination.  An urgent need to urinate.  A need to urinate more often than usual. Get help right away if:  You have a lot of bleeding or more bleeding.  You have severe pain.  You are unable to urinate.  You have bright red blood in your urine.  You are passing blood clots in your urine.  You have a fever.  You have swelling, redness, or pain in your legs.  You have difficulty breathing. This information is not intended to replace advice given to you by your health care provider. Make sure you discuss any questions you have with your health care provider. Document Released: 05/19/2015 Document Revised: 10/08/2015 Document Reviewed: 05/19/2015 Elsevier Interactive Patient Education  2017 [place] Anesthesia Home Care Instructions  Activity: Get plenty of rest for the remainder of the day. A responsible individual must stay with you for 24 hours following the procedure.  For the next 24 hours, DO NOT: -Drive a car -Paediatric nurse -Drink alcoholic beverages -Take any medication unless instructed by your physician -Make any legal decisions or sign important papers.  Meals: Start with liquid foods such as gelatin or soup. Progress to regular foods as tolerated. Avoid greasy, spicy, heavy foods. If nausea and/or vomiting occur, drink only clear liquids until the nausea and/or vomiting subsides. Call your physician if vomiting continues.  Special Instructions/Symptoms: Your throat may feel dry or sore from the anesthesia or the breathing tube placed in your throat during surgery. If  this causes discomfort, gargle with warm salt water. The discomfort should disappear within 24 hours.  If you had a scopolamine patch placed behind your ear for the management of post- operative nausea and/or vomiting:  1. The medication in the patch is effective for 72 hours, after which it should be removed.  Wrap patch in a tissue and  discard in the trash. Wash hands thoroughly with soap and water. 2. You may remove the patch earlier than 72 hours if you experience unpleasant side effects which may include dry mouth, dizziness or visual disturbances. 3. Avoid touching the patch. Wash your hands with soap and water after contact with the patch.

## 2016-08-05 NOTE — Anesthesia Preprocedure Evaluation (Addendum)
Anesthesia Evaluation  Patient identified by MRN, date of birth, ID band Patient awake    Reviewed: Allergy & Precautions, H&P , Patient's Chart, lab work & pertinent test results, reviewed documented beta blocker date and time   History of Anesthesia Complications (+) history of anesthetic complications  Airway Mallampati: III  TM Distance: >3 FB Neck ROM: full    Dental no notable dental hx. (+) Teeth Intact, Dental Advisory Given,    Pulmonary asthma , former smoker,    Pulmonary exam normal breath sounds clear to auscultation       Cardiovascular hypertension, Pt. on medications  Rhythm:regular Rate:Normal     Neuro/Psych Anxiety Depression Hx panic attacks   GI/Hepatic GERD  Medicated,  Endo/Other  Obese  Renal/GU    Recurrent Bladder CA    Musculoskeletal  (+) Arthritis , Osteoarthritis,    Abdominal   Peds  Hematology   Anesthesia Other Findings Rare inhaler use  Reproductive/Obstetrics                         Anesthesia Physical Anesthesia Plan  ASA: III  Anesthesia Plan:    Post-op Pain Management:    Induction: Intravenous  Airway Management Planned: LMA  Additional Equipment:   Intra-op Plan:   Post-operative Plan:   Informed Consent: I have reviewed the patients History and Physical, chart, labs and discussed the procedure including the risks, benefits and alternatives for the proposed anesthesia with the patient or authorized representative who has indicated his/her understanding and acceptance.   Dental Advisory Given  Plan Discussed with: CRNA and Surgeon  Anesthesia Plan Comments: (.)       Anesthesia Quick Evaluation
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
## 2016-08-05 NOTE — Brief Op Note (Signed)
08/05/2016  1:22 PM  PATIENT:  Christian [name]  56 y.o. male  PRE-OPERATIVE DIAGNOSIS:  BLADDER CANCER  POST-OPERATIVE DIAGNOSIS:  BLADDER CANCER  PROCEDURE:  Procedure(s): TRANSURETHRAL RESECTION OF BLADDER TUMOR (TURBT) (N/A) CYSTOSCOPY WITH FULGERATION (N/A)  SURGEON:  Surgeon(s) and Role:    * Cleon Gustin, MD - Primary  PHYSICIAN ASSISTANT:   ASSISTANTS: none   ANESTHESIA:   general  EBL:  Total I/O In: -  Out: 5 [Blood:5]  BLOOD ADMINISTERED:none  DRAINS: none   LOCAL MEDICATIONS USED:  NONE  SPECIMEN:  Source of Specimen:  bladder tumors  DISPOSITION OF SPECIMEN:  PATHOLOGY  COUNTS:  YES  TOURNIQUET:  * No tourniquets in log *  DICTATION: .Note written in EPIC  PLAN OF CARE: Discharge to home after PACU  PATIENT DISPOSITION:  PACU - hemodynamically stable.   Delay start of Pharmacological VTE agent (>24hrs) due to surgical blood loss or risk of bleeding: not applicable

## 2016-08-08 ENCOUNTER — Encounter (HOSPITAL_BASED_OUTPATIENT_CLINIC_OR_DEPARTMENT_OTHER): Payer: Self-pay | Admitting: Urology

## 2016-08-08 NOTE — Anesthesia Postprocedure Evaluation (Signed)
Anesthesia Post Note  Patient: Christian [name]  Procedure(s) Performed: Procedure(s) (LRB): TRANSURETHRAL RESECTION OF BLADDER TUMOR (TURBT) (N/A) CYSTOSCOPY WITH FULGERATION (N/A)  Patient location during evaluation: PACU Anesthesia Type: General Level of consciousness: awake and alert Pain management: satisfactory to patient Vital Signs Assessment: post-procedure vital signs reviewed and stable Respiratory status: spontaneous breathing Cardiovascular status: stable Anesthetic complications: no       Last Vitals:  Vitals:   08/05/16 1054 08/05/16 1334  BP: (!) 146/89   Pulse: 66 84  Resp: 18 16  Temp: 36.9 C 36.9 C    Last Pain:  Vitals:   08/05/16 1054  TempSrc: Oral                 Vladislav Axelson EDWARD

## 2016-08-09 NOTE — Op Note (Signed)
Preoperative diagnosis:  Bladder cancer  Postoperative diagnosis: Same  Procedure: 1 cystoscopy 2.  Bladder biopsy with fulgeration  Attending: [name]  Anesthesia: General  Estimated blood loss: Minimal  Drains: none  Specimens:  Bladder biopsies x 3  Antibiotics: ancef  Findings:  Ureteral orifices in normal anatomic location. 3 small papillary bladders at dome left and right lateral wall  Indications: Patient is a 56 year old male with a history of bladder cancer with recurrence on office cystoscopy. After discussing treatment options, they decided proceed with bladder biopsy.  Procedure her in detail: The patient was brought to the operating room and a brief timeout was done to ensure correct patient, correct procedure, correct site. General anesthesia was administered patient was placed in dorsal lithotomy position. Their genitalia was then prepped and draped in usual sterile fashion. A rigid 37 French cystoscope was passed in the urethra and the bladder. Bladder was inspected and we noted 2 scars consistent with previous biopsy. the ureteral orifices were in the normal orthotopic locations.  We then removed the cystoscope and placed a resectoscope into the bladder. We proceeded to obtain multiple biopsies the 3 small papillar bladder tumors Hemostasis was then obtained with a bugbee. the bladder was then drained, aand this concluded the procedure which was well tolerated by patient.  Complications: None  Condition: Stable, extubated, transferred to PACU  Plan: Patient will be discharged home and will followup in 5 days for pathology discussion

## 2016-08-11 DIAGNOSIS — C670 Malignant neoplasm of trigone of bladder: Secondary | ICD-10-CM | POA: Diagnosis not present

## 2016-08-11 DIAGNOSIS — C672 Malignant neoplasm of lateral wall of bladder: Secondary | ICD-10-CM | POA: Diagnosis not present

## 2016-08-29 ENCOUNTER — Other Ambulatory Visit: Payer: Self-pay | Admitting: Internal Medicine

## 2016-09-16 DIAGNOSIS — C670 Malignant neoplasm of trigone of bladder: Secondary | ICD-10-CM | POA: Diagnosis not present

## 2016-09-16 DIAGNOSIS — Z5111 Encounter for antineoplastic chemotherapy: Secondary | ICD-10-CM | POA: Diagnosis not present

## 2016-09-16 DIAGNOSIS — C672 Malignant neoplasm of lateral wall of bladder: Secondary | ICD-10-CM | POA: Diagnosis not present

## 2016-09-20 ENCOUNTER — Encounter: Payer: Self-pay | Admitting: Internal Medicine

## 2016-09-20 ENCOUNTER — Ambulatory Visit (INDEPENDENT_AMBULATORY_CARE_PROVIDER_SITE_OTHER): Payer: 59 | Admitting: Internal Medicine

## 2016-09-20 DIAGNOSIS — Z6839 Body mass index (BMI) 39.0-39.9, adult: Secondary | ICD-10-CM | POA: Diagnosis not present

## 2016-09-20 DIAGNOSIS — F1010 Alcohol abuse, uncomplicated: Secondary | ICD-10-CM

## 2016-09-20 DIAGNOSIS — I10 Essential (primary) hypertension: Secondary | ICD-10-CM | POA: Diagnosis not present

## 2016-09-20 DIAGNOSIS — C670 Malignant neoplasm of trigone of bladder: Secondary | ICD-10-CM

## 2016-09-20 DIAGNOSIS — R0683 Snoring: Secondary | ICD-10-CM | POA: Diagnosis not present

## 2016-09-20 MED ORDER — VALSARTAN 320 MG PO TABS: 320.0000 mg | ORAL_TABLET | Freq: Every day | ORAL | 3 refills | Status: DC

## 2016-09-20 NOTE — Assessment & Plan Note (Signed)
f/u w/Urology

## 2016-09-20 NOTE — Assessment & Plan Note (Signed)
Pt declined a sleep test

## 2016-09-20 NOTE — Progress Notes (Signed)
Subjective:  Patient ID: Christian [name], male    DOB: 03-13-1961  Age: 56 y.o. MRN: [id number]  CC: No chief complaint on file.   HPI SYLER NORCIA presents for HTN, wt gain, snoring f/u  Outpatient Medications Prior to Visit  Medication Sig Dispense Refill  . albuterol (PROAIR HFA) 108 (90 Base) MCG/ACT inhaler Inhale 2 puffs into the lungs every 6 (six) hours as needed for wheezing. For shortness of breath. 8.5 Inhaler 3  . amLODipine (NORVASC) 5 MG tablet TAKE 1 TABLET (5 MG TOTAL) BY MOUTH DAILY. 90 tablet 3  . aspirin EC 325 MG tablet Take 325 mg by mouth daily.    . clonazePAM (KLONOPIN) 1 MG tablet TAKE 1 TABLET TWICE A DAY AS NEEDED FOR ANXIETY 60 tablet 2  . colchicine 0.6 MG tablet Take 1 tablet (0.6 mg total) by mouth daily as needed. 30 tablet 0  . HYDROcodone-acetaminophen (NORCO) 7.5-325 MG tablet Take 1 tablet by mouth every 6 (six) hours as needed for moderate pain or severe pain. 30 tablet 0  . indomethacin (INDOCIN) 50 MG capsule Take 1 capsule (50 mg total) by mouth 3 (three) times daily as needed for moderate pain. 30 capsule 3  . lisinopril (PRINIVIL,ZESTRIL) 20 MG tablet Take 1 tablet (20 mg total) by mouth 2 (two) times daily. 180 tablet 1  . MITIGARE 0.6 MG CAPS TAKE 1 TABLET (0.6 MG TOTAL) BY MOUTH DAILY AS NEEDED. 30 capsule 3  . oxybutynin (DITROPAN) 5 MG tablet Take 1 tablet (5 mg total) by mouth 3 (three) times daily. 30 tablet 0  . ranitidine (ZANTAC) 150 MG tablet Take 150 mg by mouth daily as needed for heartburn.     . sildenafil (VIAGRA) 100 MG tablet TAKE 1 TABLET BY MOUTH AS NEEDED 12 tablet 8   No facility-administered medications prior to visit.     ROS Review of Systems  Constitutional: Positive for unexpected weight change. Negative for appetite change and fatigue.  HENT: Negative for congestion, nosebleeds, sneezing, sore throat and trouble swallowing.   Eyes: Negative for itching and visual disturbance.  Respiratory: Negative for cough.     Cardiovascular: Negative for chest pain, palpitations and leg swelling.  Gastrointestinal: Negative for abdominal distention, blood in stool, diarrhea and nausea.  Genitourinary: Negative for frequency and hematuria.  Musculoskeletal: Negative for back pain, gait problem, joint swelling and neck pain.  Skin: Negative for rash.  Neurological: Negative for dizziness, tremors, speech difficulty and weakness.  Psychiatric/Behavioral: Negative for agitation, dysphoric mood and sleep disturbance. The patient is not nervous/anxious.     Objective:  BP (!) 142/80 (BP Location: Left Arm, Patient Position: Sitting, Cuff Size: Large)   Pulse 77   Temp 98.2 F (36.8 C) (Oral)   Ht 5\' 8"  (1.727 m)   Wt 259 lb 1.9 oz (117.5 kg)   SpO2 99%   BMI 39.40 kg/m   BP Readings from Last 3 Encounters:  09/20/16 (!) 142/80  08/05/16 (!) 146/89  05/19/16 130/82    Wt Readings from Last 3 Encounters:  09/20/16 259 lb 1.9 oz (117.5 kg)  08/05/16 254 lb 8 oz (115.4 kg)  05/19/16 256 lb 1.3 oz (116.2 kg)    Physical Exam  Constitutional: He is oriented to person, place, and time. He appears well-developed. No distress.  NAD  HENT:  Mouth/Throat: Oropharynx is clear and moist.  Eyes: Conjunctivae are normal. Pupils are equal, round, and reactive to light.  Neck: Normal range of motion. No  JVD present. No thyromegaly present.  Cardiovascular: Normal rate, regular rhythm, normal heart sounds and intact distal pulses.  Exam reveals no gallop and no friction rub.   No murmur heard. Pulmonary/Chest: Effort normal and breath sounds normal. No respiratory distress. He has no wheezes. He has no rales. He exhibits no tenderness.  Abdominal: Soft. Bowel sounds are normal. He exhibits no distension and no mass. There is no tenderness. There is no rebound and no guarding.  Musculoskeletal: Normal range of motion. He exhibits no edema or tenderness.  Lymphadenopathy:    He has no cervical adenopathy.   Neurological: He is alert and oriented to person, place, and time. He has normal reflexes. No cranial nerve deficit. He exhibits normal muscle tone. He displays a negative Romberg sign. Coordination and gait normal.  Skin: Skin is warm and dry. No rash noted.  Psychiatric: He has a normal mood and affect. His behavior is normal. Judgment and thought content normal.  Obese  Lab Results  Component Value Date   WBC 8.0 11/24/2015   HGB 12.6 (L) 08/05/2016   HCT 37.0 (L) 08/05/2016   PLT 252.0 11/24/2015   GLUCOSE 99 08/05/2016   CHOL 234 (H) 06/03/2015   TRIG 190.0 (H) 06/03/2015   HDL 39.30 06/03/2015   LDLDIRECT 129.0 11/20/2014   LDLCALC 156 (H) 06/03/2015   ALT 41 06/03/2015   AST 35 06/03/2015   NA 140 08/05/2016   K 4.1 08/05/2016   CL 104 02/22/2016   CREATININE 0.80 02/22/2016   BUN 14 02/22/2016   CO2 28 12/25/2015   TSH 1.10 12/25/2015   PSA 0.75 11/20/2014   INR 1.01 03/28/2012   HGBA1C 5.5 05/13/2014    No results found.  Assessment & Plan:   There are no diagnoses linked to this encounter. I am having Mr. Pucillo maintain his sildenafil, aspirin EC, ranitidine, lisinopril, colchicine, indomethacin, albuterol, clonazePAM, MITIGARE, HYDROcodone-acetaminophen, oxybutynin, and amLODipine.  No orders of the defined types were placed in this encounter.    Follow-up: No Follow-up on file.  Walker Kehr, MD

## 2016-09-20 NOTE — Patient Instructions (Signed)

## 2016-09-20 NOTE — Progress Notes (Signed)
Pre visit review using our clinic review tool, if applicable. No additional management support is needed unless otherwise documented below in the visit note. 

## 2016-09-20 NOTE — Assessment & Plan Note (Signed)
Lisinopril - d/c  Norvasc Added Diovan Declined sleep test

## 2016-09-20 NOTE — Assessment & Plan Note (Signed)
Wt Readings from Last 3 Encounters:  09/20/16 259 lb 1.9 oz (117.5 kg)  08/05/16 254 lb 8 oz (115.4 kg)  05/19/16 256 lb 1.3 oz (116.2 kg)  declined sleep test

## 2016-09-20 NOTE — Assessment & Plan Note (Signed)
1 beer a day now - better

## 2016-09-21 ENCOUNTER — Other Ambulatory Visit: Payer: Self-pay | Admitting: Internal Medicine

## 2016-09-21 ENCOUNTER — Other Ambulatory Visit (INDEPENDENT_AMBULATORY_CARE_PROVIDER_SITE_OTHER): Payer: 59

## 2016-09-21 DIAGNOSIS — R0683 Snoring: Secondary | ICD-10-CM

## 2016-09-21 DIAGNOSIS — I10 Essential (primary) hypertension: Secondary | ICD-10-CM

## 2016-09-21 DIAGNOSIS — R945 Abnormal results of liver function studies: Principal | ICD-10-CM

## 2016-09-21 DIAGNOSIS — R7989 Other specified abnormal findings of blood chemistry: Secondary | ICD-10-CM

## 2016-09-21 LAB — HEPATIC FUNCTION PANEL
ALBUMIN: 4.8 g/dL (ref 3.5–5.2)
ALT: 83 U/L — ABNORMAL HIGH (ref 0–53)
AST: 84 U/L — ABNORMAL HIGH (ref 0–37)
Alkaline Phosphatase: 68 U/L (ref 39–117)
BILIRUBIN DIRECT: 0.2 mg/dL (ref 0.0–0.3)
TOTAL PROTEIN: 7.5 g/dL (ref 6.0–8.3)
Total Bilirubin: 0.9 mg/dL (ref 0.2–1.2)

## 2016-09-21 LAB — CBC WITH DIFFERENTIAL/PLATELET
BASOS PCT: 1.1 % (ref 0.0–3.0)
Basophils Absolute: 0.1 10*3/uL (ref 0.0–0.1)
EOS PCT: 4.1 % (ref 0.0–5.0)
Eosinophils Absolute: 0.3 10*3/uL (ref 0.0–0.7)
HEMATOCRIT: 41 % (ref 39.0–52.0)
HEMOGLOBIN: 14.2 g/dL (ref 13.0–17.0)
LYMPHS PCT: 27.6 % (ref 12.0–46.0)
Lymphs Abs: 2.1 10*3/uL (ref 0.7–4.0)
MCHC: 34.6 g/dL (ref 30.0–36.0)
MCV: 94.9 fl (ref 78.0–100.0)
Monocytes Absolute: 0.5 10*3/uL (ref 0.1–1.0)
Monocytes Relative: 7.2 % (ref 3.0–12.0)
Neutro Abs: 4.5 10*3/uL (ref 1.4–7.7)
Neutrophils Relative %: 60 % (ref 43.0–77.0)
Platelets: 190 10*3/uL (ref 150.0–400.0)
RBC: 4.32 Mil/uL (ref 4.22–5.81)
RDW: 13.1 % (ref 11.5–15.5)
WBC: 7.6 10*3/uL (ref 4.0–10.5)

## 2016-09-21 LAB — BASIC METABOLIC PANEL
BUN: 23 mg/dL (ref 6–23)
CHLORIDE: 103 meq/L (ref 96–112)
CO2: 25 mEq/L (ref 19–32)
Calcium: 10 mg/dL (ref 8.4–10.5)
Creatinine, Ser: 0.99 mg/dL (ref 0.40–1.50)
GFR: 83.77 mL/min (ref >60.00)
Glucose, Bld: 99 mg/dL (ref 70–99)
POTASSIUM: 4.2 meq/L (ref 3.5–5.1)
Sodium: 138 mEq/L (ref 135–145)

## 2016-09-21 LAB — TSH: TSH: 1.09 u[IU]/mL (ref 0.35–4.50)

## 2016-09-22 ENCOUNTER — Other Ambulatory Visit: Payer: Self-pay | Admitting: Internal Medicine

## 2016-09-22 NOTE — Telephone Encounter (Signed)
Pt wife called in and said that he info that was given to husband , he got confused about it.  She needs to know what was told to him and has as some questions   besrt [phone]

## 2016-09-22 NOTE — Telephone Encounter (Signed)
Pts wife notified about imaging referral. While on the phone she stated patient forgot to get a refill on Klonopin and Hydrocodone. Please advise on medication.   RX loaded to be printed if okay.

## 2016-09-23 DIAGNOSIS — Z5111 Encounter for antineoplastic chemotherapy: Secondary | ICD-10-CM | POA: Diagnosis not present

## 2016-09-23 DIAGNOSIS — C670 Malignant neoplasm of trigone of bladder: Secondary | ICD-10-CM | POA: Diagnosis not present

## 2016-09-27 MED ORDER — CLONAZEPAM 1 MG PO TABS: ORAL_TABLET | ORAL | 2 refills | Status: DC

## 2016-09-27 MED ORDER — HYDROCODONE-ACETAMINOPHEN 7.5-325 MG PO TABS: 1.0000 | ORAL_TABLET | Freq: Four times a day (QID) | ORAL | 0 refills | Status: DC | PRN

## 2016-09-28 NOTE — Telephone Encounter (Signed)
Notified pt wife rx's ready for pick-up. Wife also stated they have not heard from the [place]. Per [facility] should have contacted them gave wife [facility].Marland KitchenJohny Chess

## 2016-09-30 ENCOUNTER — Ambulatory Visit
Admission: RE | Admit: 2016-09-30 | Discharge: 2016-09-30 | Disposition: A | Discharge status: Home / Self Care | Payer: 59 | Source: Ambulatory Visit | Attending: Internal Medicine | Admitting: Internal Medicine

## 2016-09-30 DIAGNOSIS — C670 Malignant neoplasm of trigone of bladder: Secondary | ICD-10-CM | POA: Diagnosis not present

## 2016-09-30 DIAGNOSIS — C672 Malignant neoplasm of lateral wall of bladder: Secondary | ICD-10-CM | POA: Diagnosis not present

## 2016-09-30 DIAGNOSIS — R945 Abnormal results of liver function studies: Principal | ICD-10-CM

## 2016-09-30 DIAGNOSIS — R7989 Other specified abnormal findings of blood chemistry: Secondary | ICD-10-CM

## 2016-09-30 DIAGNOSIS — Z5111 Encounter for antineoplastic chemotherapy: Secondary | ICD-10-CM | POA: Diagnosis not present

## 2016-10-12 ENCOUNTER — Telehealth: Payer: Self-pay | Admitting: *Deleted

## 2016-10-12 NOTE — Telephone Encounter (Signed)
Left msg on triage stating insurance is requiring [place] to notify PCP & document that they are aware that the pt is taking (2) opioids together. Pt is currently taking c,lonazepam and Norco 7.5/325 mg. Is this ok...Christian [name]

## 2016-10-13 NOTE — Telephone Encounter (Signed)
He was instructed not to take them together Thx

## 2016-10-13 NOTE — Telephone Encounter (Signed)
Called pharmacy spoke w/[facility] gave [facility] response. [name] stated pt had got upset after they had told him they had to get the ok from MD to fill he ended up taking the rx back, but will document info on his file...Christian [name]

## 2016-11-17 DIAGNOSIS — C672 Malignant neoplasm of lateral wall of bladder: Secondary | ICD-10-CM | POA: Diagnosis not present

## 2016-11-17 DIAGNOSIS — C670 Malignant neoplasm of trigone of bladder: Secondary | ICD-10-CM | POA: Diagnosis not present

## 2016-11-25 NOTE — Addendum Note (Signed)
Addendum  created 11/25/16 1405 by Elizebeth Kluesner, MD   Sign clinical note    

## 2016-11-25 NOTE — Anesthesia Postprocedure Evaluation (Signed)
Anesthesia Post Note  Patient: Christian [name]  Procedure(s) Performed: Procedure(s) (LRB): TRANSURETHRAL RESECTION OF BLADDER TUMOR (TURBT) (N/A) CYSTOSCOPY WITH FULGERATION (N/A)     Anesthesia Post Evaluation  Last Vitals:  Vitals:   08/05/16 1054 08/05/16 1334  BP: (!) 146/89   Pulse: 66 84  Resp: 18 16  Temp: 36.9 C 36.9 C    Last Pain:  Vitals:   08/05/16 1054  TempSrc: Oral                 Ranya Fiddler EDWARD

## 2017-01-24 ENCOUNTER — Other Ambulatory Visit (INDEPENDENT_AMBULATORY_CARE_PROVIDER_SITE_OTHER): Payer: 59

## 2017-01-24 ENCOUNTER — Ambulatory Visit (INDEPENDENT_AMBULATORY_CARE_PROVIDER_SITE_OTHER): Payer: 59 | Admitting: Internal Medicine

## 2017-01-24 ENCOUNTER — Encounter: Payer: Self-pay | Admitting: Internal Medicine

## 2017-01-24 VITALS — BP 180/98 | HR 66 | Temp 98.7°F | Ht 68.0 in | Wt 249.0 lb

## 2017-01-24 DIAGNOSIS — R945 Abnormal results of liver function studies: Secondary | ICD-10-CM

## 2017-01-24 DIAGNOSIS — C670 Malignant neoplasm of trigone of bladder: Secondary | ICD-10-CM

## 2017-01-24 DIAGNOSIS — F329 Major depressive disorder, single episode, unspecified: Secondary | ICD-10-CM | POA: Diagnosis not present

## 2017-01-24 DIAGNOSIS — R7989 Other specified abnormal findings of blood chemistry: Secondary | ICD-10-CM

## 2017-01-24 DIAGNOSIS — R05 Cough: Secondary | ICD-10-CM

## 2017-01-24 DIAGNOSIS — R059 Cough, unspecified: Secondary | ICD-10-CM

## 2017-01-24 DIAGNOSIS — I10 Essential (primary) hypertension: Secondary | ICD-10-CM

## 2017-01-24 LAB — HEPATIC FUNCTION PANEL
ALK PHOS: 71 U/L (ref 39–117)
ALT: 48 U/L (ref 0–53)
AST: 46 U/L — ABNORMAL HIGH (ref 0–37)
Albumin: 4.5 g/dL (ref 3.5–5.2)
BILIRUBIN DIRECT: 0.1 mg/dL (ref 0.0–0.3)
BILIRUBIN TOTAL: 0.5 mg/dL (ref 0.2–1.2)
Total Protein: 7.3 g/dL (ref 6.0–8.3)

## 2017-01-24 LAB — FERRITIN: Ferritin: 252.9 ng/mL (ref 22.0–322.0)

## 2017-01-24 LAB — BASIC METABOLIC PANEL
BUN: 17 mg/dL (ref 6–23)
CO2: 26 meq/L (ref 19–32)
CREATININE: 0.89 mg/dL (ref 0.40–1.50)
Calcium: 10.5 mg/dL (ref 8.4–10.5)
Chloride: 102 mEq/L (ref 96–112)
GFR: 94.6 mL/min (ref >60.00)
Glucose, Bld: 104 mg/dL — ABNORMAL HIGH (ref 70–99)
Potassium: 5.1 mEq/L (ref 3.5–5.1)
SODIUM: 139 meq/L (ref 135–145)

## 2017-01-24 MED ORDER — IRBESARTAN 300 MG PO TABS: 300.0000 mg | ORAL_TABLET | Freq: Every day | ORAL | 3 refills | Status: DC

## 2017-01-24 MED ORDER — AMLODIPINE BESYLATE 5 MG PO TABS: 5.0000 mg | ORAL_TABLET | Freq: Every day | ORAL | 3 refills | Status: DC

## 2017-01-24 NOTE — Assessment & Plan Note (Signed)
Better  

## 2017-01-24 NOTE — Assessment & Plan Note (Signed)
Liver US ok Labs Loose wt

## 2017-01-24 NOTE — Assessment & Plan Note (Signed)
F/u w/Urology 

## 2017-01-24 NOTE — Addendum Note (Signed)
Addended by: Sallye Ober on: 01/24/2017 08:47 AM   Modules accepted: Orders

## 2017-01-24 NOTE — Assessment & Plan Note (Signed)
D/c Lisinopril 

## 2017-01-24 NOTE — Assessment & Plan Note (Addendum)
Added Irbesartan d/c Diovan, Lisinopril Re-start Amlodipine

## 2017-01-24 NOTE — Progress Notes (Signed)
Subjective:  Patient ID: Christian [name], male    DOB: 28-Sep-1960  Age: 56 y.o. MRN: [id number]  CC: No chief complaint on file.   HPI Christian [name] presents for asthma like sx's after cutting grass - coughing - back on Lisinopril. Stopped Valsartan. Not taking Norvasc... F/u bladder ca, HTN  Outpatient Medications Prior to Visit  Medication Sig Dispense Refill  . albuterol (PROAIR HFA) 108 (90 Base) MCG/ACT inhaler Inhale 2 puffs into the lungs every 6 (six) hours as needed for wheezing. For shortness of breath. 8.5 Inhaler 3  . amLODipine (NORVASC) 5 MG tablet TAKE 1 TABLET (5 MG TOTAL) BY MOUTH DAILY. 90 tablet 3  . aspirin EC 325 MG tablet Take 325 mg by mouth daily.    . clonazePAM (KLONOPIN) 1 MG tablet TAKE 1 TABLET TWICE A DAY AS NEEDED FOR ANXIETY 60 tablet 2  . colchicine 0.6 MG tablet Take 1 tablet (0.6 mg total) by mouth daily as needed. 30 tablet 0  . HYDROcodone-acetaminophen (NORCO) 7.5-325 MG tablet Take 1 tablet by mouth every 6 (six) hours as needed for moderate pain or severe pain. 60 tablet 0  . indomethacin (INDOCIN) 50 MG capsule Take 1 capsule (50 mg total) by mouth 3 (three) times daily as needed for moderate pain. 30 capsule 3  . MITIGARE 0.6 MG CAPS TAKE 1 TABLET (0.6 MG TOTAL) BY MOUTH DAILY AS NEEDED. 30 capsule 3  . oxybutynin (DITROPAN) 5 MG tablet Take 1 tablet (5 mg total) by mouth 3 (three) times daily. 30 tablet 0  . ranitidine (ZANTAC) 150 MG tablet Take 150 mg by mouth daily as needed for heartburn.     . sildenafil (VIAGRA) 100 MG tablet TAKE 1 TABLET BY MOUTH AS NEEDED 12 tablet 8  . valsartan (DIOVAN) 320 MG tablet Take 1 tablet (320 mg total) by mouth daily. 90 tablet 3   No facility-administered medications prior to visit.     ROS Review of Systems  Constitutional: Negative for appetite change, fatigue and unexpected weight change.  HENT: Negative for congestion, nosebleeds, sneezing, sore throat and trouble swallowing.   Eyes: Negative for  itching and visual disturbance.  Respiratory: Positive for cough and wheezing.   Cardiovascular: Negative for chest pain, palpitations and leg swelling.  Gastrointestinal: Negative for abdominal distention, blood in stool, diarrhea and nausea.  Genitourinary: Negative for frequency and hematuria.  Musculoskeletal: Negative for back pain, gait problem, joint swelling and neck pain.  Skin: Negative for rash.  Neurological: Negative for dizziness, tremors, speech difficulty and weakness.  Psychiatric/Behavioral: Negative for agitation, dysphoric mood and sleep disturbance. The patient is not nervous/anxious.     Objective:  BP (!) 180/98 (BP Location: Left Arm, Patient Position: Sitting, Cuff Size: Large)   Pulse 66   Temp 98.7 F (37.1 C) (Oral)   Ht 5\' 8"  (1.727 m)   Wt 249 lb (112.9 kg)   SpO2 99%   BMI 37.86 kg/m   BP Readings from Last 3 Encounters:  01/24/17 (!) 180/98  09/20/16 (!) 142/80  08/05/16 (!) 146/89    Wt Readings from Last 3 Encounters:  01/24/17 249 lb (112.9 kg)  09/20/16 259 lb 1.9 oz (117.5 kg)  08/05/16 254 lb 8 oz (115.4 kg)    Physical Exam  Constitutional: He is oriented to person, place, and time. He appears well-developed. No distress.  NAD  HENT:  Mouth/Throat: Oropharynx is clear and moist.  Eyes: Pupils are equal, round, and reactive to light. Conjunctivae  are normal.  Neck: Normal range of motion. No JVD present. No thyromegaly present.  Cardiovascular: Normal rate, regular rhythm, normal heart sounds and intact distal pulses.  Exam reveals no gallop and no friction rub.   No murmur heard. Pulmonary/Chest: Effort normal and breath sounds normal. No respiratory distress. He has no wheezes. He has no rales. He exhibits no tenderness.  Abdominal: Soft. Bowel sounds are normal. He exhibits no distension and no mass. There is no tenderness. There is no rebound and no guarding.  Musculoskeletal: Normal range of motion. He exhibits no edema or  tenderness.  Lymphadenopathy:    He has no cervical adenopathy.  Neurological: He is alert and oriented to person, place, and time. He has normal reflexes. No cranial nerve deficit. He exhibits normal muscle tone. He displays a negative Romberg sign. Coordination and gait normal.  Skin: Skin is warm and dry. No rash noted.  Psychiatric: He has a normal mood and affect. His behavior is normal. Judgment and thought content normal.  obese  Lab Results  Component Value Date   WBC 7.6 09/21/2016   HGB 14.2 09/21/2016   HCT 41.0 09/21/2016   PLT 190.0 09/21/2016   GLUCOSE 99 09/21/2016   CHOL 234 (H) 06/03/2015   TRIG 190.0 (H) 06/03/2015   HDL 39.30 06/03/2015   LDLDIRECT 129.0 11/20/2014   LDLCALC 156 (H) 06/03/2015   ALT 83 (H) 09/21/2016   AST 84 (H) 09/21/2016   NA 138 09/21/2016   K 4.2 09/21/2016   CL 103 09/21/2016   CREATININE 0.99 09/21/2016   BUN 23 09/21/2016   CO2 25 09/21/2016   TSH 1.09 09/21/2016   PSA 0.75 11/20/2014   INR 1.01 03/28/2012   HGBA1C 5.5 05/13/2014    US Abdomen Limited  Result Date: 09/30/2016 CLINICAL DATA:  Elevated liver function tests. EXAM: US ABDOMEN LIMITED - RIGHT UPPER QUADRANT COMPARISON:  CT scan of August 17, 2015. FINDINGS: Gallbladder: No gallstones or wall thickening visualized. No sonographic Murphy sign noted by sonographer. Common bile duct: Diameter: 4 pole 1 mm which is within normal limits. Liver: Increased echogenicity of hepatic parenchyma is noted suggesting fatty infiltration or other diffuse hepatocellular disease. No focal hepatic lesion is noted. 3 cm right adrenal adenoma is again noted. IMPRESSION: 3 cm right adrenal adenoma is noted. Increased echogenicity of hepatic parenchyma is noted suggesting fatty infiltration or other diffuse hepatocellular disease. No other abnormality seen in the right upper quadrant of the abdomen. Electronically Signed   By: Marijo Conception, M.D.   On: 09/30/2016 09:08    Assessment & Plan:    There are no diagnoses linked to this encounter. I have discontinued Mr. Boutelle valsartan. I am also having him maintain his sildenafil, aspirin EC, ranitidine, colchicine, indomethacin, albuterol, MITIGARE, oxybutynin, amLODipine, clonazePAM, HYDROcodone-acetaminophen, and lisinopril.  Meds ordered this encounter  Medications  . lisinopril (PRINIVIL,ZESTRIL) 20 MG tablet    Sig: Take 20 mg by mouth 2 (two) times daily. 1 tab AM, 2 tabs PM     Follow-up: No Follow-up on file.  Walker Kehr, MD

## 2017-01-25 LAB — HEPATITIS C ANTIBODY
HEP C AB: NONREACTIVE
SIGNAL TO CUT-OFF: 0.01 (ref <1.00)

## 2017-01-25 LAB — HEPATITIS B SURFACE ANTIGEN: Hepatitis B Surface Ag: NONREACTIVE

## 2017-02-17 DIAGNOSIS — C670 Malignant neoplasm of trigone of bladder: Secondary | ICD-10-CM | POA: Diagnosis not present

## 2017-02-17 DIAGNOSIS — C672 Malignant neoplasm of lateral wall of bladder: Secondary | ICD-10-CM | POA: Diagnosis not present

## 2017-02-22 ENCOUNTER — Other Ambulatory Visit: Payer: Self-pay | Admitting: Urology

## 2017-03-07 ENCOUNTER — Other Ambulatory Visit: Payer: Self-pay | Admitting: Internal Medicine

## 2017-03-07 NOTE — Telephone Encounter (Signed)
Pt's wife called asking to speak with you about a refill that the pt is needing.[name] She would not go into detail with me.

## 2017-03-08 NOTE — Telephone Encounter (Signed)
Claire's cell # [phone]   Would like a refill due to pain in his legs. Wife would like to pick up script tomorrow if possible. Please advise.

## 2017-03-08 NOTE — Telephone Encounter (Signed)
Called pt's wife, VM is full and can not receive any additional msgs.

## 2017-03-09 ENCOUNTER — Encounter (HOSPITAL_BASED_OUTPATIENT_CLINIC_OR_DEPARTMENT_OTHER): Payer: Self-pay | Admitting: *Deleted

## 2017-03-09 NOTE — Progress Notes (Signed)
SPOKE W/ PT WIFE.  NPO AFTER MN W/ EXCEPTION CLEAR LIQUIDS UNTIL 0600 (NO CREAM/ MILK PRODUCTS).  ARRIVE AT 1000.  NEEDS ISTAT 8 AND EKG.  WILL TAKE AVAPRO AND ZANTAC AM DOS W/ SIPS OF WATER.

## 2017-03-14 MED ORDER — HYDROCODONE-ACETAMINOPHEN 7.5-325 MG PO TABS: 1.0000 | ORAL_TABLET | Freq: Four times a day (QID) | ORAL | 0 refills | Status: DC | PRN

## 2017-03-15 NOTE — Telephone Encounter (Addendum)
Tried calling wife # no answer & can't leave msg due to vm being full. Called pt # no answer LMOM rx ready for pick-up...Johny Chess

## 2017-03-17 ENCOUNTER — Ambulatory Visit (HOSPITAL_BASED_OUTPATIENT_CLINIC_OR_DEPARTMENT_OTHER)
Admission: RE | Admit: 2017-03-17 | Discharge: 2017-03-17 | Disposition: A | Discharge status: Home / Self Care | Payer: 59 | Source: Ambulatory Visit | Attending: Urology | Admitting: Urology

## 2017-03-17 ENCOUNTER — Other Ambulatory Visit: Payer: Self-pay

## 2017-03-17 ENCOUNTER — Encounter (HOSPITAL_BASED_OUTPATIENT_CLINIC_OR_DEPARTMENT_OTHER): Payer: Self-pay | Admitting: Anesthesiology

## 2017-03-17 ENCOUNTER — Ambulatory Visit (HOSPITAL_BASED_OUTPATIENT_CLINIC_OR_DEPARTMENT_OTHER): Payer: 59 | Admitting: Anesthesiology

## 2017-03-17 ENCOUNTER — Encounter (HOSPITAL_BASED_OUTPATIENT_CLINIC_OR_DEPARTMENT_OTHER)
Admission: RE | Disposition: A | Discharge status: Home / Self Care | Payer: Self-pay | Source: Ambulatory Visit | Attending: Urology

## 2017-03-17 DIAGNOSIS — Z79899 Other long term (current) drug therapy: Secondary | ICD-10-CM | POA: Diagnosis not present

## 2017-03-17 DIAGNOSIS — I739 Peripheral vascular disease, unspecified: Secondary | ICD-10-CM | POA: Insufficient documentation

## 2017-03-17 DIAGNOSIS — K219 Gastro-esophageal reflux disease without esophagitis: Secondary | ICD-10-CM | POA: Insufficient documentation

## 2017-03-17 DIAGNOSIS — Z87891 Personal history of nicotine dependence: Secondary | ICD-10-CM | POA: Insufficient documentation

## 2017-03-17 DIAGNOSIS — C679 Malignant neoplasm of bladder, unspecified: Secondary | ICD-10-CM | POA: Insufficient documentation

## 2017-03-17 DIAGNOSIS — I10 Essential (primary) hypertension: Secondary | ICD-10-CM | POA: Diagnosis not present

## 2017-03-17 DIAGNOSIS — F329 Major depressive disorder, single episode, unspecified: Secondary | ICD-10-CM | POA: Diagnosis not present

## 2017-03-17 DIAGNOSIS — J45909 Unspecified asthma, uncomplicated: Secondary | ICD-10-CM | POA: Diagnosis not present

## 2017-03-17 DIAGNOSIS — E785 Hyperlipidemia, unspecified: Secondary | ICD-10-CM | POA: Diagnosis not present

## 2017-03-17 DIAGNOSIS — M1990 Unspecified osteoarthritis, unspecified site: Secondary | ICD-10-CM | POA: Insufficient documentation

## 2017-03-17 DIAGNOSIS — Z6836 Body mass index (BMI) 36.0-36.9, adult: Secondary | ICD-10-CM | POA: Diagnosis not present

## 2017-03-17 DIAGNOSIS — Z888 Allergy status to other drugs, medicaments and biological substances status: Secondary | ICD-10-CM | POA: Diagnosis not present

## 2017-03-17 DIAGNOSIS — I452 Bifascicular block: Secondary | ICD-10-CM | POA: Diagnosis not present

## 2017-03-17 DIAGNOSIS — N529 Male erectile dysfunction, unspecified: Secondary | ICD-10-CM | POA: Diagnosis not present

## 2017-03-17 DIAGNOSIS — M109 Gout, unspecified: Secondary | ICD-10-CM | POA: Insufficient documentation

## 2017-03-17 DIAGNOSIS — Z7982 Long term (current) use of aspirin: Secondary | ICD-10-CM | POA: Insufficient documentation

## 2017-03-17 DIAGNOSIS — F419 Anxiety disorder, unspecified: Secondary | ICD-10-CM | POA: Diagnosis not present

## 2017-03-17 DIAGNOSIS — D494 Neoplasm of unspecified behavior of bladder: Secondary | ICD-10-CM | POA: Diagnosis not present

## 2017-03-17 DIAGNOSIS — I440 Atrioventricular block, first degree: Secondary | ICD-10-CM | POA: Insufficient documentation

## 2017-03-17 HISTORY — PX: TRANSURETHRAL RESECTION OF BLADDER TUMOR: SHX2575

## 2017-03-17 LAB — POCT I-STAT, CHEM 8
BUN: 12 mg/dL (ref 6–20)
CREATININE: 0.7 mg/dL (ref 0.61–1.24)
Calcium, Ion: 1.26 mmol/L (ref 1.15–1.40)
Chloride: 104 mmol/L (ref 101–111)
Glucose, Bld: 99 mg/dL (ref 65–99)
HEMATOCRIT: 38 % — AB (ref 39.0–52.0)
HEMOGLOBIN: 12.9 g/dL — AB (ref 13.0–17.0)
Potassium: 4.1 mmol/L (ref 3.5–5.1)
Sodium: 141 mmol/L (ref 135–145)
TCO2: 26 mmol/L (ref 22–32)

## 2017-03-17 SURGERY — TURBT (TRANSURETHRAL RESECTION OF BLADDER TUMOR)
Anesthesia: General | Site: Bladder

## 2017-03-17 MED ORDER — DEXAMETHASONE SODIUM PHOSPHATE 10 MG/ML IJ SOLN
INTRAMUSCULAR | Status: AC
  Filled 2017-03-17: qty 1

## 2017-03-17 MED ORDER — FENTANYL CITRATE (PF) 100 MCG/2ML IJ SOLN
INTRAMUSCULAR | Status: AC
  Filled 2017-03-17: qty 2

## 2017-03-17 MED ORDER — ONDANSETRON HCL 4 MG/2ML IJ SOLN
INTRAMUSCULAR | Status: DC | PRN
  Administered 2017-03-17: 4 mg via INTRAVENOUS

## 2017-03-17 MED ORDER — KETOROLAC TROMETHAMINE 30 MG/ML IJ SOLN
INTRAMUSCULAR | Status: AC
  Filled 2017-03-17: qty 1

## 2017-03-17 MED ORDER — LIDOCAINE 2% (20 MG/ML) 5 ML SYRINGE
INTRAMUSCULAR | Status: AC
  Filled 2017-03-17: qty 5

## 2017-03-17 MED ORDER — ONDANSETRON HCL 4 MG/2ML IJ SOLN
INTRAMUSCULAR | Status: AC
  Filled 2017-03-17: qty 2

## 2017-03-17 MED ORDER — HYDROCODONE-ACETAMINOPHEN 7.5-325 MG PO TABS: 1.0000 | ORAL_TABLET | Freq: Four times a day (QID) | ORAL | 0 refills | Status: DC | PRN

## 2017-03-17 MED ORDER — LACTATED RINGERS IV SOLN
INTRAVENOUS | Status: DC
  Administered 2017-03-17: 12:00:00 via INTRAVENOUS
  Filled 2017-03-17: qty 1000

## 2017-03-17 MED ORDER — SODIUM CHLORIDE 0.9 % IR SOLN
Status: DC | PRN
  Administered 2017-03-17: 3000 mL via INTRAVESICAL

## 2017-03-17 MED ORDER — FENTANYL CITRATE (PF) 100 MCG/2ML IJ SOLN
25.0000 ug | INTRAMUSCULAR | Status: DC | PRN
  Filled 2017-03-17: qty 1

## 2017-03-17 MED ORDER — PROPOFOL 10 MG/ML IV BOLUS
INTRAVENOUS | Status: AC
  Filled 2017-03-17: qty 40

## 2017-03-17 MED ORDER — CEFAZOLIN SODIUM-DEXTROSE 2-4 GM/100ML-% IV SOLN
2.0000 g | INTRAVENOUS | Status: AC
Start: 2017-03-17 — End: 2017-03-17
  Administered 2017-03-17: 2 g via INTRAVENOUS
  Filled 2017-03-17: qty 100

## 2017-03-17 MED ORDER — OXYCODONE HCL 5 MG/5ML PO SOLN
5.0000 mg | Freq: Once | ORAL | Status: DC | PRN
  Filled 2017-03-17: qty 5

## 2017-03-17 MED ORDER — LIDOCAINE 2% (20 MG/ML) 5 ML SYRINGE
INTRAMUSCULAR | Status: DC | PRN
  Administered 2017-03-17: 100 mg via INTRAVENOUS

## 2017-03-17 MED ORDER — FENTANYL CITRATE (PF) 100 MCG/2ML IJ SOLN
INTRAMUSCULAR | Status: DC | PRN
  Administered 2017-03-17 (×2): 25 ug via INTRAVENOUS
  Administered 2017-03-17: 75 ug via INTRAVENOUS
  Administered 2017-03-17 (×3): 25 ug via INTRAVENOUS

## 2017-03-17 MED ORDER — ACETAMINOPHEN 325 MG PO TABS
325.0000 mg | ORAL_TABLET | ORAL | Status: DC | PRN
  Filled 2017-03-17: qty 2

## 2017-03-17 MED ORDER — KETOROLAC TROMETHAMINE 30 MG/ML IJ SOLN
INTRAMUSCULAR | Status: DC | PRN
  Administered 2017-03-17: 30 mg via INTRAVENOUS

## 2017-03-17 MED ORDER — ACETAMINOPHEN 160 MG/5ML PO SOLN
325.0000 mg | ORAL | Status: DC | PRN
  Filled 2017-03-17: qty 20.3

## 2017-03-17 MED ORDER — DEXAMETHASONE SODIUM PHOSPHATE 4 MG/ML IJ SOLN
INTRAMUSCULAR | Status: DC | PRN
  Administered 2017-03-17: 10 mg via INTRAVENOUS

## 2017-03-17 MED ORDER — PROPOFOL 10 MG/ML IV BOLUS
INTRAVENOUS | Status: DC | PRN
  Administered 2017-03-17: 200 mg via INTRAVENOUS

## 2017-03-17 MED ORDER — OXYCODONE HCL 5 MG PO TABS
5.0000 mg | ORAL_TABLET | Freq: Once | ORAL | Status: DC | PRN
  Filled 2017-03-17: qty 1

## 2017-03-17 MED ORDER — CEFAZOLIN SODIUM-DEXTROSE 2-4 GM/100ML-% IV SOLN
INTRAVENOUS | Status: AC
  Filled 2017-03-17: qty 100

## 2017-03-17 MED ORDER — MIDAZOLAM HCL 2 MG/2ML IJ SOLN
INTRAMUSCULAR | Status: AC
  Filled 2017-03-17: qty 2

## 2017-03-17 MED ORDER — MIDAZOLAM HCL 5 MG/5ML IJ SOLN
INTRAMUSCULAR | Status: DC | PRN
  Administered 2017-03-17: 2 mg via INTRAVENOUS

## 2017-03-17 SURGICAL SUPPLY — 23 items
BAG DRAIN URO-CYSTO SKYTR STRL (DRAIN) ×3 IMPLANT
BAG DRN ANRFLXCHMBR STRAP LEK (BAG)
BAG DRN UROCATH (DRAIN) ×1
BAG URINE DRAINAGE (UROLOGICAL SUPPLIES) IMPLANT
BAG URINE LEG 19OZ MD ST LTX (BAG) IMPLANT
CATH FOLEY 3WAY 30CC 22F (CATHETERS) ×1 IMPLANT
CLOTH BEACON ORANGE TIMEOUT ST (SAFETY) ×3 IMPLANT
ELECT REM PT RETURN 9FT ADLT (ELECTROSURGICAL) ×3
ELECTRODE REM PT RTRN 9FT ADLT (ELECTROSURGICAL) ×1 IMPLANT
EVACUATOR MICROVAS BLADDER (UROLOGICAL SUPPLIES) IMPLANT
GLOVE BIO SURGEON STRL SZ8 (GLOVE) ×3 IMPLANT
GOWN STRL REUS W/TWL LRG LVL3 (GOWN DISPOSABLE) ×3 IMPLANT
GOWN STRL REUS W/TWL XL LVL3 (GOWN DISPOSABLE) ×3 IMPLANT
IV NS IRRIG 3000ML ARTHROMATIC (IV SOLUTION) ×4 IMPLANT
KIT RM TURNOVER CYSTO AR (KITS) ×3 IMPLANT
LOOP CUT BIPOLAR 24F LRG (ELECTROSURGICAL) ×3 IMPLANT
MANIFOLD NEPTUNE II (INSTRUMENTS) IMPLANT
PACK CYSTO (CUSTOM PROCEDURE TRAY) ×3 IMPLANT
PLUG CATH AND CAP STER (CATHETERS) IMPLANT
SYR 30ML LL (SYRINGE) ×3 IMPLANT
SYRINGE IRR TOOMEY STRL 70CC (SYRINGE) IMPLANT
TUBE CONNECTING 12'X1/4 (SUCTIONS)
TUBE CONNECTING 12X1/4 (SUCTIONS) IMPLANT
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
## 2017-03-17 NOTE — Discharge Instructions (Signed)
Bladder Biopsy, Care After Refer to this sheet in the next few weeks. These instructions provide you with information about caring for yourself after your procedure. Your health care provider may also give you more specific instructions. Your treatment has been planned according to current medical practices, but problems sometimes occur. Call your health care provider if you have any problems or questions after your procedure. What can I expect after the procedure? After the procedure, it is common to have:  Mild pain in your bladder or kidney area during urination.  Minor burning during urination.  Small amounts of blood in your urine.  A sudden urge to urinate.  A need to urinate more often than usual.  Follow these instructions at home: Medicines  Take over-the-counter and prescription medicines only as told by your health care provider.  If you were prescribed an antibiotic medicine, take it as told by your health care provider. Do not stop taking the antibiotic even if you start to feel better. General instructions   Take a warm bath to relieve any burning sensations around your urethra.  Hold a warm, damp washcloth over the urethral area to ease pain.  Return to your normal activities as told by your health care provider. Ask your health care provider what activities are safe for you.  Do not drive for 24 hours if you received a medicine to help you relax (sedative) during your procedure. Ask your health care provider when it is safe for you to drive.  It is your responsibility to get the results of your procedure. Ask your health care provider or the department performing the procedure when your results will be ready.  Keep all follow-up visits as told by your health care provider. This is important. Contact a health care provider if:  You have a fever.  Your symptoms do not improve within 24 hours and you continue to have: ? Burning during urination. ? Increasing  amounts of blood in your urine. ? Pain during urination. ? An urgent need to urinate. ? A need to urinate more often than usual. Get help right away if:  You have a lot of bleeding or more bleeding.  You have severe pain.  You are unable to urinate.  You have bright red blood in your urine.  You are passing blood clots in your urine.  You have a fever.  You have swelling, redness, or pain in your legs.  You have difficulty breathing. This information is not intended to replace advice given to you by your health care provider. Make sure you discuss any questions you have with your health care provider. Document Released: 05/19/2015 Document Revised: 10/08/2015 Document Reviewed: 05/19/2015 Elsevier Interactive Patient Education  2018 [place] [facility] Instructions  Activity: Get plenty of rest for the remainder of the day. A responsible individual must stay with you for 24 hours following the procedure.  For the next 24 hours, DO NOT: -Drive a car -Paediatric nurse -Drink alcoholic beverages -Take any medication unless instructed by your physician -Make any legal decisions or sign important papers.  Meals: Start with liquid foods such as gelatin or soup. Progress to regular foods as tolerated. Avoid greasy, spicy, heavy foods. If nausea and/or vomiting occur, drink only clear liquids until the nausea and/or vomiting subsides. Call your physician if vomiting continues.  Special Instructions/Symptoms: Your throat may feel dry or sore from the anesthesia or the breathing tube placed in your throat during surgery. If this causes  discomfort, gargle with warm salt water. The discomfort should disappear within 24 hours.  If you had a scopolamine patch placed behind your ear for the management of post- operative nausea and/or vomiting:  1. The medication in the patch is effective for 72 hours, after which it should be removed.  Wrap patch in a tissue and  discard in the trash. Wash hands thoroughly with soap and water. 2. You may remove the patch earlier than 72 hours if you experience unpleasant side effects which may include dry mouth, dizziness or visual disturbances. 3. Avoid touching the patch. Wash your hands with soap and water after contact with the patch.

## 2017-03-17 NOTE — Anesthesia Preprocedure Evaluation (Signed)
Anesthesia Evaluation  Patient identified by MRN, date of birth, ID band Patient awake    Reviewed: Allergy & Precautions, NPO status , Patient's Chart, lab work & pertinent test results  History of Anesthesia Complications Negative for: history of anesthetic complications  Airway Mallampati: III  TM Distance: >3 FB Neck ROM: Full    Dental  (+) Teeth Intact   Pulmonary neg shortness of breath, asthma , neg sleep apnea, neg recent URI, former smoker,    breath sounds clear to auscultation       Cardiovascular hypertension, Pt. on medications (-) angina+ Peripheral Vascular Disease  (-) Past MI and (-) CHF  Rhythm:Regular     Neuro/Psych PSYCHIATRIC DISORDERS Anxiety Depression negative neurological ROS     GI/Hepatic Neg liver ROS, GERD  Medicated and Controlled,  Endo/Other  Morbid obesity  Renal/GU negative Renal ROS     Musculoskeletal  (+) Arthritis ,   Abdominal   Peds  Hematology negative hematology ROS (+)   Anesthesia Other Findings   Reproductive/Obstetrics                             Anesthesia Physical Anesthesia Plan  ASA: II  Anesthesia Plan: General   Post-op Pain Management:    Induction: Intravenous  PONV Risk Score and Plan: 2 and Ondansetron and Dexamethasone  Airway Management Planned: LMA  Additional Equipment: None  Intra-op Plan:   Post-operative Plan: Extubation in OR  Informed Consent: I have reviewed the patients History and Physical, chart, labs and discussed the procedure including the risks, benefits and alternatives for the proposed anesthesia with the patient or authorized representative who has indicated his/her understanding and acceptance.   Dental advisory given  Plan Discussed with: CRNA and Surgeon  Anesthesia Plan Comments:         Anesthesia Quick Evaluation

## 2017-03-17 NOTE — Transfer of Care (Signed)
Immediate Anesthesia Transfer of Care Note  Patient: Christian [name]  Procedure(s) Performed: Procedure(s) (LRB): TRANSURETHRAL RESECTION OF BLADDER TUMOR (TURBT) (N/A)  Patient Location: PACU  Anesthesia Type: General  Level of Consciousness: awake, sedated, patient cooperative and responds to stimulation  Airway & Oxygen Therapy: Patient Spontanous Breathing and Patient connected to [place] oxygen  Post-op Assessment: Report given to PACU RN, Post -op Vital signs reviewed and stable and Patient moving all extremities  Post vital signs: Reviewed and stable  Complications: No apparent anesthesia complications

## 2017-03-17 NOTE — Anesthesia Procedure Notes (Signed)
Procedure Name: LMA Insertion Date/Time: 03/17/2017 12:23 PM Performed by: Justice Rocher Pre-anesthesia Checklist: Patient identified, Emergency Drugs available, Suction available and Patient being monitored Patient Re-evaluated:Patient Re-evaluated prior to induction Oxygen Delivery Method: Circle system utilized Preoxygenation: Pre-oxygenation with 100% oxygen Induction Type: IV induction Ventilation: Mask ventilation without difficulty LMA: LMA inserted LMA Size: 5.0 Number of attempts: 1 Airway Equipment and Method: Bite block Placement Confirmation: positive ETCO2 and breath sounds checked- equal and bilateral Tube secured with: Tape Dental Injury: Teeth and Oropharynx as per pre-operative assessment

## 2017-03-17 NOTE — Anesthesia Postprocedure Evaluation (Signed)
Anesthesia Post Note  Patient: Christian [name]  Procedure(s) Performed: TRANSURETHRAL RESECTION OF BLADDER TUMOR (TURBT) (N/A Bladder)     Patient location during evaluation: PACU Anesthesia Type: General Level of consciousness: awake and alert Pain management: pain level controlled Vital Signs Assessment: post-procedure vital signs reviewed and stable Respiratory status: spontaneous breathing, nonlabored ventilation, respiratory function stable and patient connected to nasal cannula oxygen Cardiovascular status: blood pressure returned to baseline and stable Postop Assessment: no apparent nausea or vomiting Anesthetic complications: no    Last Vitals:  Vitals:   03/17/17 1315 03/17/17 1330  BP: 127/83 134/87  Pulse: 65 64  Resp: 19 19  Temp:    SpO2: 99%     Last Pain:  Vitals:   03/17/17 0959  TempSrc: Oral                 Jovontae Banko S

## 2017-03-17 NOTE — Brief Op Note (Signed)
03/17/2017  12:53 PM  PATIENT:  [name]  56 y.o. male  PRE-OPERATIVE DIAGNOSIS:  BLADDER CANCER  POST-OPERATIVE DIAGNOSIS:  BLADDER CANCER  PROCEDURE:  Procedure(s): TRANSURETHRAL RESECTION OF BLADDER TUMOR (TURBT) (N/A)  SURGEON:  Surgeon(s) and Role:    * Matty Deamer, Candee Furbish, MD - Primary  PHYSICIAN ASSISTANT:   ASSISTANTS: none   ANESTHESIA:   general  EBL:  none   BLOOD ADMINISTERED:none  DRAINS: none   LOCAL MEDICATIONS USED:  NONE  SPECIMEN:  No Specimen  DISPOSITION OF SPECIMEN:  N/A  COUNTS:  YES  TOURNIQUET:  * No tourniquets in log *  DICTATION: .Note written in EPIC  PLAN OF CARE: Discharge to home after PACU  PATIENT DISPOSITION:  PACU - hemodynamically stable.   Delay start of Pharmacological VTE agent (>24hrs) due to surgical blood loss or risk of bleeding: not applicable

## 2017-03-19 ENCOUNTER — Emergency Department (HOSPITAL_COMMUNITY)
Admission: EM | Admit: 2017-03-19 | Discharge: 2017-03-20 | Disposition: A | Discharge status: Home / Self Care | Payer: 59

## 2017-03-20 ENCOUNTER — Emergency Department (HOSPITAL_COMMUNITY)
Admission: EM | Admit: 2017-03-20 | Discharge: 2017-03-20 | Disposition: A | Discharge status: Home / Self Care | Payer: 59 | Attending: Emergency Medicine | Admitting: Emergency Medicine

## 2017-03-20 ENCOUNTER — Encounter (HOSPITAL_COMMUNITY): Payer: Self-pay

## 2017-03-20 DIAGNOSIS — Z8551 Personal history of malignant neoplasm of bladder: Secondary | ICD-10-CM | POA: Diagnosis not present

## 2017-03-20 DIAGNOSIS — I10 Essential (primary) hypertension: Secondary | ICD-10-CM | POA: Insufficient documentation

## 2017-03-20 DIAGNOSIS — J45909 Unspecified asthma, uncomplicated: Secondary | ICD-10-CM | POA: Diagnosis not present

## 2017-03-20 DIAGNOSIS — R339 Retention of urine, unspecified: Secondary | ICD-10-CM | POA: Diagnosis not present

## 2017-03-20 DIAGNOSIS — R338 Other retention of urine: Secondary | ICD-10-CM

## 2017-03-20 LAB — URINALYSIS, ROUTINE W REFLEX MICROSCOPIC
BACTERIA UA: NONE SEEN
Bilirubin Urine: NEGATIVE
GLUCOSE, UA: NEGATIVE mg/dL
KETONES UR: NEGATIVE mg/dL
Leukocytes, UA: NEGATIVE
Nitrite: NEGATIVE
PROTEIN: NEGATIVE mg/dL
Specific Gravity, Urine: 1.008 (ref 1.005–1.030)
pH: 6 (ref 5.0–8.0)

## 2017-03-20 NOTE — ED Triage Notes (Signed)
Post op states now unable to urinate states painful with distended bladder noted.

## 2017-03-20 NOTE — ED Notes (Signed)
Bed: [facility] Expected date:  Expected time:  Means of arrival:  Comments: 

## 2017-03-20 NOTE — ED Notes (Signed)
ED Provider at bedside. 

## 2017-03-20 NOTE — ED Notes (Signed)
Bed: [facility] Expected date:  Expected time:  Means of arrival:  Comments: 
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
## 2017-03-24 DIAGNOSIS — R339 Retention of urine, unspecified: Secondary | ICD-10-CM | POA: Diagnosis not present

## 2017-03-24 NOTE — Op Note (Signed)
.  Preoperative diagnosis: bladder tumor  Postoperative diagnosis: Same  Procedure: 1 cystoscopy 2. Transurethral resection of bladder tumor, small  Attending: Rosie Fate  Anesthesia: General  Estimated blood loss: Minimal  Drains: none  Specimens: none  Antibiotics: ancef  Findings: multiple 0.25cm papillary tumors involving the posterior and left lateral wall.  Ureteral orifices in normal anatomic location.   Indications: Patient is a 56 year old male with a history of bladder tumor.  After discussing treatment options, they decided proceed with transurethral resection of a bladder tumor.  Procedure her in detail: The patient was brought to the operating room and a brief timeout was done to ensure correct patient, correct procedure, correct site.  General anesthesia was administered patient was placed in dorsal lithotomy position.  Their genitalia was then prepped and draped in usual sterile fashion.  A rigid 56 French cystoscope was passed in the urethra and the bladder.  Bladder was inspected and we noted multiple tiny bladder tumor.  the ureteral orifices were in the normal orthotopic locations. Using the bipolar resectoscope we removed the bladder tumor down to the base. A subsequent muscle deep biopsy was then taken. Hemostasis was then obtained with electrocautery. We then removed the bladder tumor chips and sent them for pathology. We then re-inspected the bladder and found no residula bleeding.  the bladder was then drained and this concluded the procedure which was well tolerated by patient.  Complications: None  Condition: Stable, extubated, transferred to PACU  Plan: Patient is to be discharged home and followup in 7 days.

## 2017-04-15 IMAGING — RF DG C-ARM 1-60 MIN-NO REPORT
1 series · 6 of 6 positions shown · non-contrast
Comparison: none

[Series 1: run · 6 of 6 slices shown]
[im 1/6]
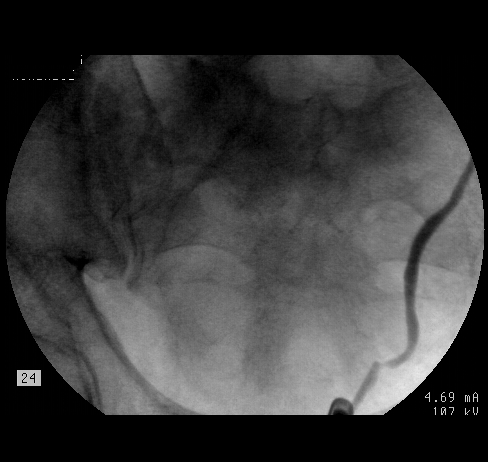
[im 2/6]
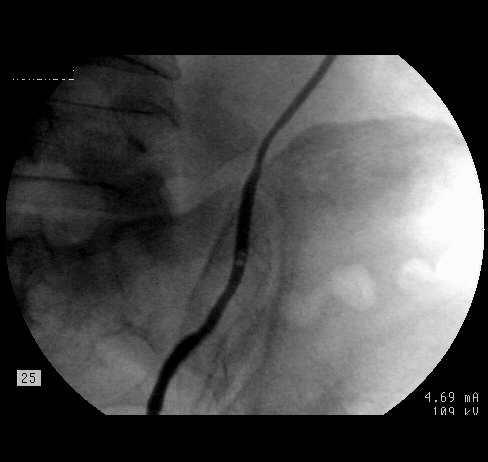
[im 3/6]
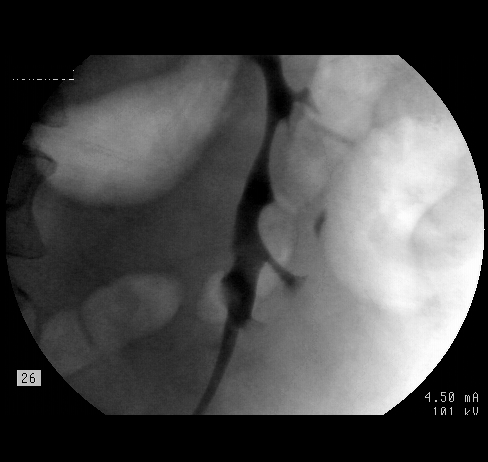
[im 4/6]
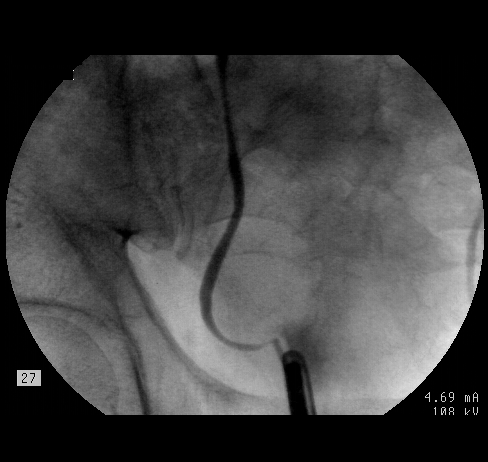
[im 5/6]
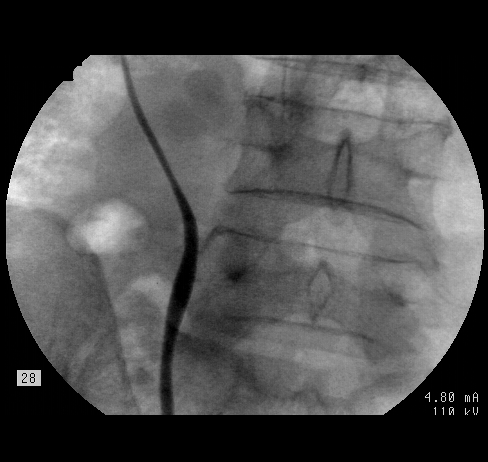
[im 6/6]
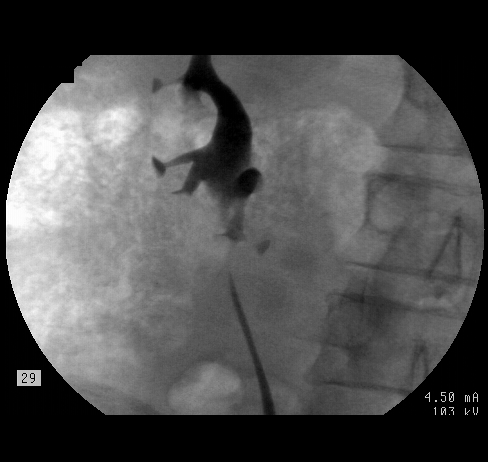

[6 of 6 positions shown; findings below may reference images not displayed]

:
Fluoroscopy was utilized by the requesting physician. No
radiographic interpretation.

## 2017-04-18 IMAGING — RF DG ABDOMEN 1V
1 series · 6 of 6 positions shown · non-contrast
Comparison: CT 11/30/2011.

CLINICAL DATA: Retrograde pyelogram.

EXAM:
ABDOMEN - 1 VIEW

[Series 1: run · 6 of 6 slices shown]
[im 1/6]
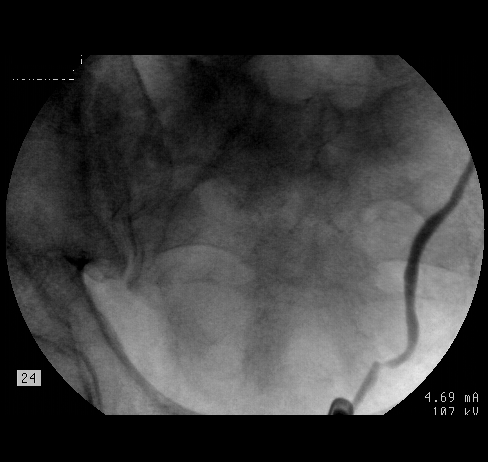
[im 2/6]
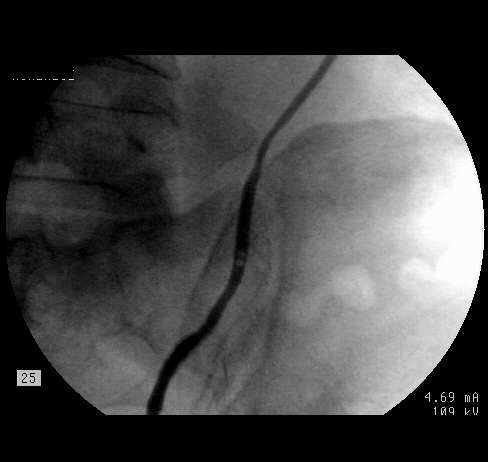
[im 3/6]
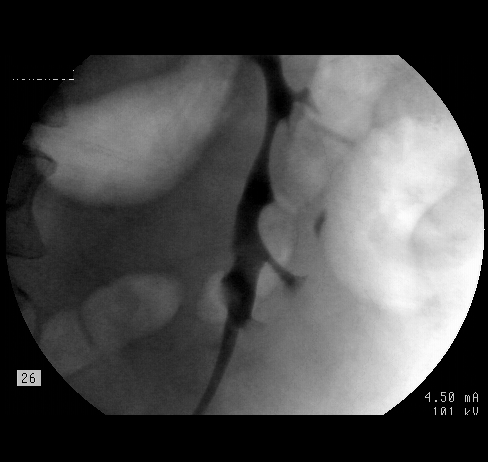
[im 4/6]
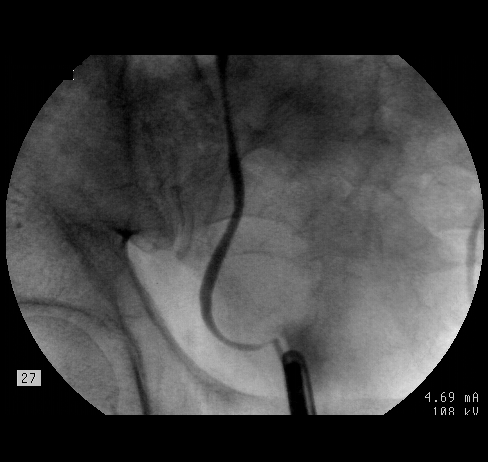
[im 5/6]
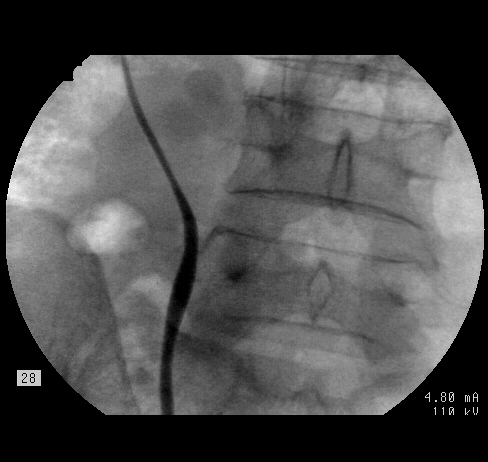
[im 6/6]
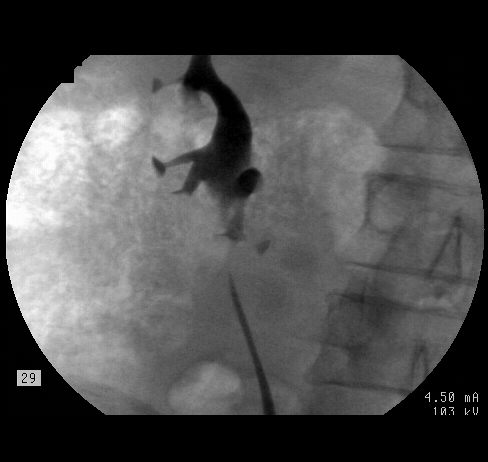

[6 of 6 positions shown; findings below may reference images not displayed]

FINDINGS: Images from 09/04/2015 available for review. Two small filling
defects noted in the left mid ureter, these may represent air
bubbles. These are nonobstructing. Tiny nonobstructing fixed lesions
or stones cannot be excluded. Ureters and renal collecting systems
are otherwise unremarkable.
IMPRESSION: Two small filling defects in the left mid ureter, these could
represent air bubbles. Tiny nonobstructing fixed lesions or stones
cannot be completely excluded. Exam is otherwise unremarkable.

## 2017-05-05 DIAGNOSIS — C672 Malignant neoplasm of lateral wall of bladder: Secondary | ICD-10-CM | POA: Diagnosis not present

## 2017-05-05 DIAGNOSIS — Z5111 Encounter for antineoplastic chemotherapy: Secondary | ICD-10-CM | POA: Diagnosis not present

## 2017-05-12 DIAGNOSIS — C672 Malignant neoplasm of lateral wall of bladder: Secondary | ICD-10-CM | POA: Diagnosis not present

## 2017-05-12 DIAGNOSIS — Z5111 Encounter for antineoplastic chemotherapy: Secondary | ICD-10-CM | POA: Diagnosis not present

## 2017-05-26 ENCOUNTER — Ambulatory Visit: Payer: 59 | Admitting: Internal Medicine

## 2017-05-26 ENCOUNTER — Encounter: Payer: Self-pay | Admitting: Internal Medicine

## 2017-05-26 VITALS — BP 124/80 | HR 69 | Temp 98.4°F | Ht 69.0 in | Wt 261.0 lb

## 2017-05-26 DIAGNOSIS — I10 Essential (primary) hypertension: Secondary | ICD-10-CM | POA: Diagnosis not present

## 2017-05-26 DIAGNOSIS — E785 Hyperlipidemia, unspecified: Secondary | ICD-10-CM

## 2017-05-26 DIAGNOSIS — Z6839 Body mass index (BMI) 39.0-39.9, adult: Secondary | ICD-10-CM

## 2017-05-26 DIAGNOSIS — C670 Malignant neoplasm of trigone of bladder: Secondary | ICD-10-CM

## 2017-05-26 DIAGNOSIS — F410 Panic disorder [episodic paroxysmal anxiety] without agoraphobia: Secondary | ICD-10-CM | POA: Diagnosis not present

## 2017-05-26 DIAGNOSIS — C672 Malignant neoplasm of lateral wall of bladder: Secondary | ICD-10-CM | POA: Diagnosis not present

## 2017-05-26 DIAGNOSIS — Z5111 Encounter for antineoplastic chemotherapy: Secondary | ICD-10-CM | POA: Diagnosis not present

## 2017-05-26 NOTE — Assessment & Plan Note (Signed)
BCG treatment

## 2017-05-26 NOTE — Progress Notes (Signed)
Subjective:  Patient ID: Christian [name], male    DOB: 1960-05-31  Age: 57 y.o. MRN: [id number]  CC: No chief complaint on file.   HPI Christian [name] presents for HTN, anxiety, GERD f/u  Outpatient Medications Prior to Visit  Medication Sig Dispense Refill  . albuterol (PROAIR HFA) 108 (90 Base) MCG/ACT inhaler Inhale 2 puffs into the lungs every 6 (six) hours as needed for wheezing. For shortness of breath. 8.5 Inhaler 3  . amLODipine (NORVASC) 5 MG tablet Take 1 tablet (5 mg total) by mouth daily. 90 tablet 3  . aspirin EC 325 MG tablet Take 325 mg every 6 (six) hours as needed by mouth for mild pain.     . clonazePAM (KLONOPIN) 1 MG tablet TAKE 1 TABLET TWICE A DAY AS NEEDED FOR ANXIETY (Patient taking differently: Take 1 mg 2 (two) times daily as needed by mouth for anxiety. ) 60 tablet 2  . HYDROcodone-acetaminophen (NORCO) 7.5-325 MG tablet Take 1 tablet by mouth every 6 (six) hours as needed for moderate pain or severe pain. 30 tablet 0  . indomethacin (INDOCIN) 50 MG capsule Take 1 capsule (50 mg total) by mouth 3 (three) times daily as needed for moderate pain. 30 capsule 3  . irbesartan (AVAPRO) 300 MG tablet Take 1 tablet (300 mg total) by mouth daily. 90 tablet 3  . ranitidine (ZANTAC) 150 MG tablet Take 150 mg by mouth daily as needed for heartburn.     . sildenafil (VIAGRA) 100 MG tablet TAKE 1 TABLET BY MOUTH AS NEEDED 12 tablet 8   No facility-administered medications prior to visit.     ROS Review of Systems  Constitutional: Positive for unexpected weight change. Negative for appetite change and fatigue.  HENT: Negative for congestion, nosebleeds, sneezing, sore throat and trouble swallowing.   Eyes: Negative for itching and visual disturbance.  Respiratory: Negative for cough.   Cardiovascular: Negative for chest pain, palpitations and leg swelling.  Gastrointestinal: Negative for abdominal distention, blood in stool, diarrhea and nausea.  Genitourinary: Negative  for frequency and hematuria.  Musculoskeletal: Negative for back pain, gait problem, joint swelling and neck pain.  Skin: Negative for rash.  Neurological: Negative for dizziness, tremors, speech difficulty and weakness.  Psychiatric/Behavioral: Negative for agitation, dysphoric mood and sleep disturbance. The patient is not nervous/anxious.     Objective:  BP 124/80 (BP Location: Left Arm, Patient Position: Sitting, Cuff Size: Large)   Pulse 69   Temp 98.4 F (36.9 C) (Oral)   Ht 5\' 9"  (1.753 m)   Wt 261 lb (118.4 kg)   SpO2 99%   BMI 38.54 kg/m   BP Readings from Last 3 Encounters:  05/26/17 124/80  03/20/17 126/85  03/17/17 135/87    Wt Readings from Last 3 Encounters:  05/26/17 261 lb (118.4 kg)  03/20/17 241 lb (109.3 kg)  03/17/17 241 lb (109.3 kg)    Physical Exam  Constitutional: He is oriented to person, place, and time. He appears well-developed. No distress.  NAD  HENT:  Mouth/Throat: Oropharynx is clear and moist.  Eyes: Conjunctivae are normal. Pupils are equal, round, and reactive to light.  Neck: Normal range of motion. No JVD present. No thyromegaly present.  Cardiovascular: Normal rate, regular rhythm, normal heart sounds and intact distal pulses. Exam reveals no gallop and no friction rub.  No murmur heard. Pulmonary/Chest: Effort normal and breath sounds normal. No respiratory distress. He has no wheezes. He has no rales. He exhibits no tenderness.  Abdominal: Soft. Bowel sounds are normal. He exhibits no distension and no mass. There is no tenderness. There is no rebound and no guarding.  Musculoskeletal: Normal range of motion. He exhibits no edema or tenderness.  Lymphadenopathy:    He has no cervical adenopathy.  Neurological: He is alert and oriented to person, place, and time. He has normal reflexes. No cranial nerve deficit. He exhibits normal muscle tone. He displays a negative Romberg sign. Coordination and gait normal.  Skin: Skin is warm and  dry. No rash noted.  Psychiatric: He has a normal mood and affect. His behavior is normal. Judgment and thought content normal.  obese  Lab Results  Component Value Date   WBC 7.6 09/21/2016   HGB 12.9 (L) 03/17/2017   HCT 38.0 (L) 03/17/2017   PLT 190.0 09/21/2016   GLUCOSE 99 03/17/2017   CHOL 234 (H) 06/03/2015   TRIG 190.0 (H) 06/03/2015   HDL 39.30 06/03/2015   LDLDIRECT 129.0 11/20/2014   LDLCALC 156 (H) 06/03/2015   ALT 48 01/24/2017   AST 46 (H) 01/24/2017   NA 141 03/17/2017   K 4.1 03/17/2017   CL 104 03/17/2017   CREATININE 0.70 03/17/2017   BUN 12 03/17/2017   CO2 26 01/24/2017   TSH 1.09 09/21/2016   PSA 0.75 11/20/2014   INR 1.01 03/28/2012   HGBA1C 5.5 05/13/2014    No results found.  Assessment & Plan:   There are no diagnoses linked to this encounter. I am having Christian [name] "[name]" maintain his sildenafil, aspirin EC, ranitidine, indomethacin, albuterol, clonazePAM, irbesartan, amLODipine, and HYDROcodone-acetaminophen.  No orders of the defined types were placed in this encounter.    Follow-up: No Follow-up on file.  Walker Kehr, MD

## 2017-05-26 NOTE — Assessment & Plan Note (Signed)
BP Readings from Last 3 Encounters:  05/26/17 124/80  03/20/17 126/85  03/17/17 135/87

## 2017-05-26 NOTE — Assessment & Plan Note (Signed)
Discussed Ref to wt loss clinic was discussed

## 2017-05-26 NOTE — Assessment & Plan Note (Signed)
Klonopin - occasional use prn - not w/alcohol  Potential benefits of a long term benzodiazepines  use as well as potential risks  and complications were explained to the patient and were aknowledged.  

## 2017-05-30 ENCOUNTER — Other Ambulatory Visit: Payer: Self-pay | Admitting: Internal Medicine

## 2017-05-30 NOTE — Telephone Encounter (Signed)
Check [place] registry last filled 04/15/2017.Marland KitchenJohny [name]

## 2017-07-04 DIAGNOSIS — C672 Malignant neoplasm of lateral wall of bladder: Secondary | ICD-10-CM | POA: Diagnosis not present

## 2017-07-04 DIAGNOSIS — R339 Retention of urine, unspecified: Secondary | ICD-10-CM | POA: Diagnosis not present

## 2017-07-04 DIAGNOSIS — C670 Malignant neoplasm of trigone of bladder: Secondary | ICD-10-CM | POA: Diagnosis not present

## 2017-08-21 ENCOUNTER — Other Ambulatory Visit: Payer: Self-pay | Admitting: Internal Medicine

## 2017-08-21 NOTE — Telephone Encounter (Signed)
Copied from [place] [id number]. Topic: Quick Communication - Rx Refill/Question >> Aug 21, 2017 11:03 AM Cecelia Byars, NT wrote: Medication:  indomethacin (INDOCIN) 50 MG capsule , HYDROcodone-acetaminophen (NORCO) 7.5-325 MG tablet  Has the patient contacted their pharmacy? yes (Agent: If no, request that the patient contact the pharmacy for the refill. Preferred Pharmacy (with phone number or street name):[facility], [place] [phone] (Phone) [phone] (Fax) Agent: Please be advised that RX refills may take up to 3 business days. We ask that you follow-up with your pharmacy. He is out of the medications for gout  , unsure of the name of the medications  please refill as well

## 2017-08-21 NOTE — Telephone Encounter (Signed)
Norco and Indocin refill requests  LOV 05/26/17 with Dr. Alain Marion  [facility] [place], [address].

## 2017-08-24 MED ORDER — HYDROCODONE-ACETAMINOPHEN 7.5-325 MG PO TABS: 1.0000 | ORAL_TABLET | Freq: Four times a day (QID) | ORAL | 0 refills | Status: DC | PRN

## 2017-08-24 MED ORDER — INDOMETHACIN 50 MG PO CAPS: 50.0000 mg | ORAL_CAPSULE | Freq: Three times a day (TID) | ORAL | 3 refills | Status: DC | PRN

## 2017-08-24 NOTE — Telephone Encounter (Signed)
Refill of Norco and Indocin  LOV 05/26/17  Dr. Alain Marion Pt has appt scheduled 10/20/17  Also requests refill of Mitigare 0.6mg    Not on current med list. LRF 07/14/16.  [place]

## 2017-08-24 NOTE — Telephone Encounter (Signed)
PTs wife called to check on status of refill  Of norco and indocin  And also request mitigare .6mg  refill.    [facility] [address], [place] - [address] [phone] (Phone) [phone] (Fax)

## 2017-09-05 ENCOUNTER — Telehealth: Payer: Self-pay | Admitting: Internal Medicine

## 2017-09-05 NOTE — Telephone Encounter (Signed)
LOV 05/26/2017. Last cologuard was negative. Okay to reorder with PCP approval.

## 2017-09-05 NOTE — Telephone Encounter (Signed)
Last Cologurad: 6/ 29/ 2015  LOV: 1/ 11/ 2019 Next appt:6 /7/ 2019 Would you like to reorder cologuard?

## 2017-09-07 NOTE — Telephone Encounter (Signed)
Please advise. Is it okay to proceed with reordering the cologuard.  Thanks, [employer]

## 2017-09-08 NOTE — Telephone Encounter (Signed)
Ok if ok w/Mike Thx

## 2017-09-08 NOTE — Telephone Encounter (Signed)
Called and left message for patient to return call to clinic if he was okay with doing cologuard again since he is past due.

## 2017-10-03 ENCOUNTER — Encounter (INDEPENDENT_AMBULATORY_CARE_PROVIDER_SITE_OTHER): Payer: Self-pay

## 2017-10-10 DIAGNOSIS — C670 Malignant neoplasm of trigone of bladder: Secondary | ICD-10-CM | POA: Diagnosis not present

## 2017-10-10 DIAGNOSIS — C672 Malignant neoplasm of lateral wall of bladder: Secondary | ICD-10-CM | POA: Diagnosis not present

## 2017-10-13 ENCOUNTER — Encounter: Payer: 59 | Admitting: Internal Medicine

## 2017-10-18 ENCOUNTER — Encounter (INDEPENDENT_AMBULATORY_CARE_PROVIDER_SITE_OTHER): Payer: Self-pay | Admitting: Family Medicine

## 2017-10-18 ENCOUNTER — Ambulatory Visit (INDEPENDENT_AMBULATORY_CARE_PROVIDER_SITE_OTHER): Payer: 59 | Admitting: Family Medicine

## 2017-10-18 VITALS — BP 143/84 | HR 66 | Temp 98.0°F | Ht 69.0 in | Wt 256.0 lb

## 2017-10-18 DIAGNOSIS — Z0289 Encounter for other administrative examinations: Secondary | ICD-10-CM

## 2017-10-18 DIAGNOSIS — Z1331 Encounter for screening for depression: Secondary | ICD-10-CM

## 2017-10-18 DIAGNOSIS — R0602 Shortness of breath: Secondary | ICD-10-CM | POA: Diagnosis not present

## 2017-10-18 DIAGNOSIS — I10 Essential (primary) hypertension: Secondary | ICD-10-CM

## 2017-10-18 DIAGNOSIS — E7849 Other hyperlipidemia: Secondary | ICD-10-CM

## 2017-10-18 DIAGNOSIS — R5383 Other fatigue: Secondary | ICD-10-CM | POA: Diagnosis not present

## 2017-10-18 DIAGNOSIS — Z9189 Other specified personal risk factors, not elsewhere classified: Secondary | ICD-10-CM | POA: Diagnosis not present

## 2017-10-18 DIAGNOSIS — Z6837 Body mass index (BMI) 37.0-37.9, adult: Secondary | ICD-10-CM | POA: Diagnosis not present

## 2017-10-18 DIAGNOSIS — E66812 Obesity, class 2: Secondary | ICD-10-CM

## 2017-10-18 DIAGNOSIS — D6489 Other specified anemias: Secondary | ICD-10-CM | POA: Diagnosis not present

## 2017-10-18 DIAGNOSIS — R739 Hyperglycemia, unspecified: Secondary | ICD-10-CM | POA: Diagnosis not present
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
## 2017-10-19 LAB — INSULIN, RANDOM: INSULIN: 27 u[IU]/mL — ABNORMAL HIGH (ref 2.6–24.9)

## 2017-10-19 LAB — COMPREHENSIVE METABOLIC PANEL
ALK PHOS: 77 IU/L (ref 39–117)
ALT: 53 IU/L — ABNORMAL HIGH (ref 0–44)
AST: 49 IU/L — AB (ref 0–40)
Albumin/Globulin Ratio: 2 (ref 1.2–2.2)
Albumin: 4.7 g/dL (ref 3.5–5.5)
BILIRUBIN TOTAL: 0.6 mg/dL (ref 0.0–1.2)
BUN/Creatinine Ratio: 15 (ref 9–20)
BUN: 13 mg/dL (ref 6–24)
CO2: 24 mmol/L (ref 20–29)
Calcium: 9.9 mg/dL (ref 8.7–10.2)
Chloride: 99 mmol/L (ref 96–106)
Creatinine, Ser: 0.86 mg/dL (ref 0.76–1.27)
GFR calc Af Amer: 114 mL/min/{1.73_m2} (ref >59)
GFR calc non Af Amer: 98 mL/min/{1.73_m2} (ref >59)
GLOBULIN, TOTAL: 2.3 g/dL (ref 1.5–4.5)
Glucose: 105 mg/dL — ABNORMAL HIGH (ref 65–99)
Potassium: 4.4 mmol/L (ref 3.5–5.2)
SODIUM: 140 mmol/L (ref 134–144)
Total Protein: 7 g/dL (ref 6.0–8.5)

## 2017-10-19 LAB — LIPID PANEL WITH LDL/HDL RATIO
CHOLESTEROL TOTAL: 293 mg/dL — AB (ref 100–199)
HDL: 38 mg/dL — ABNORMAL LOW (ref >39)
TRIGLYCERIDES: 415 mg/dL — AB (ref 0–149)

## 2017-10-19 LAB — ANEMIA PANEL
FERRITIN: 320 ng/mL (ref 30–400)
FOLATE, HEMOLYSATE: 496.8 ng/mL
FOLATE, RBC: 1180 ng/mL (ref >498)
Hematocrit: 42.1 % (ref 37.5–51.0)
Iron Saturation: 24 % (ref 15–55)
Iron: 89 ug/dL (ref 38–169)
Retic Ct Pct: 1.9 % (ref 0.6–2.6)
Total Iron Binding Capacity: 366 ug/dL (ref 250–450)
UIBC: 277 ug/dL (ref 111–343)
VITAMIN B 12: 549 pg/mL (ref 232–1245)

## 2017-10-19 LAB — TSH: TSH: 1.4 u[IU]/mL (ref 0.450–4.500)

## 2017-10-19 LAB — FOLATE: FOLATE: 12.7 ng/mL (ref >3.0)

## 2017-10-19 LAB — CBC WITH DIFFERENTIAL
BASOS ABS: 0 10*3/uL (ref 0.0–0.2)
Basos: 0 %
EOS (ABSOLUTE): 0.2 10*3/uL (ref 0.0–0.4)
EOS: 3 %
HEMOGLOBIN: 13.9 g/dL (ref 13.0–17.7)
Immature Grans (Abs): 0 10*3/uL (ref 0.0–0.1)
Immature Granulocytes: 0 %
Lymphocytes Absolute: 1.9 10*3/uL (ref 0.7–3.1)
Lymphs: 28 %
MCH: 32.1 pg (ref 26.6–33.0)
MCHC: 33 g/dL (ref 31.5–35.7)
MCV: 97 fL (ref 79–97)
MONOCYTES: 7 %
Monocytes Absolute: 0.5 10*3/uL (ref 0.1–0.9)
NEUTROS ABS: 4.1 10*3/uL (ref 1.4–7.0)
Neutrophils: 62 %
RBC: 4.33 x10E6/uL (ref 4.14–5.80)
RDW: 13.7 % (ref 12.3–15.4)
WBC: 6.7 10*3/uL (ref 3.4–10.8)

## 2017-10-19 LAB — T3: T3, Total: 109 ng/dL (ref 71–180)

## 2017-10-19 LAB — T4, FREE: Free T4: 1.02 ng/dL (ref 0.82–1.77)

## 2017-10-19 LAB — HEMOGLOBIN A1C
Est. average glucose Bld gHb Est-mCnc: 114 mg/dL
HEMOGLOBIN A1C: 5.6 % (ref 4.8–5.6)

## 2017-10-19 LAB — VITAMIN D 25 HYDROXY (VIT D DEFICIENCY, FRACTURES): VIT D 25 HYDROXY: 15.6 ng/mL — AB (ref 30.0–100.0)

## 2017-10-20 ENCOUNTER — Other Ambulatory Visit (INDEPENDENT_AMBULATORY_CARE_PROVIDER_SITE_OTHER): Payer: 59

## 2017-10-20 ENCOUNTER — Encounter: Payer: Self-pay | Admitting: Internal Medicine

## 2017-10-20 ENCOUNTER — Ambulatory Visit (INDEPENDENT_AMBULATORY_CARE_PROVIDER_SITE_OTHER): Payer: 59 | Admitting: Internal Medicine

## 2017-10-20 VITALS — BP 134/86 | HR 68 | Temp 98.4°F | Ht 69.0 in | Wt 259.0 lb

## 2017-10-20 DIAGNOSIS — R7989 Other specified abnormal findings of blood chemistry: Secondary | ICD-10-CM

## 2017-10-20 DIAGNOSIS — M545 Low back pain, unspecified: Secondary | ICD-10-CM | POA: Insufficient documentation

## 2017-10-20 DIAGNOSIS — Z6839 Body mass index (BMI) 39.0-39.9, adult: Secondary | ICD-10-CM

## 2017-10-20 DIAGNOSIS — I10 Essential (primary) hypertension: Secondary | ICD-10-CM

## 2017-10-20 DIAGNOSIS — Z0000 Encounter for general adult medical examination without abnormal findings: Secondary | ICD-10-CM | POA: Diagnosis not present

## 2017-10-20 DIAGNOSIS — F410 Panic disorder [episodic paroxysmal anxiety] without agoraphobia: Secondary | ICD-10-CM | POA: Diagnosis not present

## 2017-10-20 DIAGNOSIS — E785 Hyperlipidemia, unspecified: Secondary | ICD-10-CM

## 2017-10-20 DIAGNOSIS — M2550 Pain in unspecified joint: Secondary | ICD-10-CM

## 2017-10-20 DIAGNOSIS — E559 Vitamin D deficiency, unspecified: Secondary | ICD-10-CM

## 2017-10-20 DIAGNOSIS — R945 Abnormal results of liver function studies: Secondary | ICD-10-CM

## 2017-10-20 DIAGNOSIS — G8929 Other chronic pain: Secondary | ICD-10-CM

## 2017-10-20 DIAGNOSIS — C670 Malignant neoplasm of trigone of bladder: Secondary | ICD-10-CM

## 2017-10-20 LAB — URINALYSIS, ROUTINE W REFLEX MICROSCOPIC
BILIRUBIN URINE: NEGATIVE
HGB URINE DIPSTICK: NEGATIVE
KETONES UR: NEGATIVE
NITRITE: NEGATIVE
RBC / HPF: NONE SEEN (ref 0–?)
Specific Gravity, Urine: 1.01 (ref 1.000–1.030)
Total Protein, Urine: NEGATIVE
URINE GLUCOSE: NEGATIVE
Urobilinogen, UA: 0.2 (ref 0.0–1.0)
pH: 6 (ref 5.0–8.0)

## 2017-10-20 LAB — LIPID PANEL
CHOLESTEROL: 299 mg/dL — AB (ref 0–200)
HDL: 34.2 mg/dL — AB (ref >39.00)
Total CHOL/HDL Ratio: 9
Triglycerides: 536 mg/dL — ABNORMAL HIGH (ref 0.0–149.0)

## 2017-10-20 LAB — CBC WITH DIFFERENTIAL/PLATELET
BASOS ABS: 0 10*3/uL (ref 0.0–0.1)
Basophils Relative: 0.6 % (ref 0.0–3.0)
EOS ABS: 0.2 10*3/uL (ref 0.0–0.7)
Eosinophils Relative: 2.8 % (ref 0.0–5.0)
HEMATOCRIT: 41.3 % (ref 39.0–52.0)
HEMOGLOBIN: 14.4 g/dL (ref 13.0–17.0)
Lymphocytes Relative: 24.8 % (ref 12.0–46.0)
Lymphs Abs: 1.5 10*3/uL (ref 0.7–4.0)
MCHC: 34.8 g/dL (ref 30.0–36.0)
MCV: 94.8 fl (ref 78.0–100.0)
MONOS PCT: 7.7 % (ref 3.0–12.0)
Monocytes Absolute: 0.5 10*3/uL (ref 0.1–1.0)
NEUTROS ABS: 3.8 10*3/uL (ref 1.4–7.7)
Neutrophils Relative %: 64.1 % (ref 43.0–77.0)
PLATELETS: 146 10*3/uL — AB (ref 150.0–400.0)
RBC: 4.35 Mil/uL (ref 4.22–5.81)
RDW: 13.1 % (ref 11.5–15.5)
WBC: 5.9 10*3/uL (ref 4.0–10.5)

## 2017-10-20 LAB — BASIC METABOLIC PANEL
BUN: 15 mg/dL (ref 6–23)
CHLORIDE: 102 meq/L (ref 96–112)
CO2: 27 meq/L (ref 19–32)
Calcium: 9.9 mg/dL (ref 8.4–10.5)
Creatinine, Ser: 0.88 mg/dL (ref 0.40–1.50)
GFR: 95.58 mL/min (ref >60.00)
Glucose, Bld: 120 mg/dL — ABNORMAL HIGH (ref 70–99)
POTASSIUM: 4.2 meq/L (ref 3.5–5.1)
Sodium: 139 mEq/L (ref 135–145)

## 2017-10-20 LAB — TSH: TSH: 1.75 u[IU]/mL (ref 0.35–4.50)

## 2017-10-20 LAB — HEPATIC FUNCTION PANEL
ALBUMIN: 4.8 g/dL (ref 3.5–5.2)
ALT: 50 U/L (ref 0–53)
AST: 38 U/L — ABNORMAL HIGH (ref 0–37)
Alkaline Phosphatase: 71 U/L (ref 39–117)
Bilirubin, Direct: 0.1 mg/dL (ref 0.0–0.3)
Total Bilirubin: 0.9 mg/dL (ref 0.2–1.2)
Total Protein: 7.2 g/dL (ref 6.0–8.3)

## 2017-10-20 LAB — PSA: PSA: 1.63 ng/mL (ref 0.10–4.00)

## 2017-10-20 LAB — LDL CHOLESTEROL, DIRECT: LDL DIRECT: 172 mg/dL

## 2017-10-20 MED ORDER — IRBESARTAN 300 MG PO TABS: 300.0000 mg | ORAL_TABLET | Freq: Every day | ORAL | 3 refills | Status: DC

## 2017-10-20 MED ORDER — AMLODIPINE BESYLATE 5 MG PO TABS: 5.0000 mg | ORAL_TABLET | Freq: Every day | ORAL | 3 refills | Status: DC

## 2017-10-20 MED ORDER — HYDROCODONE-ACETAMINOPHEN 7.5-325 MG PO TABS: 0.5000 | ORAL_TABLET | Freq: Four times a day (QID) | ORAL | 0 refills | Status: DC | PRN

## 2017-10-20 MED ORDER — VITAMIN D3 1.25 MG (50000 UT) PO CAPS: 1.0000 | ORAL_CAPSULE | ORAL | 0 refills | Status: DC

## 2017-10-20 MED ORDER — COLCHICINE 0.6 MG PO CAPS: 1.0000 | ORAL_CAPSULE | Freq: Every day | ORAL | 11 refills | Status: DC | PRN

## 2017-10-20 MED ORDER — INDOMETHACIN 50 MG PO CAPS: 50.0000 mg | ORAL_CAPSULE | Freq: Three times a day (TID) | ORAL | 3 refills | Status: DC | PRN

## 2017-10-20 MED ORDER — CLONAZEPAM 1 MG PO TABS: ORAL_TABLET | ORAL | 2 refills | Status: DC

## 2017-10-20 MED ORDER — VITAMIN D3 50 MCG (2000 UT) PO CAPS
2000.0000 [IU] | ORAL_CAPSULE | Freq: Every day | ORAL | 3 refills | Status: DC
Start: 2017-10-20 — End: 2021-10-13

## 2017-10-20 NOTE — Assessment & Plan Note (Signed)
Dr Leafy Ro ref

## 2017-10-20 NOTE — Assessment & Plan Note (Signed)
F/u w/Urology 

## 2017-10-20 NOTE — Assessment & Plan Note (Signed)
start Vit D prescription 50000 iu weekly (Rx emailed to your pharmacy) followed by over-the-counter Vit D 2000 iu daily.  

## 2017-10-20 NOTE — Assessment & Plan Note (Signed)
Worse Norco prn rare  Potential benefits of a long term opioids use as well as potential risks (i.e. addiction risk, apnea etc) and complications (i.e. Somnolence, constipation and others) were explained to the patient and were aknowledged. Vit D

## 2017-10-20 NOTE — Assessment & Plan Note (Signed)
BP Readings from Last 3 Encounters:  10/20/17 134/86  10/18/17 (!) 143/84  05/26/17 124/80

## 2017-10-20 NOTE — Assessment & Plan Note (Addendum)
  We discussed age appropriate health related issues, including available/recomended screening tests and vaccinations. We discussed a need for adhering to healthy diet and exercise. Labs/EKG were reviewed/ordered. All questions were answered. Cologuard

## 2017-10-20 NOTE — Progress Notes (Signed)
Subjective:  Patient ID: Christian [name], male    DOB: 06-26-1960  Age: 57 y.o. MRN: [id number]  CC: No chief complaint on file.   HPI Christian [name] presents for a well exam. He started w/Dr Leafy Ro C/o LBP and B LE weakness w/walking  Outpatient Medications Prior to Visit  Medication Sig Dispense Refill  . amLODipine (NORVASC) 5 MG tablet Take 1 tablet (5 mg total) by mouth daily. 90 tablet 3  . clonazePAM (KLONOPIN) 1 MG tablet TAKE 1 TABLET BY MOUTH TWICE DAILY AS NEEDED FOR  ANXIETY 60 tablet 2  . Colchicine (MITIGARE) 0.6 MG CAPS Take 1 capsule by mouth daily as needed.    [name] HYDROcodone-acetaminophen (NORCO) 7.5-325 MG tablet Take 1 tablet by mouth every 6 (six) hours as needed for moderate pain or severe pain. 30 tablet 0  . indomethacin (INDOCIN) 50 MG capsule Take 1 capsule (50 mg total) by mouth 3 (three) times daily as needed for moderate pain. 30 capsule 3  . irbesartan (AVAPRO) 300 MG tablet Take 1 tablet (300 mg total) by mouth daily. 90 tablet 3  . tamsulosin (FLOMAX) 0.4 MG CAPS capsule Take 0.4 mg by mouth daily after supper.     No facility-administered medications prior to visit.     ROS: Review of Systems  Constitutional: Positive for fatigue. Negative for appetite change and unexpected weight change.  HENT: Negative for congestion, nosebleeds, sneezing, sore throat and trouble swallowing.   Eyes: Negative for itching and visual disturbance.  Respiratory: Negative for cough.   Cardiovascular: Negative for chest pain, palpitations and leg swelling.  Gastrointestinal: Negative for abdominal distention, blood in stool, diarrhea and nausea.  Genitourinary: Negative for frequency and hematuria.  Musculoskeletal: Positive for arthralgias, back pain and gait problem. Negative for joint swelling and neck pain.  Skin: Negative for rash.  Neurological: Positive for weakness. Negative for dizziness, tremors and speech difficulty.  Psychiatric/Behavioral: Negative for  agitation, dysphoric mood and sleep disturbance. The patient is not nervous/anxious.     Objective:  BP 134/86 (BP Location: Right Arm, Patient Position: Sitting, Cuff Size: Large)   Pulse 68   Temp 98.4 F (36.9 C) (Oral)   Ht 5\' 9"  (1.753 m)   Wt 259 lb (117.5 kg)   SpO2 98%   BMI 38.25 kg/m   BP Readings from Last 3 Encounters:  10/20/17 134/86  10/18/17 (!) 143/84  05/26/17 124/80    Wt Readings from Last 3 Encounters:  10/20/17 259 lb (117.5 kg)  10/18/17 256 lb (116.1 kg)  05/26/17 261 lb (118.4 kg)    Physical Exam  Constitutional: He is oriented to person, place, and time. He appears well-developed and well-nourished. No distress.  HENT:  Head: Normocephalic and atraumatic.  Right Ear: External ear normal.  Left Ear: External ear normal.  Nose: Nose normal.  Mouth/Throat: Oropharynx is clear and moist. No oropharyngeal exudate.  Eyes: Pupils are equal, round, and reactive to light. Conjunctivae and EOM are normal. Right eye exhibits no discharge. Left eye exhibits no discharge. No scleral icterus.  Neck: Normal range of motion. Neck supple. No JVD present. No tracheal deviation present. No thyromegaly present.  Cardiovascular: Normal rate, regular rhythm, normal heart sounds and intact distal pulses. Exam reveals no gallop and no friction rub.  No murmur heard. Pulmonary/Chest: Effort normal and breath sounds normal. No stridor. No respiratory distress. He has no wheezes. He has no rales. He exhibits no tenderness.  Abdominal: Soft. Bowel sounds are normal. He  exhibits no distension and no mass. There is no tenderness. There is no rebound and no guarding.  Genitourinary: Rectum normal, prostate normal and penis normal. Rectal exam shows guaiac negative stool. No penile tenderness.  Musculoskeletal: Normal range of motion. He exhibits tenderness. He exhibits no edema.  Lymphadenopathy:    He has no cervical adenopathy.  Neurological: He is alert and oriented to  person, place, and time. He has normal reflexes. He displays normal reflexes. No cranial nerve deficit. He exhibits normal muscle tone. Coordination abnormal.  Skin: Skin is warm and dry. No rash noted. He is not diaphoretic. No erythema. No pallor.  Psychiatric: He has a normal mood and affect. His behavior is normal. Judgment and thought content normal.  obese  Lab Results  Component Value Date   WBC 6.7 10/18/2017   HGB 13.9 10/18/2017   HCT 42.1 10/18/2017   PLT 190.0 09/21/2016   GLUCOSE 105 (H) 10/18/2017   CHOL 293 (H) 10/18/2017   TRIG 415 (H) 10/18/2017   HDL 38 (L) 10/18/2017   LDLDIRECT 129.0 11/20/2014   LDLCALC Comment 10/18/2017   ALT 53 (H) 10/18/2017   AST 49 (H) 10/18/2017   NA 140 10/18/2017   K 4.4 10/18/2017   CL 99 10/18/2017   CREATININE 0.86 10/18/2017   BUN 13 10/18/2017   CO2 24 10/18/2017   TSH 1.400 10/18/2017   PSA 0.75 11/20/2014   INR 1.01 03/28/2012   HGBA1C 5.6 10/18/2017    No results found.  Assessment & Plan:   There are no diagnoses linked to this encounter.   No orders of the defined types were placed in this encounter.    Follow-up: No follow-ups on file.  Walker Kehr, MD

## 2017-10-20 NOTE — Assessment & Plan Note (Signed)
Klonopin - occasional use prn - not w/alcohol  Potential benefits of a long term benzodiazepines  use as well as potential risks  and complications were explained to the patient and were aknowledged.  

## 2017-10-20 NOTE — Assessment & Plan Note (Signed)
Loose wt, reduce beer

## 2017-10-20 NOTE — Assessment & Plan Note (Signed)
?  spinal stenosis w/claudication Norco prn Wt loss

## 2017-10-20 NOTE — Patient Instructions (Signed)

## 2017-11-01 ENCOUNTER — Ambulatory Visit (INDEPENDENT_AMBULATORY_CARE_PROVIDER_SITE_OTHER): Payer: 59 | Admitting: Family Medicine

## 2017-11-01 VITALS — BP 111/76 | HR 80 | Temp 98.2°F | Ht 69.0 in | Wt 241.0 lb

## 2017-11-01 DIAGNOSIS — E559 Vitamin D deficiency, unspecified: Secondary | ICD-10-CM

## 2017-11-01 DIAGNOSIS — R7303 Prediabetes: Secondary | ICD-10-CM

## 2017-11-01 DIAGNOSIS — E782 Mixed hyperlipidemia: Secondary | ICD-10-CM

## 2017-11-01 DIAGNOSIS — Z6835 Body mass index (BMI) 35.0-35.9, adult: Secondary | ICD-10-CM

## 2017-11-01 DIAGNOSIS — Z9189 Other specified personal risk factors, not elsewhere classified: Secondary | ICD-10-CM | POA: Diagnosis not present

## 2017-11-01 MED ORDER — FENOFIBRATE 145 MG PO TABS: 145.0000 mg | ORAL_TABLET | Freq: Every day | ORAL | 0 refills | Status: DC

## 2017-11-01 MED ORDER — VITAMIN D3 1.25 MG (50000 UT) PO CAPS
1.0000 | ORAL_CAPSULE | ORAL | 0 refills | Status: DC
Start: 2017-11-01 — End: 2017-12-27
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
## 2017-11-10 DIAGNOSIS — C672 Malignant neoplasm of lateral wall of bladder: Secondary | ICD-10-CM | POA: Diagnosis not present

## 2017-11-10 DIAGNOSIS — C670 Malignant neoplasm of trigone of bladder: Secondary | ICD-10-CM | POA: Diagnosis not present

## 2017-11-15 ENCOUNTER — Encounter (INDEPENDENT_AMBULATORY_CARE_PROVIDER_SITE_OTHER): Payer: Self-pay | Admitting: Physician Assistant

## 2017-11-15 ENCOUNTER — Ambulatory Visit (INDEPENDENT_AMBULATORY_CARE_PROVIDER_SITE_OTHER): Payer: 59 | Admitting: Physician Assistant

## 2017-11-15 VITALS — BP 109/65 | HR 66 | Temp 98.6°F | Ht 69.0 in | Wt 238.0 lb

## 2017-11-15 DIAGNOSIS — I10 Essential (primary) hypertension: Secondary | ICD-10-CM | POA: Diagnosis not present

## 2017-11-15 DIAGNOSIS — Z6835 Body mass index (BMI) 35.0-35.9, adult: Secondary | ICD-10-CM
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
## 2017-11-17 DIAGNOSIS — C672 Malignant neoplasm of lateral wall of bladder: Secondary | ICD-10-CM | POA: Diagnosis not present

## 2017-11-17 DIAGNOSIS — Z5111 Encounter for antineoplastic chemotherapy: Secondary | ICD-10-CM | POA: Diagnosis not present

## 2017-11-17 DIAGNOSIS — C670 Malignant neoplasm of trigone of bladder: Secondary | ICD-10-CM | POA: Diagnosis not present

## 2017-11-24 DIAGNOSIS — C670 Malignant neoplasm of trigone of bladder: Secondary | ICD-10-CM | POA: Diagnosis not present

## 2017-11-24 DIAGNOSIS — C672 Malignant neoplasm of lateral wall of bladder: Secondary | ICD-10-CM | POA: Diagnosis not present

## 2017-12-02 ENCOUNTER — Other Ambulatory Visit (INDEPENDENT_AMBULATORY_CARE_PROVIDER_SITE_OTHER): Payer: Self-pay | Admitting: Family Medicine

## 2017-12-02 DIAGNOSIS — E782 Mixed hyperlipidemia: Secondary | ICD-10-CM

## 2017-12-04 ENCOUNTER — Other Ambulatory Visit (INDEPENDENT_AMBULATORY_CARE_PROVIDER_SITE_OTHER): Payer: Self-pay | Admitting: Family Medicine

## 2017-12-04 DIAGNOSIS — E782 Mixed hyperlipidemia: Secondary | ICD-10-CM

## 2017-12-06 ENCOUNTER — Encounter (INDEPENDENT_AMBULATORY_CARE_PROVIDER_SITE_OTHER): Payer: Self-pay | Admitting: Family Medicine

## 2017-12-06 ENCOUNTER — Ambulatory Visit (INDEPENDENT_AMBULATORY_CARE_PROVIDER_SITE_OTHER): Payer: 59 | Admitting: Family Medicine

## 2017-12-06 VITALS — BP 96/56 | HR 70 | Temp 98.5°F | Ht 69.0 in | Wt 230.0 lb

## 2017-12-06 DIAGNOSIS — E669 Obesity, unspecified: Secondary | ICD-10-CM | POA: Diagnosis not present

## 2017-12-06 DIAGNOSIS — Z9189 Other specified personal risk factors, not elsewhere classified: Secondary | ICD-10-CM

## 2017-12-06 DIAGNOSIS — E7849 Other hyperlipidemia: Secondary | ICD-10-CM

## 2017-12-06 DIAGNOSIS — Z6834 Body mass index (BMI) 34.0-34.9, adult: Secondary | ICD-10-CM | POA: Diagnosis not present

## 2017-12-06 MED ORDER — FENOFIBRATE 145 MG PO TABS: 145.0000 mg | ORAL_TABLET | Freq: Every day | ORAL | 0 refills | Status: DC
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
## 2017-12-27 ENCOUNTER — Ambulatory Visit (INDEPENDENT_AMBULATORY_CARE_PROVIDER_SITE_OTHER): Payer: 59 | Admitting: Family Medicine

## 2017-12-27 VITALS — BP 105/62 | HR 72 | Temp 98.3°F | Ht 69.0 in | Wt 223.0 lb

## 2017-12-27 DIAGNOSIS — E559 Vitamin D deficiency, unspecified: Secondary | ICD-10-CM | POA: Diagnosis not present

## 2017-12-27 DIAGNOSIS — E669 Obesity, unspecified: Secondary | ICD-10-CM

## 2017-12-27 DIAGNOSIS — Z9189 Other specified personal risk factors, not elsewhere classified: Secondary | ICD-10-CM | POA: Diagnosis not present

## 2017-12-27 DIAGNOSIS — E7849 Other hyperlipidemia: Secondary | ICD-10-CM | POA: Diagnosis not present

## 2017-12-27 DIAGNOSIS — Z6833 Body mass index (BMI) 33.0-33.9, adult: Secondary | ICD-10-CM

## 2017-12-27 MED ORDER — FENOFIBRATE 145 MG PO TABS: 145.0000 mg | ORAL_TABLET | Freq: Every day | ORAL | 0 refills | Status: DC

## 2017-12-27 MED ORDER — VITAMIN D3 1.25 MG (50000 UT) PO CAPS: 1.0000 | ORAL_CAPSULE | ORAL | 0 refills | Status: DC
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
## 2018-01-17 ENCOUNTER — Encounter (INDEPENDENT_AMBULATORY_CARE_PROVIDER_SITE_OTHER): Payer: Self-pay

## 2018-01-17 ENCOUNTER — Ambulatory Visit (INDEPENDENT_AMBULATORY_CARE_PROVIDER_SITE_OTHER): Payer: Self-pay | Admitting: Family Medicine

## 2018-02-02 ENCOUNTER — Other Ambulatory Visit (INDEPENDENT_AMBULATORY_CARE_PROVIDER_SITE_OTHER): Payer: Self-pay | Admitting: Family Medicine

## 2018-02-02 DIAGNOSIS — E7849 Other hyperlipidemia: Secondary | ICD-10-CM

## 2018-02-09 DIAGNOSIS — C670 Malignant neoplasm of trigone of bladder: Secondary | ICD-10-CM | POA: Diagnosis not present

## 2018-02-09 DIAGNOSIS — C672 Malignant neoplasm of lateral wall of bladder: Secondary | ICD-10-CM | POA: Diagnosis not present

## 2018-05-11 DIAGNOSIS — C672 Malignant neoplasm of lateral wall of bladder: Secondary | ICD-10-CM | POA: Diagnosis not present

## 2018-05-11 DIAGNOSIS — Z5111 Encounter for antineoplastic chemotherapy: Secondary | ICD-10-CM | POA: Diagnosis not present

## 2018-05-18 DIAGNOSIS — Z5111 Encounter for antineoplastic chemotherapy: Secondary | ICD-10-CM | POA: Diagnosis not present

## 2018-05-18 DIAGNOSIS — C672 Malignant neoplasm of lateral wall of bladder: Secondary | ICD-10-CM | POA: Diagnosis not present

## 2018-05-25 DIAGNOSIS — C672 Malignant neoplasm of lateral wall of bladder: Secondary | ICD-10-CM | POA: Diagnosis not present

## 2018-05-25 DIAGNOSIS — Z5111 Encounter for antineoplastic chemotherapy: Secondary | ICD-10-CM | POA: Diagnosis not present

## 2018-06-02 ENCOUNTER — Other Ambulatory Visit: Payer: Self-pay | Admitting: Internal Medicine

## 2018-06-03 MED ORDER — CLONAZEPAM 1 MG PO TABS: ORAL_TABLET | ORAL | 0 refills | Status: DC

## 2018-07-06 DIAGNOSIS — C670 Malignant neoplasm of trigone of bladder: Secondary | ICD-10-CM | POA: Diagnosis not present

## 2018-07-06 DIAGNOSIS — C672 Malignant neoplasm of lateral wall of bladder: Secondary | ICD-10-CM | POA: Diagnosis not present

## 2018-08-28 ENCOUNTER — Other Ambulatory Visit: Payer: Self-pay | Admitting: Internal Medicine

## 2018-08-31 ENCOUNTER — Other Ambulatory Visit: Payer: Self-pay | Admitting: Internal Medicine

## 2018-09-05 ENCOUNTER — Telehealth: Payer: Self-pay | Admitting: Internal Medicine

## 2018-09-05 NOTE — Telephone Encounter (Signed)
Appointment scheduled.

## 2018-09-05 NOTE — Telephone Encounter (Signed)
-----   Message from [place], [place] sent at 09/05/2018 10:06 AM EDT -----  ----- Message ----- From: Cassandria Anger, MD Sent: 08/31/2018   8:13 PM EDT To: Karren Cobble, CMA  Betty, Please sch VOV Thanks, AP

## 2018-09-06 ENCOUNTER — Encounter: Payer: Self-pay | Admitting: Internal Medicine

## 2018-09-06 ENCOUNTER — Telehealth: Payer: Self-pay | Admitting: Internal Medicine

## 2018-09-06 ENCOUNTER — Ambulatory Visit (INDEPENDENT_AMBULATORY_CARE_PROVIDER_SITE_OTHER): Payer: 59 | Admitting: Internal Medicine

## 2018-09-06 DIAGNOSIS — Z0000 Encounter for general adult medical examination without abnormal findings: Secondary | ICD-10-CM

## 2018-09-06 DIAGNOSIS — R635 Abnormal weight gain: Secondary | ICD-10-CM

## 2018-09-06 DIAGNOSIS — F419 Anxiety disorder, unspecified: Secondary | ICD-10-CM | POA: Diagnosis not present

## 2018-09-06 DIAGNOSIS — M545 Low back pain: Secondary | ICD-10-CM

## 2018-09-06 DIAGNOSIS — E559 Vitamin D deficiency, unspecified: Secondary | ICD-10-CM

## 2018-09-06 DIAGNOSIS — I10 Essential (primary) hypertension: Secondary | ICD-10-CM | POA: Diagnosis not present

## 2018-09-06 DIAGNOSIS — G8929 Other chronic pain: Secondary | ICD-10-CM

## 2018-09-06 DIAGNOSIS — M109 Gout, unspecified: Secondary | ICD-10-CM | POA: Insufficient documentation

## 2018-09-06 MED ORDER — HYDROCODONE-ACETAMINOPHEN 7.5-325 MG PO TABS: 0.5000 | ORAL_TABLET | Freq: Four times a day (QID) | ORAL | 0 refills | Status: DC | PRN

## 2018-09-06 MED ORDER — COLCHICINE 0.6 MG PO CAPS: ORAL_CAPSULE | ORAL | 5 refills | Status: DC

## 2018-09-06 MED ORDER — COLCHICINE 0.6 MG PO TABS: ORAL_TABLET | ORAL | 5 refills | Status: DC

## 2018-09-06 MED ORDER — IRBESARTAN 300 MG PO TABS: 300.0000 mg | ORAL_TABLET | Freq: Every day | ORAL | 3 refills | Status: DC

## 2018-09-06 MED ORDER — CLONAZEPAM 1 MG PO TABS: ORAL_TABLET | ORAL | 3 refills | Status: DC

## 2018-09-06 NOTE — Telephone Encounter (Signed)
Capsules sent

## 2018-09-06 NOTE — Progress Notes (Signed)
Virtual Visit via Telephone Note  I connected with [name] on 09/06/18 at  8:30 AM EDT by telephone and verified that I am speaking with the correct person using two identifiers.   I discussed the limitations, risks, security and privacy concerns of performing an evaluation and management service by telephone and the availability of in person appointments. I also discussed with the patient that there may be a patient responsible charge related to this service. The patient expressed understanding and agreed to proceed.   History of Present Illness:   Christian [name] is complaining of gout attacks.  He is taking colchicine 1 a day as needed.  His colchicine brand is very expensive. Follow-up of low back pain and osteoarthritis- he needs Norco renewed. Follow-up anxiety.  He needs his clonazepam renewed. Kethan gained 40 pounds after initial 40 pound weight loss via Weight loss clinic. Follow-up hypertension Observations/Objective:  Christian [name] is in no acute distress.  He is overweight Assessment and Plan: See plan  Follow Up Instructions:    I discussed the assessment and treatment plan with the patient. The patient was provided an opportunity to ask questions and all were answered. The patient agreed with the plan and demonstrated an understanding of the instructions.   The patient was advised to call back or seek an in-person evaluation if the symptoms worsen or if the condition fails to improve as anticipated.  I provided 20 minutes of non-face-to-face time during this encounter.   Walker Kehr, MD

## 2018-09-06 NOTE — Assessment & Plan Note (Signed)
Will renew irbesartan

## 2018-09-06 NOTE — Assessment & Plan Note (Signed)
Recent weight gain.  Christian [name] will go back to the weight management clinic

## 2018-09-06 NOTE — Telephone Encounter (Signed)
Copied from [place] [id number]. Topic: Quick Communication - Rx Refill/Question >> Sep 06, 2018 10:54 AM Robina Ade, Helene Kelp D wrote: Medication: colchicine 0.6 MG tablet  Has the patient contacted their pharmacy? Yes, pharmacy would like to change med to caps instead since insurance is what will cover. (Agent: If no, request that the patient contact the pharmacy for the refill.) (Agent: If yes, when and what did the pharmacy advise?)  Preferred Pharmacy (with phone number or street name):[facility] [address]  Agent: Please be advised that RX refills may take up to 3 business days. We ask that you follow-up with your pharmacy.

## 2018-09-06 NOTE — Assessment & Plan Note (Signed)
Klonopin as needed  Potential benefits of a long term benzodiazepines  use as well as potential risks  and complications were explained to the patient and were aknowledged.

## 2018-09-06 NOTE — Assessment & Plan Note (Signed)
Colchicine PRN

## 2018-09-06 NOTE — Assessment & Plan Note (Signed)
Norco as needed rare use  Potential benefits of a long term opioids use as well as potential risks (i.e. addiction risk, apnea etc) and complications (i.e. Somnolence, constipation and others) were explained to the patient and were aknowledged.

## 2018-09-06 NOTE — Assessment & Plan Note (Signed)
On OTC vitamin D now

## 2018-11-16 ENCOUNTER — Other Ambulatory Visit: Payer: Self-pay | Admitting: Internal Medicine

## 2019-02-18 ENCOUNTER — Encounter: Payer: 59 | Admitting: Internal Medicine

## 2019-02-18 ENCOUNTER — Other Ambulatory Visit: Payer: Self-pay | Admitting: Internal Medicine

## 2019-02-18 NOTE — Telephone Encounter (Signed)
Medication Refill - Medication: clonazePAM (KLONOPIN) 1 MG tablet   Pt has CPE sched for 02/27/2019, but is completely out of this medication.  Please refill until appt.   Preferred Pharmacy:  [id number] - [place], [place] - [address] [place] [phone] (Phone) [phone] (Fax)     Agent: Please be advised that RX refills may take up to 3 business days. We ask that you follow-up with your pharmacy.

## 2019-02-20 ENCOUNTER — Telehealth: Payer: Self-pay | Admitting: *Deleted

## 2019-02-20 NOTE — Telephone Encounter (Signed)
Copied from [place]. Topic: Appointment Scheduling - Scheduling Inquiry for Clinic >> Feb 18, 2019  1:18 PM Lennox Solders wrote: Reason for CRM: pt would like to have cpe labs in advance. Pt is sch for cpe on 02-27-2019

## 2019-02-20 NOTE — Telephone Encounter (Signed)
Lab orders previously placed. Patient's wife informed.

## 2019-02-22 MED ORDER — CLONAZEPAM 1 MG PO TABS: ORAL_TABLET | ORAL | 1 refills | Status: DC

## 2019-02-25 ENCOUNTER — Other Ambulatory Visit (INDEPENDENT_AMBULATORY_CARE_PROVIDER_SITE_OTHER): Payer: 59

## 2019-02-25 DIAGNOSIS — Z0000 Encounter for general adult medical examination without abnormal findings: Secondary | ICD-10-CM | POA: Diagnosis not present

## 2019-02-25 DIAGNOSIS — Z125 Encounter for screening for malignant neoplasm of prostate: Secondary | ICD-10-CM | POA: Diagnosis not present

## 2019-02-25 LAB — HEPATIC FUNCTION PANEL
ALT: 52 U/L (ref 0–53)
AST: 47 U/L — ABNORMAL HIGH (ref 0–37)
Albumin: 4.8 g/dL (ref 3.5–5.2)
Alkaline Phosphatase: 70 U/L (ref 39–117)
Bilirubin, Direct: 0.1 mg/dL (ref 0.0–0.3)
Total Bilirubin: 0.7 mg/dL (ref 0.2–1.2)
Total Protein: 7.4 g/dL (ref 6.0–8.3)

## 2019-02-25 LAB — BASIC METABOLIC PANEL
BUN: 17 mg/dL (ref 6–23)
CO2: 26 mEq/L (ref 19–32)
Calcium: 9.9 mg/dL (ref 8.4–10.5)
Chloride: 103 mEq/L (ref 96–112)
Creatinine, Ser: 0.82 mg/dL (ref 0.40–1.50)
GFR: 97.08 mL/min (ref >60.00)
Glucose, Bld: 96 mg/dL (ref 70–99)
Potassium: 4.3 mEq/L (ref 3.5–5.1)
Sodium: 140 mEq/L (ref 135–145)

## 2019-02-25 LAB — URINALYSIS
Bilirubin Urine: NEGATIVE
Hgb urine dipstick: NEGATIVE
Ketones, ur: NEGATIVE
Leukocytes,Ua: NEGATIVE
Nitrite: NEGATIVE
Specific Gravity, Urine: 1.02 (ref 1.000–1.030)
Total Protein, Urine: NEGATIVE
Urine Glucose: NEGATIVE
Urobilinogen, UA: 0.2 (ref 0.0–1.0)
pH: 5 (ref 5.0–8.0)

## 2019-02-25 LAB — CBC WITH DIFFERENTIAL/PLATELET
Basophils Absolute: 0.1 10*3/uL (ref 0.0–0.1)
Basophils Relative: 0.9 % (ref 0.0–3.0)
Eosinophils Absolute: 0.4 10*3/uL (ref 0.0–0.7)
Eosinophils Relative: 5.2 % — ABNORMAL HIGH (ref 0.0–5.0)
HCT: 41 % (ref 39.0–52.0)
Hemoglobin: 14 g/dL (ref 13.0–17.0)
Lymphocytes Relative: 26.9 % (ref 12.0–46.0)
Lymphs Abs: 1.8 10*3/uL (ref 0.7–4.0)
MCHC: 34.1 g/dL (ref 30.0–36.0)
MCV: 97.7 fl (ref 78.0–100.0)
Monocytes Absolute: 0.5 10*3/uL (ref 0.1–1.0)
Monocytes Relative: 6.8 % (ref 3.0–12.0)
Neutro Abs: 4 10*3/uL (ref 1.4–7.7)
Neutrophils Relative %: 60.2 % (ref 43.0–77.0)
Platelets: 155 10*3/uL (ref 150.0–400.0)
RBC: 4.19 Mil/uL — ABNORMAL LOW (ref 4.22–5.81)
RDW: 13.1 % (ref 11.5–15.5)
WBC: 6.7 10*3/uL (ref 4.0–10.5)

## 2019-02-25 LAB — LDL CHOLESTEROL, DIRECT: Direct LDL: 193 mg/dL

## 2019-02-25 LAB — PSA: PSA: 0.83 ng/mL (ref 0.10–4.00)

## 2019-02-25 LAB — LIPID PANEL
Cholesterol: 321 mg/dL — ABNORMAL HIGH (ref 0–200)
HDL: 40 mg/dL (ref >39.00)
Total CHOL/HDL Ratio: 8
Triglycerides: 517 mg/dL — ABNORMAL HIGH (ref 0.0–149.0)

## 2019-02-25 LAB — TSH: TSH: 2.24 u[IU]/mL (ref 0.35–4.50)

## 2019-02-27 ENCOUNTER — Ambulatory Visit (INDEPENDENT_AMBULATORY_CARE_PROVIDER_SITE_OTHER): Payer: 59 | Admitting: Internal Medicine

## 2019-02-27 ENCOUNTER — Other Ambulatory Visit: Payer: Self-pay

## 2019-02-27 ENCOUNTER — Encounter: Payer: Self-pay | Admitting: Internal Medicine

## 2019-02-27 VITALS — BP 144/84 | HR 87 | Temp 98.0°F | Ht 69.0 in | Wt 264.0 lb

## 2019-02-27 DIAGNOSIS — F419 Anxiety disorder, unspecified: Secondary | ICD-10-CM

## 2019-02-27 DIAGNOSIS — F1010 Alcohol abuse, uncomplicated: Secondary | ICD-10-CM

## 2019-02-27 DIAGNOSIS — R7989 Other specified abnormal findings of blood chemistry: Secondary | ICD-10-CM

## 2019-02-27 DIAGNOSIS — Z0000 Encounter for general adult medical examination without abnormal findings: Secondary | ICD-10-CM

## 2019-02-27 DIAGNOSIS — E785 Hyperlipidemia, unspecified: Secondary | ICD-10-CM

## 2019-02-27 DIAGNOSIS — E559 Vitamin D deficiency, unspecified: Secondary | ICD-10-CM

## 2019-02-27 DIAGNOSIS — E7849 Other hyperlipidemia: Secondary | ICD-10-CM

## 2019-02-27 MED ORDER — COLCHICINE 0.6 MG PO CAPS: ORAL_CAPSULE | ORAL | 3 refills | Status: DC

## 2019-02-27 MED ORDER — FENOFIBRATE 145 MG PO TABS: 145.0000 mg | ORAL_TABLET | Freq: Every day | ORAL | 3 refills | Status: DC

## 2019-02-27 MED ORDER — ATORVASTATIN CALCIUM 20 MG PO TABS: 20.0000 mg | ORAL_TABLET | Freq: Every day | ORAL | 3 refills | Status: DC

## 2019-02-27 MED ORDER — IRBESARTAN 300 MG PO TABS: 300.0000 mg | ORAL_TABLET | Freq: Every day | ORAL | 3 refills | Status: DC

## 2019-02-27 MED ORDER — TAMSULOSIN HCL 0.4 MG PO CAPS: 0.4000 mg | ORAL_CAPSULE | Freq: Every day | ORAL | 3 refills | Status: DC

## 2019-02-27 NOTE — Assessment & Plan Note (Signed)
Klonopin - occasional use prn - not w/alcohol  Potential benefits of a long term benzodiazepines  use as well as potential risks  and complications were explained to the patient and were aknowledged.

## 2019-02-27 NOTE — Assessment & Plan Note (Signed)
Pt stopped wt loss clinic Fasting discussed

## 2019-02-27 NOTE — Assessment & Plan Note (Signed)
Loose wt, reduce beer

## 2019-02-27 NOTE — Assessment & Plan Note (Signed)
We discussed age appropriate health related issues, including available/recomended screening tests and vaccinations. We discussed a need for adhering to healthy diet and exercise. Labs were ordered to be later reviewed . All questions were answered.   

## 2019-02-27 NOTE — Assessment & Plan Note (Signed)
Discussed.

## 2019-02-27 NOTE — Assessment & Plan Note (Signed)
Vit D OTC

## 2019-02-27 NOTE — Patient Instructions (Signed)
These suggestions will probably help you to improve your metabolism if you are not overweight and to lose weight if you are overweight: 1.  Reduce your consumption of sugars and starches.  Eliminate high fructose corn syrup from your diet.  Reduce your consumption of processed foods.  For desserts try to have seasonal fruits, berries, nuts, cheeses or dark chocolate with more than 70% cacao. 2.  Do not snack 3.  You do not have to eat breakfast.  If you choose to have breakfast-eat plain greek yogurt, eggs, oatmeal (without sugar) 4.  Drink water, freshly brewed unsweetened tea (green, black or herbal) or coffee.  Do not drink sodas including diet sodas , juices, beverages sweetened with artificial sweeteners. 5.  Reduce your consumption of refined grains. 6.  Avoid protein drinks such as Optifast, Slim fast etc. Eat chicken, fish, meat, dairy and beans for your sources of protein 7.  Natural unprocessed fats like cold pressed virgin olive oil, butter, coconut oil are good for you.  Eat avocados 8.  Increase your consumption of fiber.  Fruits, berries, vegetables, whole grains, flaxseeds, Chia seeds, beans, popcorn, nuts, oatmeal are good sources of fiber 9.  Use vinegar in your diet, i.e. apple cider vinegar, red wine or balsamic vinegar 10.  You can try fasting.  For example you can skip breakfast and lunch every other day (24-hour fast) 11.  Stress reduction, good night sleep, relaxation, meditation, yoga and other physical activity is likely to help you to maintain low weight too. 12.  If you drink alcohol, limit your alcohol intake to no more than 2 drinks a day.   Mediterranean diet is good for you. ([employer] has a typical Mediterranean cuisine menu) The Mediterranean diet is a way of eating based on the traditional cuisine of countries bordering the [place] Sea. While there is no single definition of the Mediterranean diet, it is typically high in vegetables, fruits, whole grains,  beans, nut and seeds, and olive oil. The main components of Mediterranean diet include: . Daily consumption of vegetables, fruits, whole grains and healthy fats  . Weekly intake of fish, poultry, beans and eggs  . Moderate portions of dairy products  . Limited intake of red meat Other important elements of the Mediterranean diet are sharing meals with family and friends, enjoying a glass of red wine and being physically active. Health benefits of a Mediterranean diet: A traditional Mediterranean diet consisting of large quantities of fresh fruits and vegetables, nuts, fish and olive oil-coupled with physical activity-can reduce your risk of serious mental and physical health problems by: Preventing heart disease and strokes. Following a Mediterranean diet limits your intake of refined breads, processed foods, and red meat, and encourages drinking red wine instead of hard liquor-all factors that can help prevent heart disease and stroke. Keeping you agile. If you're an older adult, the nutrients gained with a Mediterranean diet may reduce your risk of developing muscle weakness and other signs of frailty by about 70 percent. Reducing the risk of Alzheimer's. Research suggests that the Mediterranean diet may improve cholesterol, blood sugar levels, and overall blood vessel health, which in turn may reduce your risk of Alzheimer's disease or dementia. Halving the risk of Parkinson's disease. The high levels of antioxidants in the Mediterranean diet can prevent cells from undergoing a damaging process called oxidative stress, thereby cutting the risk of Parkinson's disease in half. Increasing longevity. By reducing your risk of developing heart disease or cancer with the Mediterranean diet,   you're reducing your risk of death at any age by 20%. Protecting against type 2 diabetes. A Mediterranean diet is rich in fiber which digests slowly, prevents huge swings in blood sugar, and can help you maintain a  healthy weight.    Cabbage soup recipe that will not make you gain weight: Take 1 small head of cabbage, 1 average pack of celery, 4 green peppers, 4 onions, 2 cans diced tomatoes (they are not available without salt), salt and spices to taste.  Chop cabbage, celery, peppers and onions.  And tomatoes and 2-2.5 liters (2.5 quarts) of water so that it would just cover the vegetables.  Bring to boil.  Add spices and salt.  Turn heat to low/medium and simmer for 20-25 minutes.  Naturally, you can make a smaller batch and change some of the ingredients.   Cardiac CT calcium scoring test $150   Computed tomography, more commonly known as a CT or CAT scan, is a diagnostic medical imaging test. Like traditional x-rays, it produces multiple images or pictures of the inside of the body. The cross-sectional images generated during a CT scan can be reformatted in multiple planes. They can even generate three-dimensional images. These images can be viewed on a computer monitor, printed on film or by a 3D printer, or transferred to a CD or DVD. CT images of internal organs, bones, soft tissue and blood vessels provide greater detail than traditional x-rays, particularly of soft tissues and blood vessels. A cardiac CT scan for coronary calcium is a non-invasive way of obtaining information about the presence, location and extent of calcified plaque in the coronary arteries-the vessels that supply oxygen-containing blood to the heart muscle. Calcified plaque results when there is a build-up of fat and other substances under the inner layer of the artery. This material can calcify which signals the presence of atherosclerosis, a disease of the vessel wall, also called coronary artery disease (CAD). People with this disease have an increased risk for heart attacks. In addition, over time, progression of plaque build up (CAD) can narrow the arteries or even close off blood flow to the heart. The result may be chest pain,  sometimes called "angina," or a heart attack. Because calcium is a marker of CAD, the amount of calcium detected on a cardiac CT scan is a helpful prognostic tool. The findings on cardiac CT are expressed as a calcium score. Another name for this test is coronary artery calcium scoring.  What are some common uses of the procedure? The goal of cardiac CT scan for calcium scoring is to determine if CAD is present and to what extent, even if there are no symptoms. It is a screening study that may be recommended by a physician for patients with risk factors for CAD but no clinical symptoms. The major risk factors for CAD are: . high blood cholesterol levels  . family history of heart attacks  . diabetes  . high blood pressure  . cigarette smoking  . overweight or obese  . physical inactivity   A negative cardiac CT scan for calcium scoring shows no calcification within the coronary arteries. This suggests that CAD is absent or so minimal it cannot be seen by this technique. The chance of having a heart attack over the next two to five years is very low under these circumstances. A positive test means that CAD is present, regardless of whether or not the patient is experiencing any symptoms. The amount of calcification-expressed as the calcium score-may   help to predict the likelihood of a myocardial infarction (heart attack) in the coming years and helps your medical doctor or cardiologist decide whether the patient may need to take preventive medicine or undertake other measures such as diet and exercise to lower the risk for heart attack. The extent of CAD is graded according to your calcium score:  Calcium Score  Presence of CAD (coronary artery disease)  0 No evidence of CAD   1-10 Minimal evidence of CAD  11-100 Mild evidence of CAD  101-400 Moderate evidence of CAD  Over 400 Extensive evidence of CAD    

## 2019-02-27 NOTE — Progress Notes (Signed)
Subjective:  Patient ID: Christian [name], male    DOB: 11/19/1960  Age: 58 y.o. MRN: [id number]  CC: No chief complaint on file.   HPI Christian [name] presents for Well exam C/o wt gain F/u anxiety, HTN, BPH, dyslipidemia Not taking some of his meds  Outpatient Medications Prior to Visit  Medication Sig Dispense Refill  . Cholecalciferol (VITAMIN D3) 2000 units capsule Take 1 capsule (2,000 Units total) by mouth daily. 100 capsule 3  . clonazePAM (KLONOPIN) 1 MG tablet TAKE 1 TABLET BY MOUTH TWICE DAILY AS NEEDED FOR  ANXIETY 60 tablet 1  . Colchicine 0.6 MG CAPS 1 po qd prn gout attack 30 capsule 5  . fenofibrate (TRICOR) 145 MG tablet Take 1 tablet (145 mg total) by mouth daily. 30 tablet 0  . HYDROcodone-acetaminophen (NORCO) 7.5-325 MG tablet Take 0.5-1 tablets by mouth every 6 (six) hours as needed for moderate pain or severe pain. 40 tablet 0  . indomethacin (INDOCIN) 50 MG capsule TAKE 1 CAPSULE BY MOUTH THREE TIMES DAILY AS NEEDED FOR  MODERATE  PAIN 30 capsule 0  . irbesartan (AVAPRO) 300 MG tablet Take 1 tablet (300 mg total) by mouth daily. 90 tablet 3  . tamsulosin (FLOMAX) 0.4 MG CAPS capsule Take 0.4 mg by mouth daily after supper.     No facility-administered medications prior to visit.     ROS: Review of Systems  Constitutional: Positive for fatigue. Negative for appetite change and unexpected weight change.  HENT: Negative for congestion, nosebleeds, sneezing, sore throat and trouble swallowing.   Eyes: Negative for itching and visual disturbance.  Respiratory: Negative for cough.   Cardiovascular: Negative for chest pain, palpitations and leg swelling.  Gastrointestinal: Negative for abdominal distention, blood in stool, diarrhea and nausea.  Genitourinary: Negative for frequency and hematuria.  Musculoskeletal: Negative for back pain, gait problem, joint swelling and neck pain.  Skin: Negative for rash.  Neurological: Negative for dizziness, tremors, speech  difficulty and weakness.  Psychiatric/Behavioral: Negative for agitation, dysphoric mood and sleep disturbance. The patient is nervous/anxious.     Objective:  BP (!) 144/84 (BP Location: Left Arm, Patient Position: Sitting, Cuff Size: Large)   Pulse 87   Temp 98 F (36.7 C) (Oral)   Ht 5\' 9"  (1.753 m)   Wt 264 lb (119.7 kg)   SpO2 96%   BMI 38.99 kg/m   BP Readings from Last 3 Encounters:  02/27/19 (!) 144/84  12/27/17 105/62  12/06/17 (!) 96/56    Wt Readings from Last 3 Encounters:  02/27/19 264 lb (119.7 kg)  12/27/17 223 lb (101.2 kg)  12/06/17 230 lb (104.3 kg)    Physical Exam Constitutional:      General: He is not in acute distress.    Appearance: He is well-developed. He is obese.     Comments: NAD  Eyes:     Conjunctiva/sclera: Conjunctivae normal.     Pupils: Pupils are equal, round, and reactive to light.  Neck:     Musculoskeletal: Normal range of motion.     Thyroid: No thyromegaly.     Vascular: No JVD.  Cardiovascular:     Rate and Rhythm: Normal rate and regular rhythm.     Heart sounds: Normal heart sounds. No murmur. No friction rub. No gallop.   Pulmonary:     Effort: Pulmonary effort is normal. No respiratory distress.     Breath sounds: Normal breath sounds. No wheezing or rales.  Chest:  Chest wall: No tenderness.  Abdominal:     General: Bowel sounds are normal. There is no distension.     Palpations: Abdomen is soft. There is no mass.     Tenderness: There is no abdominal tenderness. There is no guarding or rebound.  Musculoskeletal: Normal range of motion.        General: No tenderness.  Lymphadenopathy:     Cervical: No cervical adenopathy.  Skin:    General: Skin is warm and dry.     Findings: No rash.  Neurological:     Mental Status: He is alert and oriented to person, place, and time.     Cranial Nerves: No cranial nerve deficit.     Motor: No abnormal muscle tone.     Coordination: Coordination normal.     Gait: Gait  normal.     Deep Tendon Reflexes: Reflexes are normal and symmetric.  Psychiatric:        Behavior: Behavior normal.        Thought Content: Thought content normal.        Judgment: Judgment normal.     Lab Results  Component Value Date   WBC 6.7 02/25/2019   HGB 14.0 02/25/2019   HCT 41.0 02/25/2019   PLT 155.0 02/25/2019   GLUCOSE 96 02/25/2019   CHOL 321 (H) 02/25/2019   TRIG (H) 02/25/2019    517.0 Triglyceride is over 400; calculations on Lipids are invalid.   HDL 40.00 02/25/2019   LDLDIRECT 193.0 02/25/2019   LDLCALC Comment 10/18/2017   ALT 52 02/25/2019   AST 47 (H) 02/25/2019   NA 140 02/25/2019   K 4.3 02/25/2019   CL 103 02/25/2019   CREATININE 0.82 02/25/2019   BUN 17 02/25/2019   CO2 26 02/25/2019   TSH 2.24 02/25/2019   PSA 0.83 02/25/2019   INR 1.01 03/28/2012   HGBA1C 5.6 10/18/2017    No results found.  Assessment & Plan:   There are no diagnoses linked to this encounter.   No orders of the defined types were placed in this encounter.    Follow-up: No follow-ups on file.  Walker Kehr, MD

## 2019-02-27 NOTE — Assessment & Plan Note (Addendum)
Worse On Lipitor?? Off Fenofibrate

## 2019-03-25 ENCOUNTER — Other Ambulatory Visit: Payer: Self-pay

## 2019-03-25 ENCOUNTER — Ambulatory Visit (INDEPENDENT_AMBULATORY_CARE_PROVIDER_SITE_OTHER)
Admission: RE | Admit: 2019-03-25 | Discharge: 2019-03-25 | Disposition: A | Discharge status: Home / Self Care | Payer: Self-pay | Source: Ambulatory Visit | Attending: Internal Medicine | Admitting: Internal Medicine

## 2019-03-25 DIAGNOSIS — E785 Hyperlipidemia, unspecified: Secondary | ICD-10-CM

## 2019-03-25 DIAGNOSIS — E7849 Other hyperlipidemia: Secondary | ICD-10-CM

## 2019-03-26 ENCOUNTER — Encounter: Payer: Self-pay | Admitting: Internal Medicine

## 2019-03-26 DIAGNOSIS — I2510 Atherosclerotic heart disease of native coronary artery without angina pectoris: Secondary | ICD-10-CM | POA: Insufficient documentation

## 2019-04-02 ENCOUNTER — Other Ambulatory Visit: Payer: Self-pay | Admitting: Internal Medicine

## 2019-04-02 MED ORDER — INDOMETHACIN 50 MG PO CAPS: ORAL_CAPSULE | ORAL | 0 refills | Status: DC

## 2019-04-02 NOTE — Telephone Encounter (Signed)
Medication Refill - Medication: indomethacin (INDOCIN) 50 MG capsule    Has the patient contacted their pharmacy? Yes.   PTs wife states pt has been out of this medication for 2 weeks. Please advise.  (Agent: If no, request that the patient contact the pharmacy for the refill.) (Agent: If yes, when and what did the pharmacy advise?)  Preferred Pharmacy (with phone number or street name):  [id number] - [place], [place] - [address]  [address] [place] [address]  Phone: [phone] Fax: [phone]  Not a 24 hour pharmacy; exact hours not known.     Agent: Please be advised that RX refills may take up to 3 business days. We ask that you follow-up with your pharmacy.

## 2019-05-14 ENCOUNTER — Other Ambulatory Visit: Payer: Self-pay | Admitting: Internal Medicine

## 2019-06-04 ENCOUNTER — Ambulatory Visit: Payer: 59 | Admitting: Internal Medicine

## 2019-08-25 ENCOUNTER — Other Ambulatory Visit: Payer: Self-pay | Admitting: Internal Medicine

## 2019-10-29 ENCOUNTER — Other Ambulatory Visit: Payer: Self-pay

## 2019-10-29 ENCOUNTER — Ambulatory Visit: Payer: 59 | Admitting: Internal Medicine

## 2019-10-29 ENCOUNTER — Encounter: Payer: Self-pay | Admitting: Internal Medicine

## 2019-10-29 VITALS — BP 180/110 | HR 68 | Temp 98.4°F | Ht 69.0 in | Wt 268.0 lb

## 2019-10-29 DIAGNOSIS — M2550 Pain in unspecified joint: Secondary | ICD-10-CM

## 2019-10-29 DIAGNOSIS — R202 Paresthesia of skin: Secondary | ICD-10-CM

## 2019-10-29 DIAGNOSIS — I2583 Coronary atherosclerosis due to lipid rich plaque: Secondary | ICD-10-CM

## 2019-10-29 DIAGNOSIS — R5383 Other fatigue: Secondary | ICD-10-CM | POA: Diagnosis not present

## 2019-10-29 DIAGNOSIS — I10 Essential (primary) hypertension: Secondary | ICD-10-CM

## 2019-10-29 DIAGNOSIS — Z6839 Body mass index (BMI) 39.0-39.9, adult: Secondary | ICD-10-CM

## 2019-10-29 DIAGNOSIS — E559 Vitamin D deficiency, unspecified: Secondary | ICD-10-CM | POA: Diagnosis not present

## 2019-10-29 DIAGNOSIS — R739 Hyperglycemia, unspecified: Secondary | ICD-10-CM | POA: Diagnosis not present

## 2019-10-29 DIAGNOSIS — I2510 Atherosclerotic heart disease of native coronary artery without angina pectoris: Secondary | ICD-10-CM

## 2019-10-29 LAB — LIPID PANEL
Cholesterol: 223 mg/dL — ABNORMAL HIGH (ref 0–200)
HDL: 34.4 mg/dL — ABNORMAL LOW (ref >39.00)
NonHDL: 188.97
Total CHOL/HDL Ratio: 6
Triglycerides: 232 mg/dL — ABNORMAL HIGH (ref 0.0–149.0)
VLDL: 46.4 mg/dL — ABNORMAL HIGH (ref 0.0–40.0)

## 2019-10-29 LAB — BASIC METABOLIC PANEL
BUN: 19 mg/dL (ref 6–23)
CO2: 28 mEq/L (ref 19–32)
Calcium: 9.9 mg/dL (ref 8.4–10.5)
Chloride: 101 mEq/L (ref 96–112)
Creatinine, Ser: 0.88 mg/dL (ref 0.40–1.50)
GFR: 89.27 mL/min (ref >60.00)
Glucose, Bld: 109 mg/dL — ABNORMAL HIGH (ref 70–99)
Potassium: 4.3 mEq/L (ref 3.5–5.1)
Sodium: 136 mEq/L (ref 135–145)

## 2019-10-29 LAB — HEPATIC FUNCTION PANEL
ALT: 41 U/L (ref 0–53)
AST: 35 U/L (ref 0–37)
Albumin: 5 g/dL (ref 3.5–5.2)
Alkaline Phosphatase: 53 U/L (ref 39–117)
Bilirubin, Direct: 0.1 mg/dL (ref 0.0–0.3)
Total Bilirubin: 0.7 mg/dL (ref 0.2–1.2)
Total Protein: 7.4 g/dL (ref 6.0–8.3)

## 2019-10-29 LAB — CBC WITH DIFFERENTIAL/PLATELET
Basophils Absolute: 0.1 10*3/uL (ref 0.0–0.1)
Basophils Relative: 0.8 % (ref 0.0–3.0)
Eosinophils Absolute: 0.3 10*3/uL (ref 0.0–0.7)
Eosinophils Relative: 3.9 % (ref 0.0–5.0)
HCT: 38.7 % — ABNORMAL LOW (ref 39.0–52.0)
Hemoglobin: 13.3 g/dL (ref 13.0–17.0)
Lymphocytes Relative: 20.3 % (ref 12.0–46.0)
Lymphs Abs: 1.7 10*3/uL (ref 0.7–4.0)
MCHC: 34.4 g/dL (ref 30.0–36.0)
MCV: 94.7 fl (ref 78.0–100.0)
Monocytes Absolute: 0.6 10*3/uL (ref 0.1–1.0)
Monocytes Relative: 7 % (ref 3.0–12.0)
Neutro Abs: 5.6 10*3/uL (ref 1.4–7.7)
Neutrophils Relative %: 68 % (ref 43.0–77.0)
Platelets: 153 10*3/uL (ref 150.0–400.0)
RBC: 4.09 Mil/uL — ABNORMAL LOW (ref 4.22–5.81)
RDW: 13.5 % (ref 11.5–15.5)
WBC: 8.2 10*3/uL (ref 4.0–10.5)

## 2019-10-29 LAB — CK: Total CK: 96 U/L (ref 7–232)

## 2019-10-29 LAB — PSA: PSA: 0.92 ng/mL (ref 0.10–4.00)

## 2019-10-29 LAB — URINALYSIS
Bilirubin Urine: NEGATIVE
Hgb urine dipstick: NEGATIVE
Ketones, ur: NEGATIVE
Leukocytes,Ua: NEGATIVE
Nitrite: NEGATIVE
Specific Gravity, Urine: 1.025 (ref 1.000–1.030)
Total Protein, Urine: NEGATIVE
Urine Glucose: NEGATIVE
Urobilinogen, UA: 0.2 (ref 0.0–1.0)
pH: 5.5 (ref 5.0–8.0)

## 2019-10-29 LAB — VITAMIN D 25 HYDROXY (VIT D DEFICIENCY, FRACTURES): VITD: 37.7 ng/mL (ref 30.00–100.00)

## 2019-10-29 LAB — SEDIMENTATION RATE: Sed Rate: 10 mm/hr (ref 0–20)

## 2019-10-29 LAB — VITAMIN B12: Vitamin B-12: 1013 pg/mL — ABNORMAL HIGH (ref 211–911)

## 2019-10-29 LAB — TESTOSTERONE: Testosterone: 145.94 ng/dL — ABNORMAL LOW (ref 300.00–890.00)

## 2019-10-29 LAB — CORTISOL: Cortisol, Plasma: 14 ug/dL

## 2019-10-29 LAB — HEMOGLOBIN A1C: Hgb A1c MFr Bld: 6.4 % (ref 4.6–6.5)

## 2019-10-29 LAB — TSH: TSH: 3.01 u[IU]/mL (ref 0.35–4.50)

## 2019-10-29 LAB — LDL CHOLESTEROL, DIRECT: Direct LDL: 153 mg/dL

## 2019-10-29 MED ORDER — HYDROCODONE-ACETAMINOPHEN 7.5-325 MG PO TABS: 0.5000 | ORAL_TABLET | Freq: Four times a day (QID) | ORAL | 0 refills | Status: DC | PRN

## 2019-10-29 MED ORDER — CLONAZEPAM 1 MG PO TABS: ORAL_TABLET | ORAL | 1 refills | Status: DC

## 2019-10-29 MED ORDER — AMLODIPINE BESYLATE 5 MG PO TABS
5.0000 mg | ORAL_TABLET | Freq: Every day | ORAL | 3 refills | Status: DC
Start: 2019-10-29 — End: 2020-10-12

## 2019-10-29 NOTE — Assessment & Plan Note (Signed)
Worse  Discussed  

## 2019-10-29 NOTE — Assessment & Plan Note (Signed)
A1c

## 2019-10-29 NOTE — Assessment & Plan Note (Signed)
BP control On Lipitor, ASA 81

## 2019-10-29 NOTE — Patient Instructions (Addendum)
Hold Lipitor x 2-3 weeks Re-start Amlodipine   These suggestions will probably help you to improve your metabolism if you are not overweight and to lose weight if you are overweight: 1.  Reduce your consumption of sugars and starches.  Eliminate high fructose corn syrup from your diet.  Reduce your consumption of processed foods.  For desserts try to have seasonal fruits, berries, nuts, cheeses or dark chocolate with more than 70% cacao. 2.  Do not snack 3.  You do not have to eat breakfast.  If you choose to have breakfast-eat plain greek yogurt, eggs, oatmeal (without sugar) 4.  Drink water, freshly brewed unsweetened tea (green, black or herbal) or coffee.  Do not drink sodas including diet sodas , juices, beverages sweetened with artificial sweeteners. 5.  Reduce your consumption of refined grains. 6.  Avoid protein drinks such as Optifast, Slim fast etc. Eat chicken, fish, meat, dairy and beans for your sources of protein 7.  Natural unprocessed fats like cold pressed virgin olive oil, butter, coconut oil are good for you.  Eat avocados 8.  Increase your consumption of fiber.  Fruits, berries, vegetables, whole grains, flaxseeds, Chia seeds, beans, popcorn, nuts, oatmeal are good sources of fiber 9.  Use vinegar in your diet, i.e. apple cider vinegar, red wine or balsamic vinegar 10.  You can try fasting.  For example you can skip breakfast and lunch every other day (24-hour fast) 11.  Stress reduction, good night sleep, relaxation, meditation, yoga and other physical activity is likely to help you to maintain low weight too. 12.  If you drink alcohol, limit your alcohol intake to no more than 2 drinks a day.

## 2019-10-29 NOTE — Assessment & Plan Note (Addendum)
R/o OSA - refused a w/up

## 2019-10-29 NOTE — Assessment & Plan Note (Signed)
Worse Irbesartan Re-start Amlodipine

## 2019-10-29 NOTE — Assessment & Plan Note (Signed)
Vit D 

## 2019-10-29 NOTE — Assessment & Plan Note (Signed)
Hold Lipitor Labs Norco prn rare  Potential benefits of a long term opioids use as well as potential risks (i.e. addiction risk, apnea etc) and complications (i.e. Somnolence, constipation and others) were explained to the patient and were aknowledged.

## 2019-10-29 NOTE — Progress Notes (Signed)
Subjective:  Patient ID: Christian [name], male    DOB: 1961/01/06  Age: 59 y.o. MRN: [id number]  CC: No chief complaint on file.   HPI [name] presents for fatigue, dizziness, achy legs, toes are tingly - last year.. Refused COVID 19 shot Not taking Amlodipine  Outpatient Medications Prior to Visit  Medication Sig Dispense Refill  . atorvastatin (LIPITOR) 20 MG tablet Take 1 tablet (20 mg total) by mouth daily. 90 tablet 3  . Cholecalciferol (VITAMIN D3) 2000 units capsule Take 1 capsule (2,000 Units total) by mouth daily. 100 capsule 3  . clonazePAM (KLONOPIN) 1 MG tablet TAKE 1 TABLET BY MOUTH TWICE DAILY AS NEEDED FOR  ANXIETY 60 tablet 1  . Colchicine 0.6 MG CAPS 1 po qd prn gout attack 90 capsule 3  . fenofibrate (TRICOR) 145 MG tablet Take 1 tablet (145 mg total) by mouth daily. 90 tablet 3  . HYDROcodone-acetaminophen (NORCO) 7.5-325 MG tablet Take 0.5-1 tablets by mouth every 6 (six) hours as needed for moderate pain or severe pain. 40 tablet 0  . indomethacin (INDOCIN) 50 MG capsule Take 1 capsule (50 mg total) by mouth 3 (three) times daily as needed for moderate pain. Office visit needed before refills will be given 30 capsule 0  . irbesartan (AVAPRO) 300 MG tablet Take 1 tablet (300 mg total) by mouth daily. 90 tablet 3  . tamsulosin (FLOMAX) 0.4 MG CAPS capsule Take 1 capsule (0.4 mg total) by mouth daily after supper. 90 capsule 3   No facility-administered medications prior to visit.    ROS: Review of Systems  Constitutional: Positive for fatigue and unexpected weight change. Negative for appetite change.  HENT: Negative for congestion, nosebleeds, sneezing, sore throat and trouble swallowing.   Eyes: Negative for itching and visual disturbance.  Respiratory: Negative for cough.   Cardiovascular: Negative for chest pain, palpitations and leg swelling.  Gastrointestinal: Negative for abdominal distention, blood in stool, diarrhea and nausea.  Genitourinary:  Negative for frequency and hematuria.  Musculoskeletal: Positive for arthralgias, back pain and gait problem. Negative for joint swelling and neck pain.  Skin: Negative for rash.  Neurological: Positive for dizziness, weakness, numbness and headaches. Negative for tremors and speech difficulty.  Psychiatric/Behavioral: Positive for sleep disturbance. Negative for agitation, dysphoric mood and suicidal ideas. The patient is not nervous/anxious.     Objective:  BP (!) 180/110 (BP Location: Right Arm, Patient Position: Sitting, Cuff Size: Large)   Pulse 68   Temp 98.4 F (36.9 C) (Oral)   Ht 5\' 9"  (1.753 m)   Wt 268 lb (121.6 kg)   SpO2 99%   BMI 39.58 kg/m   BP Readings from Last 3 Encounters:  10/29/19 (!) 180/110  02/27/19 (!) 144/84  12/27/17 105/62    Wt Readings from Last 3 Encounters:  10/29/19 268 lb (121.6 kg)  02/27/19 264 lb (119.7 kg)  12/27/17 223 lb (101.2 kg)    Physical Exam Constitutional:      General: He is not in acute distress.    Appearance: He is well-developed. He is obese.     Comments: NAD  Eyes:     Conjunctiva/sclera: Conjunctivae normal.     Pupils: Pupils are equal, round, and reactive to light.  Neck:     Thyroid: No thyromegaly.     Vascular: No JVD.  Cardiovascular:     Rate and Rhythm: Normal rate and regular rhythm.     Heart sounds: Normal heart sounds. No murmur heard.  No friction rub. No gallop.   Pulmonary:     Effort: Pulmonary effort is normal. No respiratory distress.     Breath sounds: Normal breath sounds. No wheezing or rales.  Chest:     Chest wall: No tenderness.  Abdominal:     General: Bowel sounds are normal. There is no distension.     Palpations: Abdomen is soft. There is no mass.     Tenderness: There is no abdominal tenderness. There is no guarding or rebound.  Musculoskeletal:        General: Tenderness present. Normal range of motion.     Cervical back: Normal range of motion.  Lymphadenopathy:      Cervical: No cervical adenopathy.  Skin:    General: Skin is warm and dry.     Findings: No rash.  Neurological:     Mental Status: He is alert and oriented to person, place, and time.     Cranial Nerves: No cranial nerve deficit.     Motor: No abnormal muscle tone.     Coordination: Coordination normal.     Gait: Gait normal.     Deep Tendon Reflexes: Reflexes are normal and symmetric.  Psychiatric:        Behavior: Behavior normal.        Thought Content: Thought content normal.        Judgment: Judgment normal.     Lab Results  Component Value Date   WBC 6.7 02/25/2019   HGB 14.0 02/25/2019   HCT 41.0 02/25/2019   PLT 155.0 02/25/2019   GLUCOSE 96 02/25/2019   CHOL 321 (H) 02/25/2019   TRIG (H) 02/25/2019    517.0 Triglyceride is over 400; calculations on Lipids are invalid.   HDL 40.00 02/25/2019   LDLDIRECT 193.0 02/25/2019   LDLCALC Comment 10/18/2017   ALT 52 02/25/2019   AST 47 (H) 02/25/2019   NA 140 02/25/2019   K 4.3 02/25/2019   CL 103 02/25/2019   CREATININE 0.82 02/25/2019   BUN 17 02/25/2019   CO2 26 02/25/2019   TSH 2.24 02/25/2019   PSA 0.83 02/25/2019   INR 1.01 03/28/2012   HGBA1C 5.6 10/18/2017    CT CARDIAC SCORING  Addendum Date: 03/25/2019   ADDENDUM REPORT: 03/25/2019 10:43 ADDENDUM: OVER-READ INTERPRETATION  CT CHEST The following report is an over-read performed by radiologist Dr. Forest Gleason [facility] on 03/25/2019. This over-read does not include interpretation of cardiac or coronary anatomy or pathology. The coronary calcium interpretation by the cardiologist is attached. COMPARISON: 11/30/2011 chest CT. FINDINGS: Vascular: Aortic atherosclerosis. Mediastinum/Nodes: No imaged thoracic adenopathy. Tiny hiatal hernia. The distal esophagus is moderately thick walled, including on 56/4. Lungs/Pleura: No imaged pleural fluid. Bibasilar scarring or subsegmental atelectasis. Minimal motion degradation in the lower chest. Posterior  right lower lobe and right lower lobe calcified granulomas. Upper Abdomen: Mild hepatic steatosis. Normal imaged portions of the spleen. Musculoskeletal: Remote right rib fractures. IMPRESSION: 1.  No acute findings in the imaged extracardiac chest. 2.  Aortic Atherosclerosis (ICD10-I70.0). 3. Tiny hiatal hernia. Distal esophageal wall thickening, suggesting esophagitis. 4. Mild hepatic steatosis. Electronically Signed   By: Abigail Miyamoto M.D.   On: 03/25/2019 10:43   Result Date: 03/25/2019 CLINICAL DATA:  Risk stratification EXAM: Coronary Calcium Score TECHNIQUE: The patient was scanned on a [employer]. Axial non-contrast 3 mm slices were carried out through the heart. The data set was analyzed on a dedicated work station and scored using the [employer]. FINDINGS:  Non-cardiac: See separate report from [facility]. Ascending Aorta: Normal Caliber. Calcifications of the aortic root and ascending aorta. Pericardium: Normal Coronary arteries: Normal coronary origins with coronary calcifications in the RCA and LAD. IMPRESSION: Coronary calcium score of 61. This was 71st percentile for age and sex matched control. Fransico Him Electronically Signed: By: Fransico Him On: 03/25/2019 09:03    Assessment & Plan:    Walker Kehr, MD

## 2019-11-01 ENCOUNTER — Encounter: Payer: Self-pay | Admitting: Internal Medicine

## 2019-11-01 DIAGNOSIS — E291 Testicular hypofunction: Secondary | ICD-10-CM | POA: Insufficient documentation

## 2019-12-10 ENCOUNTER — Ambulatory Visit: Payer: 59 | Admitting: Internal Medicine

## 2019-12-10 ENCOUNTER — Other Ambulatory Visit: Payer: Self-pay

## 2019-12-10 ENCOUNTER — Encounter: Payer: Self-pay | Admitting: Internal Medicine

## 2019-12-10 DIAGNOSIS — E291 Testicular hypofunction: Secondary | ICD-10-CM

## 2019-12-10 DIAGNOSIS — E559 Vitamin D deficiency, unspecified: Secondary | ICD-10-CM | POA: Diagnosis not present

## 2019-12-10 DIAGNOSIS — M2550 Pain in unspecified joint: Secondary | ICD-10-CM | POA: Diagnosis not present

## 2019-12-10 DIAGNOSIS — M109 Gout, unspecified: Secondary | ICD-10-CM

## 2019-12-10 NOTE — Assessment & Plan Note (Signed)
Possible cause is gout

## 2019-12-10 NOTE — Patient Instructions (Addendum)
Lion's mane mushroom for memory    There are natural ways to boost your testosterone:  1. Lose Weight If you're overweight, shedding the excess pounds may increase your testosterone levels, according to multiple research. Overweight men are more likely to have low testosterone levels to begin with, so this is an important trick to increase your body's testosterone production when you need it most.   2. Strength Training    Strength training is also known to boost testosterone levels, provided you are doing so intensely enough. When strength training to boost testosterone, you'll want to increase the weight and lower your number of reps, and then focus on exercises that work a large number of muscles.  3. Optimize Your Vitamin D Levels Vitamin D, a steroid hormone, is essential for the healthy development of the nucleus of the sperm cell, and helps maintain semen quality and sperm count. Vitamin D also increases levels of testosterone, which may boost libido. In one study, overweight men who were given vitamin D supplements had a significant increase in testosterone levels after one year.  4. Reduce Stress When you're under a lot of stress, your body releases high levels of the stress hormone cortisol. This hormone actually blocks the effects of testosterone, presumably because, from a biological standpoint, testosterone-associated behaviors (mating, competing, aggression) may have lowered your chances of survival in an emergency (hence, the "fight or flight" response is dominant, courtesy of cortisol).  5. Limit or Eliminate Sugar from Your Diet Testosterone levels decrease after you eat sugar, which is likely because the sugar leads to a high insulin level, another factor leading to low testosterone.  6. Eat Healthy Fats By healthy, this means not only mon- and polyunsaturated fats, like that found in avocadoes and nuts, but also saturated, as these are essential for building testosterone.  Research shows that a diet with less than 40 percent of energy as fat (and that mainly from animal sources, i.e. saturated) lead to a decrease in testosterone levels.  It's important to understand that your body requires saturated fats from animal and vegetable sources (such as meat, dairy, certain oils, and tropical plants like coconut) for optimal functioning, and if you neglect this important food group in favor of sugar, grains and other starchy carbs, your health and weight are almost guaranteed to suffer. Examples of healthy fats you can eat more of to give your testosterone levels a boost include:  Olives and Olive oil  Coconuts and coconut oil Butter made from organic milk  Raw nuts, such as, almonds or pecans Eggs Avocados   Meats Palm oil Unheated organic nut oils   7. "Testosterone boosters" containing Vitamin D-3, Niacin, Vitamin B-6, Vitamin B-12, Magnesium, Zinc, Selenium, D-Aspartic Acid, Fenugreed Seed Extract, Oystershell, [place] Extract, [place] Ginseng may be helpful as well.

## 2019-12-10 NOTE — Assessment & Plan Note (Addendum)
Better w/reduced alcohol Pt refused daily allopurinol

## 2019-12-10 NOTE — Assessment & Plan Note (Addendum)
Vit D 

## 2019-12-10 NOTE — Assessment & Plan Note (Signed)
Options discussed 

## 2019-12-10 NOTE — Progress Notes (Signed)
Subjective:  Patient ID: Christian [name], male    DOB: 1961-02-05  Age: 59 y.o. MRN: [id number]  CC: No chief complaint on file.   HPI Christian [name] presents for HTN, fatigue, dyslipidemia and hypogonadism. Pt lost 5 lbs. Arthralgia is better after he started to drink less.  Outpatient Medications Prior to Visit  Medication Sig Dispense Refill  . amLODipine (NORVASC) 5 MG tablet Take 1 tablet (5 mg total) by mouth daily. 90 tablet 3  . atorvastatin (LIPITOR) 20 MG tablet Take 1 tablet (20 mg total) by mouth daily. 90 tablet 3  . Cholecalciferol (VITAMIN D3) 2000 units capsule Take 1 capsule (2,000 Units total) by mouth daily. 100 capsule 3  . clonazePAM (KLONOPIN) 1 MG tablet TAKE 1 TABLET BY MOUTH TWICE DAILY AS NEEDED FOR  ANXIETY 60 tablet 1  . Colchicine 0.6 MG CAPS 1 po qd prn gout attack 90 capsule 3  . fenofibrate (TRICOR) 145 MG tablet Take 1 tablet (145 mg total) by mouth daily. 90 tablet 3  . HYDROcodone-acetaminophen (NORCO) 7.5-325 MG tablet Take 0.5-1 tablets by mouth every 6 (six) hours as needed for moderate pain or severe pain. 40 tablet 0  . indomethacin (INDOCIN) 50 MG capsule Take 1 capsule (50 mg total) by mouth 3 (three) times daily as needed for moderate pain. Office visit needed before refills will be given 30 capsule 0  . irbesartan (AVAPRO) 300 MG tablet Take 1 tablet (300 mg total) by mouth daily. 90 tablet 3  . tamsulosin (FLOMAX) 0.4 MG CAPS capsule Take 1 capsule (0.4 mg total) by mouth daily after supper. 90 capsule 3   No facility-administered medications prior to visit.    ROS: Review of Systems  Constitutional: Positive for fatigue. Negative for appetite change and unexpected weight change.  HENT: Negative for congestion, nosebleeds, sneezing, sore throat and trouble swallowing.   Eyes: Negative for itching and visual disturbance.  Respiratory: Negative for cough.   Cardiovascular: Negative for chest pain, palpitations and leg swelling.    Gastrointestinal: Negative for abdominal distention, blood in stool, diarrhea and nausea.  Genitourinary: Negative for frequency and hematuria.  Musculoskeletal: Negative for back pain, gait problem, joint swelling and neck pain.  Skin: Negative for rash.  Neurological: Negative for dizziness, tremors, speech difficulty and weakness.  Psychiatric/Behavioral: Negative for agitation, dysphoric mood and sleep disturbance. The patient is not nervous/anxious.     Objective:  BP (!) 160/86 (BP Location: Right Arm, Patient Position: Sitting, Cuff Size: Large)   Pulse 73   Temp 98.6 F (37 C) (Oral)   Ht 5\' 9"  (1.753 m)   Wt (!) 263 lb (119.3 kg)   SpO2 97%   BMI 38.84 kg/m   BP Readings from Last 3 Encounters:  12/10/19 (!) 160/86  10/29/19 (!) 180/110  02/27/19 (!) 144/84    Wt Readings from Last 3 Encounters:  12/10/19 (!) 263 lb (119.3 kg)  10/29/19 268 lb (121.6 kg)  02/27/19 264 lb (119.7 kg)    Physical Exam Constitutional:      General: He is not in acute distress.    Appearance: He is well-developed. He is obese.     Comments: NAD  Eyes:     Conjunctiva/sclera: Conjunctivae normal.     Pupils: Pupils are equal, round, and reactive to light.  Neck:     Thyroid: No thyromegaly.     Vascular: No JVD.  Cardiovascular:     Rate and Rhythm: Normal rate and regular rhythm.  Heart sounds: Normal heart sounds. No murmur heard.  No friction rub. No gallop.   Pulmonary:     Effort: Pulmonary effort is normal. No respiratory distress.     Breath sounds: Normal breath sounds. No wheezing or rales.  Chest:     Chest wall: No tenderness.  Abdominal:     General: Bowel sounds are normal. There is no distension.     Palpations: Abdomen is soft. There is no mass.     Tenderness: There is no abdominal tenderness. There is no guarding or rebound.  Musculoskeletal:        General: No tenderness. Normal range of motion.     Cervical back: Normal range of motion.   Lymphadenopathy:     Cervical: No cervical adenopathy.  Skin:    General: Skin is warm and dry.     Findings: No rash.  Neurological:     Mental Status: He is alert and oriented to person, place, and time.     Cranial Nerves: No cranial nerve deficit.     Motor: No abnormal muscle tone.     Coordination: Coordination normal.     Gait: Gait normal.     Deep Tendon Reflexes: Reflexes are normal and symmetric.  Psychiatric:        Behavior: Behavior normal.        Thought Content: Thought content normal.        Judgment: Judgment normal.     Lab Results  Component Value Date   WBC 8.2 10/29/2019   HGB 13.3 10/29/2019   HCT 38.7 (L) 10/29/2019   PLT 153.0 10/29/2019   GLUCOSE 109 (H) 10/29/2019   CHOL 223 (H) 10/29/2019   TRIG 232.0 (H) 10/29/2019   HDL 34.40 (L) 10/29/2019   LDLDIRECT 153.0 10/29/2019   LDLCALC Comment 10/18/2017   ALT 41 10/29/2019   AST 35 10/29/2019   NA 136 10/29/2019   K 4.3 10/29/2019   CL 101 10/29/2019   CREATININE 0.88 10/29/2019   BUN 19 10/29/2019   CO2 28 10/29/2019   TSH 3.01 10/29/2019   PSA 0.92 10/29/2019   INR 1.01 03/28/2012   HGBA1C 6.4 10/29/2019    CT CARDIAC SCORING  Addendum Date: 03/25/2019   ADDENDUM REPORT: 03/25/2019 10:43 ADDENDUM: OVER-READ INTERPRETATION  CT CHEST The following report is an over-read performed by radiologist Dr. Forest Gleason [facility] on 03/25/2019. This over-read does not include interpretation of cardiac or coronary anatomy or pathology. The coronary calcium interpretation by the cardiologist is attached. COMPARISON: 11/30/2011 chest CT. FINDINGS: Vascular: Aortic atherosclerosis. Mediastinum/Nodes: No imaged thoracic adenopathy. Tiny hiatal hernia. The distal esophagus is moderately thick walled, including on 56/4. Lungs/Pleura: No imaged pleural fluid. Bibasilar scarring or subsegmental atelectasis. Minimal motion degradation in the lower chest. Posterior right lower lobe and right lower  lobe calcified granulomas. Upper Abdomen: Mild hepatic steatosis. Normal imaged portions of the spleen. Musculoskeletal: Remote right rib fractures. IMPRESSION: 1.  No acute findings in the imaged extracardiac chest. 2.  Aortic Atherosclerosis (ICD10-I70.0). 3. Tiny hiatal hernia. Distal esophageal wall thickening, suggesting esophagitis. 4. Mild hepatic steatosis. Electronically Signed   By: Abigail Miyamoto M.D.   On: 03/25/2019 10:43   Result Date: 03/25/2019 CLINICAL DATA:  Risk stratification EXAM: Coronary Calcium Score TECHNIQUE: The patient was scanned on a [employer]. Axial non-contrast 3 mm slices were carried out through the heart. The data set was analyzed on a dedicated work station and scored using the [place]. FINDINGS: Non-cardiac:  See separate report from [facility]. Ascending Aorta: Normal Caliber. Calcifications of the aortic root and ascending aorta. Pericardium: Normal Coronary arteries: Normal coronary origins with coronary calcifications in the RCA and LAD. IMPRESSION: Coronary calcium score of 61. This was 71st percentile for age and sex matched control. Fransico Him Electronically Signed: By: Fransico Him On: 03/25/2019 09:03    Assessment & Plan:   There are no diagnoses linked to this encounter.   No orders of the defined types were placed in this encounter.    Follow-up: No follow-ups on file.  Walker Kehr, MD

## 2019-12-12 ENCOUNTER — Telehealth: Payer: Self-pay | Admitting: Internal Medicine

## 2019-12-12 NOTE — Telephone Encounter (Signed)
Christian [name] called to find out the name of the testosterone cream/ointment that was recommended from her husband's visit on Tuesday 7.27.21.  Patient was advised to check insurance and wife is asking for specifics so she knows what to ask about.

## 2019-12-13 NOTE — Telephone Encounter (Signed)
Spoke with pt's wife and informed her that the testosterone 200 mg inj is preferred for insurance. And that nothing else has been tired so insurance would not cover the gel.

## 2019-12-17 ENCOUNTER — Telehealth: Payer: Self-pay

## 2019-12-17 NOTE — Telephone Encounter (Signed)
New message   The patient wife call asking for a call back regarding a previous conversation with Dr. Judeen Hammans regarding insurance coverage on Testerone shot or cream for her husband.

## 2019-12-18 NOTE — Telephone Encounter (Signed)
Pt's wife states that pt's insurance will not be able to tell her if testosterone shot or cream is covered unless MD puts in an order for the one he wants pt to use.   Please place an order for pt due to testosterone levels   [id number] - [place], [place] - [address]

## 2019-12-19 NOTE — Telephone Encounter (Signed)
What is his preference -injection or topical? Thanks

## 2019-12-20 MED ORDER — TESTOSTERONE 20.25 MG/ACT (1.62%) TD GEL: 2.0000 | TRANSDERMAL | 5 refills | Status: DC

## 2019-12-20 NOTE — Telephone Encounter (Signed)
Okay AndroGel.  Prescription emailed.  Thanks

## 2019-12-20 NOTE — Telephone Encounter (Signed)
Pt's wife informed of below.  

## 2019-12-26 ENCOUNTER — Other Ambulatory Visit: Payer: Self-pay | Admitting: Internal Medicine

## 2020-01-16 ENCOUNTER — Other Ambulatory Visit: Payer: Self-pay | Admitting: Internal Medicine

## 2020-01-16 DIAGNOSIS — E7849 Other hyperlipidemia: Secondary | ICD-10-CM

## 2020-02-09 ENCOUNTER — Encounter (HOSPITAL_COMMUNITY): Payer: Self-pay

## 2020-02-09 ENCOUNTER — Emergency Department (HOSPITAL_COMMUNITY): Payer: 59

## 2020-02-09 ENCOUNTER — Inpatient Hospital Stay (HOSPITAL_COMMUNITY)
Admission: EM | Admit: 2020-02-09 | Discharge: 2020-02-17 | DRG: 871 | Disposition: A | Discharge status: Home / Self Care | Payer: 59 | Attending: Internal Medicine | Admitting: Internal Medicine

## 2020-02-09 ENCOUNTER — Other Ambulatory Visit: Payer: Self-pay

## 2020-02-09 DIAGNOSIS — Z79899 Other long term (current) drug therapy: Secondary | ICD-10-CM

## 2020-02-09 DIAGNOSIS — F419 Anxiety disorder, unspecified: Secondary | ICD-10-CM

## 2020-02-09 DIAGNOSIS — R778 Other specified abnormalities of plasma proteins: Secondary | ICD-10-CM | POA: Diagnosis present

## 2020-02-09 DIAGNOSIS — Z888 Allergy status to other drugs, medicaments and biological substances status: Secondary | ICD-10-CM

## 2020-02-09 DIAGNOSIS — G4733 Obstructive sleep apnea (adult) (pediatric): Secondary | ICD-10-CM | POA: Diagnosis present

## 2020-02-09 DIAGNOSIS — J690 Pneumonitis due to inhalation of food and vomit: Secondary | ICD-10-CM

## 2020-02-09 DIAGNOSIS — Z79890 Hormone replacement therapy (postmenopausal): Secondary | ICD-10-CM

## 2020-02-09 DIAGNOSIS — J45901 Unspecified asthma with (acute) exacerbation: Secondary | ICD-10-CM | POA: Diagnosis present

## 2020-02-09 DIAGNOSIS — E669 Obesity, unspecified: Secondary | ICD-10-CM | POA: Diagnosis present

## 2020-02-09 DIAGNOSIS — F411 Generalized anxiety disorder: Secondary | ICD-10-CM | POA: Diagnosis present

## 2020-02-09 DIAGNOSIS — Z818 Family history of other mental and behavioral disorders: Secondary | ICD-10-CM

## 2020-02-09 DIAGNOSIS — N529 Male erectile dysfunction, unspecified: Secondary | ICD-10-CM | POA: Diagnosis present

## 2020-02-09 DIAGNOSIS — B349 Viral infection, unspecified: Secondary | ICD-10-CM

## 2020-02-09 DIAGNOSIS — Z8042 Family history of malignant neoplasm of prostate: Secondary | ICD-10-CM

## 2020-02-09 DIAGNOSIS — Z77120 Contact with and (suspected) exposure to mold (toxic): Secondary | ICD-10-CM

## 2020-02-09 DIAGNOSIS — K760 Fatty (change of) liver, not elsewhere classified: Secondary | ICD-10-CM | POA: Diagnosis present

## 2020-02-09 DIAGNOSIS — D649 Anemia, unspecified: Secondary | ICD-10-CM

## 2020-02-09 DIAGNOSIS — Z20822 Contact with and (suspected) exposure to covid-19: Secondary | ICD-10-CM | POA: Diagnosis present

## 2020-02-09 DIAGNOSIS — Z800 Family history of malignant neoplasm of digestive organs: Secondary | ICD-10-CM

## 2020-02-09 DIAGNOSIS — Z8249 Family history of ischemic heart disease and other diseases of the circulatory system: Secondary | ICD-10-CM

## 2020-02-09 DIAGNOSIS — J9601 Acute respiratory failure with hypoxia: Secondary | ICD-10-CM | POA: Diagnosis present

## 2020-02-09 DIAGNOSIS — R079 Chest pain, unspecified: Secondary | ICD-10-CM

## 2020-02-09 DIAGNOSIS — K2100 Gastro-esophageal reflux disease with esophagitis, without bleeding: Secondary | ICD-10-CM | POA: Diagnosis present

## 2020-02-09 DIAGNOSIS — R7989 Other specified abnormal findings of blood chemistry: Secondary | ICD-10-CM

## 2020-02-09 DIAGNOSIS — Z6838 Body mass index (BMI) 38.0-38.9, adult: Secondary | ICD-10-CM

## 2020-02-09 DIAGNOSIS — J309 Allergic rhinitis, unspecified: Secondary | ICD-10-CM | POA: Diagnosis present

## 2020-02-09 DIAGNOSIS — Z83438 Family history of other disorder of lipoprotein metabolism and other lipidemia: Secondary | ICD-10-CM

## 2020-02-09 DIAGNOSIS — R0600 Dyspnea, unspecified: Secondary | ICD-10-CM

## 2020-02-09 DIAGNOSIS — R6510 Systemic inflammatory response syndrome (SIRS) of non-infectious origin without acute organ dysfunction: Secondary | ICD-10-CM | POA: Diagnosis not present

## 2020-02-09 DIAGNOSIS — M109 Gout, unspecified: Secondary | ICD-10-CM | POA: Diagnosis present

## 2020-02-09 DIAGNOSIS — I440 Atrioventricular block, first degree: Secondary | ICD-10-CM | POA: Diagnosis present

## 2020-02-09 DIAGNOSIS — A419 Sepsis, unspecified organism: Secondary | ICD-10-CM | POA: Diagnosis not present

## 2020-02-09 DIAGNOSIS — Z833 Family history of diabetes mellitus: Secondary | ICD-10-CM

## 2020-02-09 DIAGNOSIS — E291 Testicular hypofunction: Secondary | ICD-10-CM | POA: Diagnosis present

## 2020-02-09 DIAGNOSIS — G8929 Other chronic pain: Secondary | ICD-10-CM | POA: Diagnosis present

## 2020-02-09 DIAGNOSIS — J181 Lobar pneumonia, unspecified organism: Secondary | ICD-10-CM | POA: Diagnosis present

## 2020-02-09 DIAGNOSIS — J329 Chronic sinusitis, unspecified: Secondary | ICD-10-CM | POA: Diagnosis present

## 2020-02-09 DIAGNOSIS — I10 Essential (primary) hypertension: Secondary | ICD-10-CM | POA: Diagnosis present

## 2020-02-09 DIAGNOSIS — Z87891 Personal history of nicotine dependence: Secondary | ICD-10-CM

## 2020-02-09 DIAGNOSIS — M545 Low back pain, unspecified: Secondary | ICD-10-CM | POA: Diagnosis present

## 2020-02-09 DIAGNOSIS — F1020 Alcohol dependence, uncomplicated: Secondary | ICD-10-CM | POA: Diagnosis present

## 2020-02-09 DIAGNOSIS — E785 Hyperlipidemia, unspecified: Secondary | ICD-10-CM | POA: Diagnosis present

## 2020-02-09 DIAGNOSIS — R7982 Elevated C-reactive protein (CRP): Secondary | ICD-10-CM | POA: Diagnosis present

## 2020-02-09 DIAGNOSIS — R739 Hyperglycemia, unspecified: Secondary | ICD-10-CM

## 2020-02-09 DIAGNOSIS — E871 Hypo-osmolality and hyponatremia: Secondary | ICD-10-CM

## 2020-02-09 DIAGNOSIS — D3501 Benign neoplasm of right adrenal gland: Secondary | ICD-10-CM | POA: Diagnosis present

## 2020-02-09 DIAGNOSIS — M1990 Unspecified osteoarthritis, unspecified site: Secondary | ICD-10-CM | POA: Diagnosis present

## 2020-02-09 DIAGNOSIS — Z8551 Personal history of malignant neoplasm of bladder: Secondary | ICD-10-CM

## 2020-02-09 LAB — RESPIRATORY PANEL BY RT PCR (FLU A&B, COVID)
Influenza A by PCR: NEGATIVE
Influenza B by PCR: NEGATIVE
SARS Coronavirus 2 by RT PCR: NEGATIVE

## 2020-02-09 LAB — CBC
HCT: 38.8 % — ABNORMAL LOW (ref 39.0–52.0)
Hemoglobin: 12.6 g/dL — ABNORMAL LOW (ref 13.0–17.0)
MCH: 31.5 pg (ref 26.0–34.0)
MCHC: 32.5 g/dL (ref 30.0–36.0)
MCV: 97 fL (ref 80.0–100.0)
Platelets: 189 10*3/uL (ref 150–400)
RBC: 4 MIL/uL — ABNORMAL LOW (ref 4.22–5.81)
RDW: 13.1 % (ref 11.5–15.5)
WBC: 10.6 10*3/uL — ABNORMAL HIGH (ref 4.0–10.5)
nRBC: 0 % (ref 0.0–0.2)

## 2020-02-09 LAB — BASIC METABOLIC PANEL
Anion gap: 15 (ref 5–15)
BUN: 8 mg/dL (ref 6–20)
CO2: 21 mmol/L — ABNORMAL LOW (ref 22–32)
Calcium: 9.5 mg/dL (ref 8.9–10.3)
Chloride: 97 mmol/L — ABNORMAL LOW (ref 98–111)
Creatinine, Ser: 0.95 mg/dL (ref 0.61–1.24)
GFR calc Af Amer: >60 mL/min (ref >60)
GFR calc non Af Amer: >60 mL/min (ref >60)
Glucose, Bld: 228 mg/dL — ABNORMAL HIGH (ref 70–99)
Potassium: 3.7 mmol/L (ref 3.5–5.1)
Sodium: 133 mmol/L — ABNORMAL LOW (ref 135–145)

## 2020-02-09 LAB — TROPONIN I (HIGH SENSITIVITY): Troponin I (High Sensitivity): 17 ng/L (ref <18)

## 2020-02-09 MED ORDER — ACETAMINOPHEN 325 MG PO TABS
650.0000 mg | ORAL_TABLET | Freq: Once | ORAL | Status: AC | PRN
  Administered 2020-02-09: 650 mg via ORAL
  Filled 2020-02-09: qty 2

## 2020-02-09 NOTE — ED Triage Notes (Signed)
Pt states that he has been having central CP for the past 2 days, with SOB, pain comes and goes. Denies n/v, pt has had a fevers for the past 4 days and is unvaccinated

## 2020-02-10 ENCOUNTER — Inpatient Hospital Stay (HOSPITAL_COMMUNITY): Payer: 59

## 2020-02-10 ENCOUNTER — Other Ambulatory Visit: Payer: Self-pay

## 2020-02-10 ENCOUNTER — Observation Stay (HOSPITAL_COMMUNITY): Payer: 59

## 2020-02-10 DIAGNOSIS — R0600 Dyspnea, unspecified: Secondary | ICD-10-CM | POA: Diagnosis not present

## 2020-02-10 DIAGNOSIS — G8929 Other chronic pain: Secondary | ICD-10-CM | POA: Diagnosis present

## 2020-02-10 DIAGNOSIS — J690 Pneumonitis due to inhalation of food and vomit: Secondary | ICD-10-CM | POA: Diagnosis present

## 2020-02-10 DIAGNOSIS — M545 Low back pain, unspecified: Secondary | ICD-10-CM | POA: Diagnosis present

## 2020-02-10 DIAGNOSIS — Z83438 Family history of other disorder of lipoprotein metabolism and other lipidemia: Secondary | ICD-10-CM | POA: Diagnosis not present

## 2020-02-10 DIAGNOSIS — B349 Viral infection, unspecified: Secondary | ICD-10-CM | POA: Diagnosis present

## 2020-02-10 DIAGNOSIS — I10 Essential (primary) hypertension: Secondary | ICD-10-CM | POA: Diagnosis present

## 2020-02-10 DIAGNOSIS — A419 Sepsis, unspecified organism: Secondary | ICD-10-CM | POA: Diagnosis present

## 2020-02-10 DIAGNOSIS — J9601 Acute respiratory failure with hypoxia: Secondary | ICD-10-CM | POA: Diagnosis present

## 2020-02-10 DIAGNOSIS — K2100 Gastro-esophageal reflux disease with esophagitis, without bleeding: Secondary | ICD-10-CM | POA: Diagnosis present

## 2020-02-10 DIAGNOSIS — Z8249 Family history of ischemic heart disease and other diseases of the circulatory system: Secondary | ICD-10-CM | POA: Diagnosis not present

## 2020-02-10 DIAGNOSIS — E291 Testicular hypofunction: Secondary | ICD-10-CM | POA: Diagnosis present

## 2020-02-10 DIAGNOSIS — R6510 Systemic inflammatory response syndrome (SIRS) of non-infectious origin without acute organ dysfunction: Secondary | ICD-10-CM | POA: Diagnosis present

## 2020-02-10 DIAGNOSIS — J45901 Unspecified asthma with (acute) exacerbation: Secondary | ICD-10-CM | POA: Diagnosis present

## 2020-02-10 DIAGNOSIS — Z6838 Body mass index (BMI) 38.0-38.9, adult: Secondary | ICD-10-CM | POA: Diagnosis not present

## 2020-02-10 DIAGNOSIS — E785 Hyperlipidemia, unspecified: Secondary | ICD-10-CM | POA: Diagnosis present

## 2020-02-10 DIAGNOSIS — Z20822 Contact with and (suspected) exposure to covid-19: Secondary | ICD-10-CM | POA: Diagnosis present

## 2020-02-10 DIAGNOSIS — Z818 Family history of other mental and behavioral disorders: Secondary | ICD-10-CM | POA: Diagnosis not present

## 2020-02-10 DIAGNOSIS — M1990 Unspecified osteoarthritis, unspecified site: Secondary | ICD-10-CM | POA: Diagnosis present

## 2020-02-10 DIAGNOSIS — F411 Generalized anxiety disorder: Secondary | ICD-10-CM | POA: Diagnosis present

## 2020-02-10 DIAGNOSIS — Z8042 Family history of malignant neoplasm of prostate: Secondary | ICD-10-CM | POA: Diagnosis not present

## 2020-02-10 DIAGNOSIS — E871 Hypo-osmolality and hyponatremia: Secondary | ICD-10-CM | POA: Diagnosis present

## 2020-02-10 DIAGNOSIS — M109 Gout, unspecified: Secondary | ICD-10-CM | POA: Diagnosis present

## 2020-02-10 DIAGNOSIS — R079 Chest pain, unspecified: Secondary | ICD-10-CM | POA: Diagnosis not present

## 2020-02-10 DIAGNOSIS — Z888 Allergy status to other drugs, medicaments and biological substances status: Secondary | ICD-10-CM | POA: Diagnosis not present

## 2020-02-10 DIAGNOSIS — N529 Male erectile dysfunction, unspecified: Secondary | ICD-10-CM | POA: Diagnosis present

## 2020-02-10 DIAGNOSIS — Z8551 Personal history of malignant neoplasm of bladder: Secondary | ICD-10-CM | POA: Diagnosis not present

## 2020-02-10 DIAGNOSIS — J181 Lobar pneumonia, unspecified organism: Secondary | ICD-10-CM | POA: Diagnosis present

## 2020-02-10 LAB — RAPID URINE DRUG SCREEN, HOSP PERFORMED
Amphetamines: NOT DETECTED
Barbiturates: NOT DETECTED
Benzodiazepines: NOT DETECTED
Cocaine: NOT DETECTED
Opiates: POSITIVE — AB
Tetrahydrocannabinol: NOT DETECTED

## 2020-02-10 LAB — URINALYSIS, ROUTINE W REFLEX MICROSCOPIC
Bacteria, UA: NONE SEEN
Bilirubin Urine: NEGATIVE
Glucose, UA: NEGATIVE mg/dL
Hgb urine dipstick: NEGATIVE
Ketones, ur: NEGATIVE mg/dL
Leukocytes,Ua: NEGATIVE
Nitrite: NEGATIVE
Protein, ur: 100 mg/dL — AB
Specific Gravity, Urine: 1.026 (ref 1.005–1.030)
pH: 5 (ref 5.0–8.0)

## 2020-02-10 LAB — HEMOGLOBIN A1C
Hgb A1c MFr Bld: 5.4 % (ref 4.8–5.6)
Mean Plasma Glucose: 108.28 mg/dL

## 2020-02-10 LAB — HEPATIC FUNCTION PANEL
ALT: 46 U/L — ABNORMAL HIGH (ref 0–44)
AST: 32 U/L (ref 15–41)
Albumin: 3.8 g/dL (ref 3.5–5.0)
Alkaline Phosphatase: 62 U/L (ref 38–126)
Bilirubin, Direct: 0.3 mg/dL — ABNORMAL HIGH (ref 0.0–0.2)
Indirect Bilirubin: 0.8 mg/dL (ref 0.3–0.9)
Total Bilirubin: 1.1 mg/dL (ref 0.3–1.2)
Total Protein: 8.3 g/dL — ABNORMAL HIGH (ref 6.5–8.1)

## 2020-02-10 LAB — LIPID PANEL
Cholesterol: 150 mg/dL (ref 0–200)
HDL: 27 mg/dL — ABNORMAL LOW (ref >40)
LDL Cholesterol: 96 mg/dL (ref 0–99)
Total CHOL/HDL Ratio: 5.6 RATIO
Triglycerides: 137 mg/dL (ref <150)
VLDL: 27 mg/dL (ref 0–40)

## 2020-02-10 LAB — BRAIN NATRIURETIC PEPTIDE: B Natriuretic Peptide: 67.4 pg/mL (ref 0.0–100.0)

## 2020-02-10 LAB — TROPONIN I (HIGH SENSITIVITY)
Troponin I (High Sensitivity): 26 ng/L — ABNORMAL HIGH (ref <18)
Troponin I (High Sensitivity): 22 ng/L — ABNORMAL HIGH (ref <18)
Troponin I (High Sensitivity): 30 ng/L — ABNORMAL HIGH (ref <18)

## 2020-02-10 LAB — FIBRINOGEN: Fibrinogen: >800 mg/dL — ABNORMAL HIGH (ref 210–475)

## 2020-02-10 LAB — C-REACTIVE PROTEIN: CRP: 25.2 mg/dL — ABNORMAL HIGH (ref <1.0)

## 2020-02-10 LAB — RESPIRATORY PANEL BY RT PCR (FLU A&B, COVID)
Influenza A by PCR: NEGATIVE
Influenza B by PCR: NEGATIVE
SARS Coronavirus 2 by RT PCR: NEGATIVE

## 2020-02-10 LAB — LIPASE, BLOOD: Lipase: 24 U/L (ref 11–51)

## 2020-02-10 LAB — CBG MONITORING, ED
Glucose-Capillary: 124 mg/dL — ABNORMAL HIGH (ref 70–99)
Glucose-Capillary: 121 mg/dL — ABNORMAL HIGH (ref 70–99)
Glucose-Capillary: 136 mg/dL — ABNORMAL HIGH (ref 70–99)
Glucose-Capillary: 135 mg/dL — ABNORMAL HIGH (ref 70–99)

## 2020-02-10 LAB — ETHANOL: Alcohol, Ethyl (B): <10 mg/dL (ref <10)

## 2020-02-10 LAB — CK: Total CK: 57 U/L (ref 49–397)

## 2020-02-10 LAB — TSH: TSH: 1.424 u[IU]/mL (ref 0.350–4.500)

## 2020-02-10 LAB — MAGNESIUM: Magnesium: 2 mg/dL (ref 1.7–2.4)

## 2020-02-10 LAB — LACTIC ACID, PLASMA: Lactic Acid, Venous: 1.4 mmol/L (ref 0.5–1.9)

## 2020-02-10 LAB — D-DIMER, QUANTITATIVE: D-Dimer, Quant: 1.05 ug/mL-FEU — ABNORMAL HIGH (ref 0.00–0.50)

## 2020-02-10 LAB — PROCALCITONIN: Procalcitonin: 2.7 ng/mL

## 2020-02-10 LAB — FERRITIN: Ferritin: 681 ng/mL — ABNORMAL HIGH (ref 24–336)

## 2020-02-10 MED ORDER — MORPHINE SULFATE (PF) 2 MG/ML IV SOLN
2.0000 mg | INTRAVENOUS | Status: DC | PRN
  Administered 2020-02-10 – 2020-02-11 (×2): 2 mg via INTRAVENOUS
  Filled 2020-02-10 (×2): qty 1

## 2020-02-10 MED ORDER — IOHEXOL 300 MG/ML  SOLN
100.0000 mL | Freq: Once | INTRAMUSCULAR | Status: AC | PRN
  Administered 2020-02-10: 100 mL via INTRAVENOUS

## 2020-02-10 MED ORDER — KETOROLAC TROMETHAMINE 30 MG/ML IJ SOLN
30.0000 mg | Freq: Four times a day (QID) | INTRAMUSCULAR | Status: DC | PRN
  Administered 2020-02-11 – 2020-02-13 (×3): 30 mg via INTRAVENOUS
  Filled 2020-02-10 (×3): qty 1

## 2020-02-10 MED ORDER — HYDROCODONE-ACETAMINOPHEN 5-325 MG PO TABS
0.5000 | ORAL_TABLET | Freq: Four times a day (QID) | ORAL | Status: DC | PRN
  Administered 2020-02-10 – 2020-02-11 (×4): 1 via ORAL
  Administered 2020-02-11: 0.5 via ORAL
  Administered 2020-02-12 – 2020-02-16 (×13): 1 via ORAL
  Filled 2020-02-10 (×20): qty 1

## 2020-02-10 MED ORDER — KETOROLAC TROMETHAMINE 30 MG/ML IJ SOLN
30.0000 mg | Freq: Once | INTRAMUSCULAR | Status: AC
  Administered 2020-02-10: 30 mg via INTRAVENOUS
  Filled 2020-02-10: qty 1

## 2020-02-10 MED ORDER — ACETAMINOPHEN 325 MG PO TABS
650.0000 mg | ORAL_TABLET | Freq: Once | ORAL | Status: AC
  Administered 2020-02-10: 650 mg via ORAL
  Filled 2020-02-10: qty 2

## 2020-02-10 MED ORDER — ACETAMINOPHEN 650 MG RE SUPP: 650.0000 mg | Freq: Four times a day (QID) | RECTAL | Status: DC | PRN

## 2020-02-10 MED ORDER — LACTATED RINGERS IV BOLUS
1000.0000 mL | Freq: Once | INTRAVENOUS | Status: AC
  Administered 2020-02-10: 1000 mL via INTRAVENOUS

## 2020-02-10 MED ORDER — ASPIRIN EC 81 MG PO TBEC
81.0000 mg | DELAYED_RELEASE_TABLET | Freq: Every day | ORAL | Status: DC
  Administered 2020-02-10 – 2020-02-17 (×8): 81 mg via ORAL
  Filled 2020-02-10 (×8): qty 1

## 2020-02-10 MED ORDER — SODIUM CHLORIDE 0.9 % IV SOLN
2.0000 g | Freq: Three times a day (TID) | INTRAVENOUS | Status: DC
  Administered 2020-02-10 – 2020-02-14 (×13): 2 g via INTRAVENOUS
  Filled 2020-02-10 (×15): qty 2

## 2020-02-10 MED ORDER — CLONAZEPAM 1 MG PO TABS
1.0000 mg | ORAL_TABLET | Freq: Two times a day (BID) | ORAL | Status: DC | PRN
  Administered 2020-02-10 – 2020-02-16 (×8): 1 mg via ORAL
  Filled 2020-02-10 (×5): qty 1
  Filled 2020-02-10: qty 2
  Filled 2020-02-10 (×2): qty 1
  Filled 2020-02-10: qty 2
  Filled 2020-02-10: qty 1

## 2020-02-10 MED ORDER — SODIUM CHLORIDE 0.9 % IV SOLN: INTRAVENOUS | Status: DC

## 2020-02-10 MED ORDER — MORPHINE SULFATE (PF) 4 MG/ML IV SOLN
4.0000 mg | Freq: Once | INTRAVENOUS | Status: AC
  Administered 2020-02-10: 4 mg via INTRAVENOUS
  Filled 2020-02-10: qty 1

## 2020-02-10 MED ORDER — ATORVASTATIN CALCIUM 10 MG PO TABS
20.0000 mg | ORAL_TABLET | Freq: Every day | ORAL | Status: DC
  Administered 2020-02-10 – 2020-02-17 (×8): 20 mg via ORAL
  Filled 2020-02-10 (×8): qty 2

## 2020-02-10 MED ORDER — INSULIN ASPART 100 UNIT/ML ~~LOC~~ SOLN
0.0000 [IU] | Freq: Three times a day (TID) | SUBCUTANEOUS | Status: DC
  Administered 2020-02-10 – 2020-02-12 (×4): 3 [IU] via SUBCUTANEOUS
  Administered 2020-02-12 (×2): 4 [IU] via SUBCUTANEOUS
  Administered 2020-02-13 – 2020-02-15 (×5): 3 [IU] via SUBCUTANEOUS
  Administered 2020-02-15 (×2): 4 [IU] via SUBCUTANEOUS
  Administered 2020-02-16: 3 [IU] via SUBCUTANEOUS
  Administered 2020-02-17: 4 [IU] via SUBCUTANEOUS
  Administered 2020-02-17: 3 [IU] via SUBCUTANEOUS

## 2020-02-10 MED ORDER — IRBESARTAN 300 MG PO TABS
300.0000 mg | ORAL_TABLET | Freq: Every day | ORAL | Status: DC
  Administered 2020-02-10 – 2020-02-17 (×8): 300 mg via ORAL
  Filled 2020-02-10 (×8): qty 1

## 2020-02-10 MED ORDER — VANCOMYCIN HCL IN DEXTROSE 1-5 GM/200ML-% IV SOLN
1000.0000 mg | Freq: Two times a day (BID) | INTRAVENOUS | Status: DC
  Administered 2020-02-11 – 2020-02-13 (×6): 1000 mg via INTRAVENOUS
  Filled 2020-02-10 (×6): qty 200

## 2020-02-10 MED ORDER — FENOFIBRATE 54 MG PO TABS
54.0000 mg | ORAL_TABLET | Freq: Every day | ORAL | Status: DC
  Administered 2020-02-10 – 2020-02-17 (×8): 54 mg via ORAL
  Filled 2020-02-10 (×8): qty 1

## 2020-02-10 MED ORDER — ENOXAPARIN SODIUM 60 MG/0.6ML ~~LOC~~ SOLN
60.0000 mg | SUBCUTANEOUS | Status: DC
  Administered 2020-02-10 – 2020-02-11 (×2): 60 mg via SUBCUTANEOUS
  Filled 2020-02-10 (×2): qty 0.6

## 2020-02-10 MED ORDER — ACETAMINOPHEN 325 MG PO TABS
650.0000 mg | ORAL_TABLET | Freq: Four times a day (QID) | ORAL | Status: DC | PRN
  Administered 2020-02-10 – 2020-02-12 (×6): 650 mg via ORAL
  Filled 2020-02-10 (×6): qty 2

## 2020-02-10 MED ORDER — NITROGLYCERIN 0.4 MG SL SUBL: 0.4000 mg | SUBLINGUAL_TABLET | SUBLINGUAL | Status: DC | PRN

## 2020-02-10 MED ORDER — VANCOMYCIN HCL 2000 MG/400ML IV SOLN
2000.0000 mg | Freq: Once | INTRAVENOUS | Status: AC
  Administered 2020-02-10: 2000 mg via INTRAVENOUS
  Filled 2020-02-10: qty 400

## 2020-02-10 MED ORDER — AMLODIPINE BESYLATE 5 MG PO TABS
5.0000 mg | ORAL_TABLET | Freq: Every day | ORAL | Status: DC
  Administered 2020-02-10 – 2020-02-17 (×8): 5 mg via ORAL
  Filled 2020-02-10 (×8): qty 1
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
## 2020-02-10 NOTE — ED Notes (Signed)
Please call pt's wife for updates.

## 2020-02-10 NOTE — ED Notes (Signed)
Has been feeling bad for about 3 days-- fever unrelieved with tylenol, UNVACCINATED -- denies any known exposures.

## 2020-02-10 NOTE — Progress Notes (Signed)
  Echocardiogram 2D Echocardiogram has been attempted 2x. Patient left for CT.  Randa Lynn Bryer Gottsch 02/10/2020, 3:39 PM

## 2020-02-10 NOTE — Progress Notes (Signed)
  Echocardiogram 2D Echocardiogram has been attempted. Will reattempt at later time.  Deepak Bless G Lasandra Batley 02/10/2020, 11:01 AM
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
## 2020-02-10 NOTE — Progress Notes (Signed)
Pharmacy Antibiotic Note  Christian [name] is a 59 y.o. male admitted on 02/09/2020 with sepsis.  Pharmacy has been consulted for vancomycin and cefepime dosing. Pt is febrile with Tmax 103.1 and WBC is 10.6. SCr is WNL and lactic acid is <2.   Plan: Vancomycin 2gm IV x 1 then 1gm IV Q12H Cefepime 2gm IV Q8H F/u renal fxn, C&S, clinical status and trough at SS  Height: 5\' 8"  (172.7 cm) Weight: 115.7 kg (255 lb) IBW/kg (Calculated) : 68.4  Temp (24hrs), Avg:101.5 F (38.6 C), Min:99 F (37.2 C), Max:103.1 F (39.5 C)  Recent Labs  Lab 02/09/20 1954 02/10/20 0330  WBC 10.6*  --   CREATININE 0.95  --   LATICACIDVEN  --  1.4    Estimated Creatinine Clearance: 105.9 mL/min (by C-G formula based on SCr of 0.95 mg/dL).    Allergies  Allergen Reactions  . Lexapro [Escitalopram Oxalate]     tired  . Lipitor [Atorvastatin]     myalgias  . Lisinopril     Cough     Antimicrobials this admission: Vanc 9/27>> Cefepime 9/27>>  Dose adjustments this admission: N/A  Microbiology results: Pending  Thank you for allowing pharmacy to be a part of this patient's care.  Margie Brink, Rande Lawman 02/10/2020 12:09 PM

## 2020-02-11 ENCOUNTER — Inpatient Hospital Stay (HOSPITAL_COMMUNITY): Payer: 59

## 2020-02-11 DIAGNOSIS — B349 Viral infection, unspecified: Secondary | ICD-10-CM | POA: Diagnosis not present

## 2020-02-11 DIAGNOSIS — R079 Chest pain, unspecified: Secondary | ICD-10-CM

## 2020-02-11 DIAGNOSIS — R0600 Dyspnea, unspecified: Secondary | ICD-10-CM

## 2020-02-11 LAB — CBG MONITORING, ED
Glucose-Capillary: 114 mg/dL — ABNORMAL HIGH (ref 70–99)
Glucose-Capillary: 124 mg/dL — ABNORMAL HIGH (ref 70–99)

## 2020-02-11 LAB — ECHOCARDIOGRAM COMPLETE
Area-P 1/2: 2.73 cm2
Height: 68 in
S' Lateral: 3.7 cm
Weight: 4080 oz

## 2020-02-11 LAB — HIV ANTIBODY (ROUTINE TESTING W REFLEX): HIV Screen 4th Generation wRfx: NONREACTIVE

## 2020-02-11 LAB — PROCALCITONIN: Procalcitonin: 2.19 ng/mL

## 2020-02-11 LAB — GLUCOSE, CAPILLARY
Glucose-Capillary: 109 mg/dL — ABNORMAL HIGH (ref 70–99)
Glucose-Capillary: 158 mg/dL — ABNORMAL HIGH (ref 70–99)

## 2020-02-11 MED ORDER — IPRATROPIUM-ALBUTEROL 0.5-2.5 (3) MG/3ML IN SOLN
3.0000 mL | RESPIRATORY_TRACT | Status: DC | PRN
  Administered 2020-02-11 – 2020-02-13 (×4): 3 mL via RESPIRATORY_TRACT
  Filled 2020-02-11 (×4): qty 3

## 2020-02-11 MED ORDER — PERFLUTREN LIPID MICROSPHERE
1.0000 mL | INTRAVENOUS | Status: AC | PRN
  Administered 2020-02-11: 3 mL via INTRAVENOUS
  Filled 2020-02-11: qty 10

## 2020-02-11 NOTE — ED Notes (Signed)
Wife Called for an update [phone] .

## 2020-02-11 NOTE — Progress Notes (Addendum)
PROGRESS NOTE    Christian [name]  VHQ:[id number] DOB: [birth year] DOA: 02/09/2020 PCP: Cassandria Anger, MD   Brief Narrative:  Christian [name] is a 59 y.o. male with medical history significant of hypertension, hyperlipidemia, low-grade bladder cancer status post TURBT in 2018, chronic pain, gout, morbid obesity, alcohol abuse presents to emergency department with fever, body ache, not feeling well overall and midsternal chest pain since 4 days. Patient reports high-grade fever associated with chills, sweating, generalized weakness, body ache, headache, have no energy and midsternal chest pain.  Reports chest pain is mild, 3 out of 10, nonradiating, sore-like, denies association with shortness of breath, palpitation, leg swelling, nausea, vomiting. Reports intermittent dry cough however denies wheezing, orthopnea, PND, weight gain, history of CHF, urinary symptoms, diarrhea. He is not vaccinated against COVID-19.  Denies known COVID-19 exposure.  Denies smoking, illicit drug use however drinks 3-4 beers every night.  He lives with his wife at home.  He was referred to sleep study to rule out obstructive sleep apnea by his PCP however have not scheduled appointment yet. Upon arrival to ED: Patient had fever of 103.1, tachycardia, tachypnea, maintaining oxygen saturation on room air, COVID-19 negative, CBC shows leukocytosis of 10.6, BMP shows sodium of 133, initial troponin XVII trended up to 26, lactic acid: WNL, UA negative for infection.  A1c, blood culture: Pending.  Chest x-ray shows mild central vascular congestion with some hazy opacity traverse the lung bases.  Suspicious for mild interstitial edema though atypical infection could present similarly given the setting of fever.  Atelectatic changes likely present as well.  Cardiac silhouette appears enlarged from prior portable x-ray.  Echo was ordered and is pending.  Patient was given IV fluid bolus, morphine, Toradol and Tylenol in ED.  [facility] consulted for admission for likely post viral syndrome.   Assessment & Plan:   Principal Problem:   Viral syndrome Active Problems:   HTN (hypertension)   Anxiety   Dyslipidemia   Obesity   Low back pain   Gout   SIRS (systemic inflammatory response syndrome) (HCC)   Sepsis secondary to bilobar pneumonia, concern for aspiration of the right upper lobe and right lower lobe, POA - Sepsis given source, Fever of 103.1, tachycardia, tachypnea.  COVID-19 negative.   - Covid and influenza PCR negative -Continue IV fluids, support for fever including Tylenol, cooling blanket, ice packs -Chest x-ray shows minimal haziness at the left cardiac border, CT shows remarkable polylobar pneumonia on the right side. -Procalcitonin positive at 2.7, cultures pending  Pleuritic chest pain, resolving -Troponin essentially flat.  EKG no acute changes. -Echo is ordered and is pending  Essential hypertension, stable -Continue home meds-amlodipine, Avapro -Continue to monitor blood pressure closely  Hyperlipidemia: -Continue statin, fenofibrate  Gout: -Hold colchicine and indomethacin  Low back pain/chronic pain:  Continue Norco as needed  Generalized anxiety disorder: Continue Klonopin as needed  Morbid obesity with BMI of 38 -Diet modification/exercise/weight loss recommended  Alcohol abuse: -Drinks 3-4 beers every night, ethanol negative, follow closely for signs and symptoms of withdrawal -Counseled about cessation.   history of low-grade bladder cancer status post TURBT in 2018.  Chronic fatigue: On testosterone gel-hold for now.  DVT prophylaxis: Lovenox/SCD Code Status:  FULL Family Communication: None present at bedside, called wife without answer - left message to try back later.  Status is: Inpatient  Dispo: The patient is from: Home              Anticipated  d/c is to: Home              Anticipated d/c date is: 72 to 96 hours pending clinical  course              Patient currently not medically stable for discharge given ongoing sepsis protocol criteria with pneumonia, high-grade fevers and aspiratory distress in the setting of pleuritic chest pain  Consultants:   None  Procedures:   None  Antimicrobials:  Cefepime  Subjective: No acute issues or events overnight, patient complaining of ongoing fevers and chills as well as pleuritic chest pain with deep inspiration but otherwise denies nausea, vomiting, diarrhea, constipation, headache, shortness of breath.  Objective: Vitals:   02/10/20 2234 02/11/20 0335 02/11/20 0733 02/11/20 0734  BP: (!) 178/91 (!) 162/91 140/78   Pulse: (!) 102 98 88   Resp: (!) 26 20 (!) 23   Temp:  (!) 103.1 F (39.5 C)  100.2 F (37.9 C)  TempSrc:  Oral  Oral  SpO2: 95% 97% 95%   Weight:      Height:        Intake/Output Summary (Last 24 hours) at 02/11/2020 0807 Last data filed at 02/11/2020 0631 Gross per 24 hour  Intake 2082.56 ml  Output --  Net 2082.56 ml   Filed Weights   02/10/20 0329  Weight: 115.7 kg    Examination:  General:  Pleasantly resting in bed, No acute distress.  Somewhat diaphoretic HEENT:  Normocephalic atraumatic.  Sclerae nonicteric, noninjected.  Extraocular movements intact bilaterally. Neck:  Without mass or deformity.  Trachea is midline. Lungs:  Clear to auscultate bilaterally without rhonchi, wheeze, or rales. Heart:  Regular rate and rhythm.  Without murmurs, rubs, or gallops. Abdomen:  Soft, nontender, nondistended.  Without guarding or rebound. Extremities: Without cyanosis, clubbing, edema, or obvious deformity. Vascular:  Dorsalis pedis and posterior tibial pulses palpable bilaterally. Skin:  Warm and dry, no erythema, no ulcerations.  Data Reviewed: I have personally reviewed following labs and imaging studies  CBC: Recent Labs  Lab 02/09/20 1954  WBC 10.6*  HGB 12.6*  HCT 38.8*  MCV 97.0  PLT 272   Basic Metabolic Panel: Recent  Labs  Lab 02/09/20 1954 02/10/20 0840  NA 133*  --   K 3.7  --   CL 97*  --   CO2 21*  --   GLUCOSE 228*  --   BUN 8  --   CREATININE 0.95  --   CALCIUM 9.5  --   MG  --  2.0   GFR: Estimated Creatinine Clearance: 105.9 mL/min (by C-G formula based on SCr of 0.95 mg/dL). Liver Function Tests: Recent Labs  Lab 02/10/20 0311  AST 32  ALT 46*  ALKPHOS 62  BILITOT 1.1  PROT 8.3*  ALBUMIN 3.8   Recent Labs  Lab 02/10/20 1200  LIPASE 24   No results for input(s): AMMONIA in the last 168 hours. Coagulation Profile: No results for input(s): INR, PROTIME in the last 168 hours. Cardiac Enzymes: Recent Labs  Lab 02/10/20 0840  CKTOTAL 57   BNP (last 3 results) No results for input(s): PROBNP in the last 8760 hours. HbA1C: Recent Labs    02/10/20 0840  HGBA1C 5.4   CBG: Recent Labs  Lab 02/10/20 0931 02/10/20 1329 02/10/20 1746 02/10/20 2236 02/11/20 0732  GLUCAP 124* 121* 136* 135* 114*   Lipid Profile: Recent Labs    02/10/20 0840  CHOL 150  HDL 27*  LDLCALC 96  TRIG 137  CHOLHDL 5.6   Thyroid Function Tests: Recent Labs    02/10/20 0840  TSH 1.424   Anemia Panel: Recent Labs    02/10/20 0840  FERRITIN 681*   Sepsis Labs: Recent Labs  Lab 02/10/20 0330 02/10/20 0840 02/11/20 0440  PROCALCITON  --  2.70 2.19  LATICACIDVEN 1.4  --   --     Recent Results (from the past 240 hour(s))  Respiratory Panel by RT PCR (Flu A&B, Covid) - Nasopharyngeal Swab     Status: None   Collection Time: 02/09/20  7:37 PM   Specimen: Nasopharyngeal Swab  Result Value Ref Range Status   SARS Coronavirus 2 by RT PCR NEGATIVE NEGATIVE Final    Comment: (NOTE) SARS-CoV-2 target nucleic acids are NOT DETECTED.  The SARS-CoV-2 RNA is generally detectable in upper respiratoy specimens during the acute phase of infection. The lowest concentration of SARS-CoV-2 viral copies this assay can detect is 131 copies/mL. A negative result does not preclude  SARS-Cov-2 infection and should not be used as the sole basis for treatment or other patient management decisions. A negative result may occur with  improper specimen collection/handling, submission of specimen other than nasopharyngeal swab, presence of viral mutation(s) within the areas targeted by this assay, and inadequate number of viral copies (<131 copies/mL). A negative result must be combined with clinical observations, patient history, and epidemiological information. The expected result is Negative.  Fact Sheet for Patients:  PinkCheek.be  Fact Sheet for Healthcare Providers:  GravelBags.it  This test is no t yet approved or cleared by the [place] FDA and  has been authorized for detection and/or diagnosis of SARS-CoV-2 by FDA under an Emergency Use Authorization (EUA). This EUA will remain  in effect (meaning this test can be used) for the duration of the COVID-19 declaration under Section 564(b)(1) of the Act, 21 U.S.C. section 360bbb-3(b)(1), unless the authorization is terminated or revoked sooner.     Influenza A by PCR NEGATIVE NEGATIVE Final   Influenza B by PCR NEGATIVE NEGATIVE Final    Comment: (NOTE) The Xpert Xpress SARS-CoV-2/FLU/RSV assay is intended as an aid in  the diagnosis of influenza from Nasopharyngeal swab specimens and  should not be used as a sole basis for treatment. Nasal washings and  aspirates are unacceptable for Xpert Xpress SARS-CoV-2/FLU/RSV  testing.  Fact Sheet for Patients: PinkCheek.be  Fact Sheet for Healthcare Providers: GravelBags.it  This test is not yet approved or cleared by the [place] FDA and  has been authorized for detection and/or diagnosis of SARS-CoV-2 by  FDA under an Emergency Use Authorization (EUA). This EUA will remain  in effect (meaning this test can be used) for the duration of the   Covid-19 declaration under Section 564(b)(1) of the Act, 21  U.S.C. section 360bbb-3(b)(1), unless the authorization is  terminated or revoked. Performed at [facility], [place] [address] [place], [place] [address]   Culture, blood (routine x 2)     Status: None (Preliminary result)   Collection Time: 02/10/20  3:26 AM   Specimen: BLOOD LEFT HAND  Result Value Ref Range Status   Specimen Description BLOOD LEFT HAND  Final   Special Requests   Final    BOTTLES DRAWN AEROBIC AND ANAEROBIC Blood Culture results may not be optimal due to an inadequate volume of blood received in culture bottles   Culture   Final    NO GROWTH 1 DAY Performed at [facility], [address], [place], [place] [address]    Report Status PENDING  Incomplete  Culture, blood (routine x 2)     Status: None (Preliminary result)   Collection Time: 02/10/20  3:30 AM   Specimen: BLOOD  Result Value Ref Range Status   Specimen Description BLOOD RIGHT ANTECUBITAL  Final   Special Requests   Final    BOTTLES DRAWN AEROBIC AND ANAEROBIC Blood Culture adequate volume   Culture   Final    NO GROWTH 1 DAY Performed at [facility], [place] [address] [place], [place] [address]    Report Status PENDING  Incomplete  Respiratory Panel by RT PCR (Flu A&B, Covid) - Nasopharyngeal Swab     Status: None   Collection Time: 02/10/20 11:25 AM   Specimen: Nasopharyngeal Swab  Result Value Ref Range Status   SARS Coronavirus 2 by RT PCR NEGATIVE NEGATIVE Final    Comment: (NOTE) SARS-CoV-2 target nucleic acids are NOT DETECTED.  The SARS-CoV-2 RNA is generally detectable in upper respiratoy specimens during the acute phase of infection. The lowest concentration of SARS-CoV-2 viral copies this assay can detect is 131 copies/mL. A negative result does not preclude SARS-Cov-2 infection and should not be used as the sole basis for treatment or other patient management decisions. A negative result may occur with   improper specimen collection/handling, submission of specimen other than nasopharyngeal swab, presence of viral mutation(s) within the areas targeted by this assay, and inadequate number of viral copies (<131 copies/mL). A negative result must be combined with clinical observations, patient history, and epidemiological information. The expected result is Negative.  Fact Sheet for Patients:  PinkCheek.be  Fact Sheet for Healthcare Providers:  GravelBags.it  This test is no t yet approved or cleared by the [place] FDA and  has been authorized for detection and/or diagnosis of SARS-CoV-2 by FDA under an Emergency Use Authorization (EUA). This EUA will remain  in effect (meaning this test can be used) for the duration of the COVID-19 declaration under Section 564(b)(1) of the Act, 21 U.S.C. section 360bbb-3(b)(1), unless the authorization is terminated or revoked sooner.     Influenza A by PCR NEGATIVE NEGATIVE Final   Influenza B by PCR NEGATIVE NEGATIVE Final    Comment: (NOTE) The Xpert Xpress SARS-CoV-2/FLU/RSV assay is intended as an aid in  the diagnosis of influenza from Nasopharyngeal swab specimens and  should not be used as a sole basis for treatment. Nasal washings and  aspirates are unacceptable for Xpert Xpress SARS-CoV-2/FLU/RSV  testing.  Fact Sheet for Patients: PinkCheek.be  Fact Sheet for Healthcare Providers: GravelBags.it  This test is not yet approved or cleared by the [place] FDA and  has been authorized for detection and/or diagnosis of SARS-CoV-2 by  FDA under an Emergency Use Authorization (EUA). This EUA will remain  in effect (meaning this test can be used) for the duration of the  Covid-19 declaration under Section 564(b)(1) of the Act, 21  U.S.C. section 360bbb-3(b)(1), unless the authorization is  terminated or  revoked. Performed at [facility], [place] [address] [place], [place] [address]          Radiology Studies: CT CHEST ABDOMEN PELVIS W CONTRAST  Result Date: 02/10/2020 CLINICAL DATA:  Cough, fever and chills 4 days. Generalized body aches. EXAM: CT CHEST, ABDOMEN, AND PELVIS WITH CONTRAST TECHNIQUE: Multidetector CT imaging of the chest, abdomen and pelvis was performed following the standard protocol during bolus administration of intravenous contrast. CONTRAST:  120mL OMNIPAQUE IOHEXOL 300 MG/ML  SOLN COMPARISON:  Chest x-ray 02/09/2020 FINDINGS: CT CHEST FINDINGS Cardiovascular: The heart is normal in size. No pericardial effusion. The aorta is normal in caliber. Scattered atherosclerotic calcifications. No dissection. The branch vessels are patent. Age advanced three-vessel coronary artery calcifications are noted. Mediastinum/Nodes: Borderline mediastinal and right hilar lymph nodes likely reactive/inflammatory due to the right lung process. Mild distal esophageal wall thickening likely esophagitis. Lungs/Pleura: Extensive airspace consolidation/pneumonia involving the posterior aspect of the right upper lobe and the medial aspect of the entire right lower lobe. Findings consistent with poly lobar pneumonia. The left lung is clear. No pleural effusions. No worrisome pulmonary lesions. Musculoskeletal: No significant bony findings. CT ABDOMEN PELVIS FINDINGS Hepatobiliary: No hepatic lesions or intrahepatic biliary dilatation. Mild diffuse fatty infiltration of the liver is noted. The gallbladder is unremarkable. No intra or extrahepatic biliary dilatation. Pancreas: No mass, inflammation or ductal dilatation. Spleen: Mild stable splenomegaly. The spleen measures 15.5 x 13.0 x 11.0 cm. No focal lesions. Adrenals/Urinary Tract: 3.4 x 2.8 cm right adrenal gland lesion unchanged since 2017 and consistent with benign adenoma. The left adrenal gland is normal. The kidneys are unremarkable and  stable. The bladder is unremarkable. Stomach/Bowel: Stomach the stomach, duodenum, small bowel and colon are grossly normal without oral contrast. No acute inflammatory changes, mass lesions or obstructive findings. The terminal ileum is normal. The appendix is normal. Vascular/Lymphatic: Slightly progressive aortic calcifications. No aneurysm or dissection. The branch vessels are patent. The major venous structures are patent. No mesenteric or retroperitoneal mass or adenopathy. Reproductive: The prostate gland and seminal vesicles are unremarkable. Other: No pelvic mass or adenopathy. No free pelvic fluid collections. No inguinal mass or adenopathy. No abdominal wall hernia or subcutaneous lesions. Musculoskeletal: No significant bony findings. IMPRESSION: 1. Polylobar pneumonia involving the right upper lobe and right lower lobe. 2. Borderline mediastinal and right hilar lymph nodes, likely reactive/inflammatory due to the right lung process. 3. Age advanced three-vessel coronary artery calcifications. 4. Stable right adrenal gland adenoma. 5. Stable mild splenomegaly. 6. Mild diffuse fatty infiltration of the liver. 7. Aortic atherosclerosis. Aortic Atherosclerosis (ICD10-I70.0). Electronically Signed   By: Marijo Sanes M.D.   On: 02/10/2020 16:08   DG Chest Port 1 View  Result Date: 02/09/2020 CLINICAL DATA:  Central chest pain for 2 days, shortness of breath EXAM: PORTABLE CHEST 1 VIEW COMPARISON:  Coronary CT 03/25/2019, radiograph 11/30/2011 FINDINGS: Mild central vascular congestion with some hazy opacity towards the lung bases. Question some peripheral septal lines as well. Cardiac silhouette appears enlarged from prior portable radiography though could be accentuated by some low lung volumes. The aorta is calcified. The remaining cardiomediastinal contours are unremarkable. No pneumothorax or effusion. No acute osseous or soft tissue abnormality. Degenerative changes are present in the imaged spine  and shoulders. IMPRESSION: 1. Mild central vascular congestion with some hazy opacity towards the lung bases, suspicious for mild interstitial edema though atypical infection could present similarly given the setting of fever. Atelectatic changes likely present as well. 2. Cardiac silhouette appears enlarged from prior portable radiography though could be accentuated by some low lung volumes. Electronically Signed   By: Lovena Le M.D.   On: 02/09/2020 20:39        Scheduled Meds: . amLODipine  5 mg Oral Daily  . aspirin EC  81 mg Oral Daily  . atorvastatin  20 mg Oral Daily  . enoxaparin (LOVENOX) injection  60 mg Subcutaneous Q24H  . fenofibrate  54 mg  Oral Daily  . insulin aspart  0-20 Units Subcutaneous TID WC  . irbesartan  300 mg Oral Daily   Continuous Infusions: . sodium chloride 75 mL/hr at 02/10/20 2228  . ceFEPime (MAXIPIME) IV Stopped (02/11/20 0631)  . vancomycin Stopped (02/11/20 0503)     LOS: 1 day   Time spent: 68min  Brittinie Wherley C Dezeray Puccio, DO [facility]  If 7PM-7AM, please contact night-coverage [email or web address]  02/11/2020, 8:07 AM

## 2020-02-11 NOTE — Progress Notes (Signed)
  Echocardiogram 2D Echocardiogram has been performed.  Christian [name] 02/11/2020, 3:08 PM

## 2020-02-12 DIAGNOSIS — B349 Viral infection, unspecified: Secondary | ICD-10-CM | POA: Diagnosis not present

## 2020-02-12 LAB — BASIC METABOLIC PANEL
Anion gap: 13 (ref 5–15)
BUN: 18 mg/dL (ref 6–20)
CO2: 23 mmol/L (ref 22–32)
Calcium: 8.9 mg/dL (ref 8.9–10.3)
Chloride: 97 mmol/L — ABNORMAL LOW (ref 98–111)
Creatinine, Ser: 0.94 mg/dL (ref 0.61–1.24)
GFR calc Af Amer: >60 mL/min (ref >60)
GFR calc non Af Amer: >60 mL/min (ref >60)
Glucose, Bld: 108 mg/dL — ABNORMAL HIGH (ref 70–99)
Potassium: 3.8 mmol/L (ref 3.5–5.1)
Sodium: 133 mmol/L — ABNORMAL LOW (ref 135–145)

## 2020-02-12 LAB — CBC
HCT: 33.5 % — ABNORMAL LOW (ref 39.0–52.0)
Hemoglobin: 10.9 g/dL — ABNORMAL LOW (ref 13.0–17.0)
MCH: 30.9 pg (ref 26.0–34.0)
MCHC: 32.5 g/dL (ref 30.0–36.0)
MCV: 94.9 fL (ref 80.0–100.0)
Platelets: 179 10*3/uL (ref 150–400)
RBC: 3.53 MIL/uL — ABNORMAL LOW (ref 4.22–5.81)
RDW: 13.5 % (ref 11.5–15.5)
WBC: 8.1 10*3/uL (ref 4.0–10.5)
nRBC: 0 % (ref 0.0–0.2)

## 2020-02-12 LAB — URINALYSIS, ROUTINE W REFLEX MICROSCOPIC
Bilirubin Urine: NEGATIVE
Glucose, UA: 50 mg/dL — AB
Ketones, ur: NEGATIVE mg/dL
Leukocytes,Ua: NEGATIVE
Nitrite: NEGATIVE
Protein, ur: 30 mg/dL — AB
Specific Gravity, Urine: 1.023 (ref 1.005–1.030)
pH: 5 (ref 5.0–8.0)

## 2020-02-12 LAB — GLUCOSE, CAPILLARY
Glucose-Capillary: 122 mg/dL — ABNORMAL HIGH (ref 70–99)
Glucose-Capillary: 156 mg/dL — ABNORMAL HIGH (ref 70–99)
Glucose-Capillary: 152 mg/dL — ABNORMAL HIGH (ref 70–99)
Glucose-Capillary: 126 mg/dL — ABNORMAL HIGH (ref 70–99)

## 2020-02-12 MED ORDER — METRONIDAZOLE IN NACL 5-0.79 MG/ML-% IV SOLN
500.0000 mg | Freq: Three times a day (TID) | INTRAVENOUS | Status: DC
  Administered 2020-02-12 – 2020-02-16 (×12): 500 mg via INTRAVENOUS
  Filled 2020-02-12 (×12): qty 100

## 2020-02-12 MED ORDER — ENOXAPARIN SODIUM 60 MG/0.6ML ~~LOC~~ SOLN
0.5000 mg/kg | SUBCUTANEOUS | Status: DC
  Administered 2020-02-12 – 2020-02-13 (×2): 57.5 mg via SUBCUTANEOUS
  Filled 2020-02-12 (×3): qty 0.6
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
## 2020-02-12 NOTE — Progress Notes (Signed)
0900: Pt wife Lyndee Leo called with concerns that pt was initially identified as DNR.  Pt realized this and it was changed when pt asked his RN about it, however, wife concerned that a mistake like this was made.  CSW spoke with supervisor Barbette Or who advised she discuss with pt MD.  Pt MD was informed and requested to make contact with wife for update on pt as well as this issue.   1455: CSW spoke with pt wife again and she had spoken with MD earlier and discussed DNR issue.  CSW also gave her patient experience phone number.  Wife appreciated MD follow up. Lurline Idol, MSW, LCSW 9/29/20213:00 PM

## 2020-02-12 NOTE — Progress Notes (Signed)
PROGRESS NOTE  [name]  DOB: 04-30-1961  PCP: Cassandria Anger, MD TDV:[id number]  DOA: 02/09/2020  LOS: 2 days   Chief Complaint  Patient presents with  . Chest Pain    Brief narrative: Christian [name] is a 59 y.o. male with PMH of HTN, HLD, low-grade bladder cancer status post TURBT in 2018, chronic pain, gout, obesity, alcohol abuse. Patient presented to the ED on 02/09/2020 with 4 days history of high-grade fever, chills, generalized body ache, weakness, chest pain. Not vaccinated against COVID-19.  In the ED, patient had a fever of 103.1, tachycardia, tachypnea. COVID-19 PCR negative. WBC count elevated to 10.6, sodium 133, lactic acid normal CT chest showed multilobar pneumonia on the right side.   Patient was admitted to hospitalist service.  Subjective: Patient was seen and examined this morning. Middle-aged Caucasian male.  Sitting up in chair.  Not in distress.  He was having low-grade fever and sweating at the time of my evaluation. Continues to have generalized body ache but gradually improving.  Feels better than at presentation. Chart reviewed. Fever of 102.7 yesterday afternoon.  100.6 this morning. Blood pressure 154/80 this morning. Labs this morning with sodium 133, creatinine normal, WBC count normal at 8.1  Assessment/Plan: Sepsis secondary to multilobar pneumonia Persistent fever -Community-acquired versus aspiration pneumonia related to alcoholism -Met sepsis criteria on admission with a fever of 102.1, tachycardia, tachypnea. -Covid and influenza PCR negative. -Currently on IV cefepime and IV vancomycin. -No growth in blood culture.  -WBC count has normalized but patient continues to have high-grade fever.  CT scan obtained on admission did not show any evidence of lung abscess. -Continue broad-spectrum antibiotics for now. -Continue maintenance IV fluid Recent Labs  Lab 02/09/20 1954 02/10/20 0330 02/10/20 0840 02/11/20 0440  02/12/20 0059  WBC 10.6*  --   --   --  8.1  LATICACIDVEN  --  1.4  --   --   --   PROCALCITON  --   --  2.70 2.19  --    Pleuritic chest pain, resolving -Troponin essentially flat. EKG no acute changes. -Echo with EF 60 to 65% and grade 1 diastolic dysfunction. -CT chest showed age advanced three-vessel coronary artery calcifications.  Recommend ischemia evaluation as an outpatient. Recent Labs    02/10/20 0305 02/10/20 0840 02/10/20 1200  CKTOTAL  --  57  --   TROPONINIHS 26* 22* 30*   Chronic alcohol abuse Hepatic steatosis -per CT scan -Drinks 3-4 beers every night -Monitor for signs of withdrawal -Counseled about cessation. Recent Labs  Lab 02/10/20 0311  AST 32  ALT 46*  ALKPHOS 62  BILITOT 1.1  PROT 8.3*  ALBUMIN 3.8   Essential hypertension, stable -Home meds include Norvasc 5 mg daily, irbesartan 300 mg daily -Currently continued on both.  Continue to monitor blood pressure.  Hyperlipidemia: -Continue statin, fenofibrate  Chronic gout: -Currently not in pain.  Low back pain/chronic pain:  Continue Norco as needed  Generalized anxiety disorder:  -Continue Klonopin as needed  Obesity - Body mass index is 38.77 kg/m. Patient has been advised to make an attempt to improve diet and exercise patterns to aid in weight loss.  May have possible underlying sleep apnea.  Recommend sleep study as an outpatient  H/o low-grade bladder cancer status post TURBT in 2018.  Continue Flomax  Chronic fatigue: Chronic: On testosteronegel. Right adrenal gland adenoma -stable per CT scan  Mobility: Encourage ambulation Code Status:   Code Status: Full Code  Nutritional status: Body mass index is 38.77 kg/m.     Diet Order            Diet heart healthy/carb modified Room service appropriate? Yes; Fluid consistency: Thin  Diet effective now                DVT prophylaxis: Lovenox subcu Antimicrobials:  IV cefepime, IV vancomycin Fluid: Normal saline  at 75 mill per hour Consultants: None Family Communication: : called and updated patient's wife this afternoon.  Status is: Inpatient  Remains inpatient appropriate because:Ongoing active pain requiring inpatient pain management and Ongoing diagnostic testing needed not appropriate for outpatient work up   Dispo: The patient is from: Home              Anticipated d/c is to: Home              Anticipated d/c date is: 2 days              Patient currently is not medically stable to d/c.       Infusions:  . sodium chloride 75 mL/hr at 02/12/20 0810  . ceFEPime (MAXIPIME) IV 2 g (02/12/20 1222)  . metronidazole 500 mg (02/12/20 1025)  . vancomycin 1,000 mg (02/12/20 0104)    Scheduled Meds: . amLODipine  5 mg Oral Daily  . aspirin EC  81 mg Oral Daily  . atorvastatin  20 mg Oral Daily  . fenofibrate  54 mg Oral Daily  . insulin aspart  0-20 Units Subcutaneous TID WC  . irbesartan  300 mg Oral Daily    Antimicrobials: Anti-infectives (From admission, onward)   Start     Dose/Rate Route Frequency Ordered Stop   02/12/20 0930  metroNIDAZOLE (FLAGYL) IVPB 500 mg        500 mg 100 mL/hr over 60 Minutes Intravenous Every 8 hours 02/12/20 0928     02/11/20 0200  vancomycin (VANCOCIN) IVPB 1000 mg/200 mL premix        1,000 mg 200 mL/hr over 60 Minutes Intravenous Every 12 hours 02/10/20 1208     02/10/20 1230  vancomycin (VANCOREADY) IVPB 2000 mg/400 mL        2,000 mg 200 mL/hr over 120 Minutes Intravenous  Once 02/10/20 1208 02/10/20 1912   02/10/20 1230  ceFEPIme (MAXIPIME) 2 g in sodium chloride 0.9 % 100 mL IVPB        2 g 200 mL/hr over 30 Minutes Intravenous Every 8 hours 02/10/20 1208        PRN meds: [DISCONTINUED] acetaminophen **OR** acetaminophen, clonazePAM, HYDROcodone-acetaminophen, ipratropium-albuterol, ketorolac, morphine injection, nitroGLYCERIN   Objective: Vitals:   02/12/20 0806 02/12/20 1026  BP: (!) 154/80   Pulse: 94   Resp: (!) 28   Temp:  (!) 100.6 F (38.1 C) 99.5 F (37.5 C)  SpO2: 95%     Intake/Output Summary (Last 24 hours) at 02/12/2020 1241 Last data filed at 02/12/2020 0700 Gross per 24 hour  Intake 297.7 ml  Output 750 ml  Net -452.3 ml   Filed Weights   02/10/20 0329  Weight: 115.7 kg   Weight change:  Body mass index is 38.77 kg/m.   Physical Exam: General exam: Appears calm and comfortable.  Complains of generalized weakness and malaise Skin: No rashes, lesions or ulcers. HEENT: Atraumatic, normocephalic, supple neck, no obvious bleeding Lungs: Clear to auscultation bilaterally CVS: Regular rate and rhythm, no murmur GI/Abd soft, nontender, nondistended, bowel sound present CNS: Alert, awake, oriented x3 Psychiatry: Mood  appropriate Extremities: No pedal edema, no calf tenderness  Data Review: I have personally reviewed the laboratory data and studies available.  Recent Labs  Lab 02/09/20 1954 02/12/20 0059  WBC 10.6* 8.1  HGB 12.6* 10.9*  HCT 38.8* 33.5*  MCV 97.0 94.9  PLT 189 179   Recent Labs  Lab 02/09/20 1954 02/10/20 0840 02/12/20 0059  NA 133*  --  133*  K 3.7  --  3.8  CL 97*  --  97*  CO2 21*  --  23  GLUCOSE 228*  --  108*  BUN 8  --  18  CREATININE 0.95  --  0.94  CALCIUM 9.5  --  8.9  MG  --  2.0  --     F/u labs ordered  Signed, Terrilee Croak, MD [facility] 02/12/2020

## 2020-02-12 NOTE — Plan of Care (Signed)
  Problem: Coping: Goal: Level of anxiety will decrease Outcome: Progressing   Problem: Elimination: Goal: Will not experience complications related to bowel motility Outcome: Progressing   Problem: Elimination: Goal: Will not experience complications related to urinary retention Outcome: Progressing   Problem: Pain Managment: Goal: General experience of comfort will improve Outcome: Progressing   Problem: Safety: Goal: Ability to remain free from injury will improve Outcome: Progressing   Problem: Skin Integrity: Goal: Risk for impaired skin integrity will decrease Outcome: Progressing   

## 2020-02-13 ENCOUNTER — Inpatient Hospital Stay (HOSPITAL_COMMUNITY): Payer: 59

## 2020-02-13 ENCOUNTER — Telehealth: Payer: Self-pay | Admitting: Internal Medicine

## 2020-02-13 DIAGNOSIS — B349 Viral infection, unspecified: Secondary | ICD-10-CM | POA: Diagnosis not present

## 2020-02-13 LAB — BASIC METABOLIC PANEL
Anion gap: 11 (ref 5–15)
BUN: 13 mg/dL (ref 6–20)
CO2: 24 mmol/L (ref 22–32)
Calcium: 8.6 mg/dL — ABNORMAL LOW (ref 8.9–10.3)
Chloride: 97 mmol/L — ABNORMAL LOW (ref 98–111)
Creatinine, Ser: 0.8 mg/dL (ref 0.61–1.24)
GFR calc Af Amer: >60 mL/min (ref >60)
GFR calc non Af Amer: >60 mL/min (ref >60)
Glucose, Bld: 118 mg/dL — ABNORMAL HIGH (ref 70–99)
Potassium: 3.9 mmol/L (ref 3.5–5.1)
Sodium: 132 mmol/L — ABNORMAL LOW (ref 135–145)

## 2020-02-13 LAB — CBC
HCT: 30.8 % — ABNORMAL LOW (ref 39.0–52.0)
Hemoglobin: 10.3 g/dL — ABNORMAL LOW (ref 13.0–17.0)
MCH: 31.8 pg (ref 26.0–34.0)
MCHC: 33.4 g/dL (ref 30.0–36.0)
MCV: 95.1 fL (ref 80.0–100.0)
Platelets: 195 10*3/uL (ref 150–400)
RBC: 3.24 MIL/uL — ABNORMAL LOW (ref 4.22–5.81)
RDW: 13.3 % (ref 11.5–15.5)
WBC: 7.5 10*3/uL (ref 4.0–10.5)
nRBC: 0 % (ref 0.0–0.2)

## 2020-02-13 LAB — MRSA PCR SCREENING: MRSA by PCR: NEGATIVE

## 2020-02-13 LAB — LACTIC ACID, PLASMA: Lactic Acid, Venous: 0.9 mmol/L (ref 0.5–1.9)

## 2020-02-13 LAB — PROCALCITONIN: Procalcitonin: 0.95 ng/mL

## 2020-02-13 LAB — GLUCOSE, CAPILLARY
Glucose-Capillary: 122 mg/dL — ABNORMAL HIGH (ref 70–99)
Glucose-Capillary: 130 mg/dL — ABNORMAL HIGH (ref 70–99)
Glucose-Capillary: 114 mg/dL — ABNORMAL HIGH (ref 70–99)
Glucose-Capillary: 126 mg/dL — ABNORMAL HIGH (ref 70–99)

## 2020-02-13 MED ORDER — ALBUTEROL SULFATE HFA 108 (90 BASE) MCG/ACT IN AERS
1.0000 | INHALATION_SPRAY | Freq: Four times a day (QID) | RESPIRATORY_TRACT | Status: DC | PRN
  Administered 2020-02-13 (×2): 1 via RESPIRATORY_TRACT
  Filled 2020-02-13 (×3): qty 6.7

## 2020-02-13 MED ORDER — ALBUTEROL SULFATE HFA 108 (90 BASE) MCG/ACT IN AERS
1.0000 | INHALATION_SPRAY | RESPIRATORY_TRACT | Status: DC | PRN
  Administered 2020-02-13 – 2020-02-14 (×2): 1 via RESPIRATORY_TRACT
  Filled 2020-02-13: qty 6.7

## 2020-02-13 MED ORDER — LORATADINE 10 MG PO TABS
10.0000 mg | ORAL_TABLET | Freq: Every day | ORAL | Status: DC
  Administered 2020-02-13 – 2020-02-17 (×5): 10 mg via ORAL
  Filled 2020-02-13 (×5): qty 1

## 2020-02-13 NOTE — Progress Notes (Addendum)
Pt asking for his inhaler (MDI) not time for inhaler since last given at 1217.  pt's lung sounds with rhonchi, wheezing and fine crackles in bases.  Respiratory rate upper 20's to 30's. Pt also receiving NS at 75cc/hr, and taking po well.  RT called to request them to come eval pt, she suggested to call MD to have MDI  frequency increased since neb that was ordered was Q 2hr prn.     Chat message sent to Dr Pietro Cassis, response received and orders to be written. Plan to increase MDI to q4hr prn, PCXR and IVF could be d/c'd.  Pt informed of the plan, MDI given.  Pt anxiety decreased some, pt gets anxious and this also reflects increased RR. Pt had his klonopine this morning, only ordered 2xday prn  and encouraged him to wait til later tonight in case he needed it more then. Pt agreed.    At 1625 .Marland KitchenMarland KitchenMarland KitchenDr Pietro Cassis rounded on pt in person at 59. Pt's wife had come in and pt got in the shower, took monitor off and showered with no order.  Dr Pietro Cassis aware of pt in the shower as he was in it when he came by. PCXR still to be done, waiting radiology to come.

## 2020-02-13 NOTE — Telephone Encounter (Signed)
Patient is in the hospital and wants to get advice from Dr. Delila Pereyra, (Wife) would like a call, doesn't feel like they are getting answers  [phone]

## 2020-02-13 NOTE — Progress Notes (Signed)
PROGRESS NOTE  Christian [name]  DOB: 03-09-1961  PCP: Cassandria Anger, MD OVF:[id number]  DOA: 02/09/2020  LOS: 3 days   Chief Complaint  Patient presents with  . Chest Pain    Brief narrative: Christian [name] is a 59 y.o. male with PMH of HTN, HLD, low-grade bladder cancer status post TURBT in 2018, chronic pain, gout, obesity, alcohol abuse. Patient presented to the ED on 02/09/2020 with 4 days history of high-grade fever, chills, generalized body ache, weakness, chest pain. Not vaccinated against COVID-19.  In the ED, patient had a fever of 103.1, tachycardia, tachypnea. COVID-19 PCR negative. WBC count elevated to 10.6, sodium 133, lactic acid normal CT chest showed multilobar pneumonia on the right side.   Patient was admitted to hospitalist service.  Subjective: Patient was seen and examined this morning. Sitting up in chair.  Feels anxious.  Feels generalized pain and headache. T-max 100.6, 3 episodes in the last 24 hours. Wife was at bedside.  I had a long conversation.  Answered all questions to satisfaction.  Assessment/Plan: Sepsis secondary to multilobar pneumonia Persistent fever -Community-acquired versus aspiration pneumonia related to alcoholism -Met sepsis criteria on admission with a fever of 102.1, tachycardia, tachypnea. -Covid and influenza PCR negative. -Currently on broad-spectrum antibiotics.   -WBC normalized, lactic acid level remains normal, not in respiratory distress, not on supplemental oxygen.  But patient continues to have low-grade fever but overall fever spikes trending down.   -Okay to stop IV vancomycin.  Continue IV cefepime and IV Flagyl.   -No growth in blood culture so far.  Send sputum culture.  -Continue maintenance IV fluid Recent Labs  Lab 02/09/20 1954 02/10/20 0330 02/10/20 0840 02/11/20 0440 02/12/20 0059 02/13/20 0115  WBC 10.6*  --   --   --  8.1 7.5  LATICACIDVEN  --  1.4  --   --   --  0.9  PROCALCITON  --    --  2.70 2.19  --  0.95   Pleuritic chest pain, resolving -Troponin essentially flat. EKG no acute changes. -Echo with EF 60 to 65% and grade 1 diastolic dysfunction. -CT chest showed age advanced three-vessel coronary artery calcifications.  Recommend ischemia evaluation as an outpatient. Recent Labs    02/10/20 1200  TROPONINIHS 30*   Chronic alcohol abuse Hepatic steatosis -per CT scan -Drinks 3-4 beers every night -Monitor for signs of withdrawal -Counseled about cessation. Recent Labs  Lab 02/10/20 0311  AST 32  ALT 46*  ALKPHOS 62  BILITOT 1.1  PROT 8.3*  ALBUMIN 3.8    Hyponatremia -mild -Monitor Recent Labs  Lab 02/09/20 1954 02/12/20 0059 02/13/20 0115  NA 133* 133* 132*   Essential hypertension, stable -Home meds include Norvasc 5 mg daily, irbesartan 300 mg daily -Currently continued on both.  Continue to monitor blood pressure.  Hyperlipidemia: -Continue statin, fenofibrate  Chronic gout: -Currently not in pain.  Low back pain/chronic pain:  Continue Norco as needed  Generalized anxiety disorder:  -Continue Klonopin as needed.  Patient may need more antianxiety medications as he tends to use more alcohol with uncontrolled anxiety  Obesity - Body mass index is 38.77 kg/m. Patient has been advised to make an attempt to improve diet and exercise patterns to aid in weight loss.  May have possible underlying sleep apnea.  Recommend sleep study as an outpatient  H/o low-grade bladder cancer status post TURBT in 2018.  Continue Flomax  Chronic fatigue: Chronic: On testosteronegel. Right adrenal gland adenoma -stable  per CT scan  Mobility: Encourage ambulation Code Status:   Code Status: Full Code  Nutritional status: Body mass index is 38.77 kg/m.     Diet Order            Diet heart healthy/carb modified Room service appropriate? Yes; Fluid consistency: Thin  Diet effective now                DVT prophylaxis: Lovenox  subcu Antimicrobials:  IV cefepime, IV Flagyl Fluid: Normal saline at 75 mill per hour Consultants: None Family Communication: : called and updated patient's wife this afternoon.  Status is: Inpatient  Remains inpatient appropriate because: Continues to have fever.  Needs IV antibiotics.  Dispo: The patient is from: Home              Anticipated d/c is to: Home              Anticipated d/c date is: 2 days              Patient currently is not medically stable to d/c.  Infusions:  . sodium chloride 75 mL/hr at 02/13/20 0534  . ceFEPime (MAXIPIME) IV 2 g (02/13/20 0405)  . metronidazole 500 mg (02/13/20 0847)  . vancomycin 1,000 mg (02/13/20 0230)    Scheduled Meds: . amLODipine  5 mg Oral Daily  . aspirin EC  81 mg Oral Daily  . atorvastatin  20 mg Oral Daily  . enoxaparin (LOVENOX) injection  0.5 mg/kg Subcutaneous Q24H  . fenofibrate  54 mg Oral Daily  . insulin aspart  0-20 Units Subcutaneous TID WC  . irbesartan  300 mg Oral Daily  . loratadine  10 mg Oral Daily    Antimicrobials: Anti-infectives (From admission, onward)   Start     Dose/Rate Route Frequency Ordered Stop   02/12/20 0930  metroNIDAZOLE (FLAGYL) IVPB 500 mg        500 mg 100 mL/hr over 60 Minutes Intravenous Every 8 hours 02/12/20 0928     02/11/20 0200  vancomycin (VANCOCIN) IVPB 1000 mg/200 mL premix        1,000 mg 200 mL/hr over 60 Minutes Intravenous Every 12 hours 02/10/20 1208     02/10/20 1230  vancomycin (VANCOREADY) IVPB 2000 mg/400 mL        2,000 mg 200 mL/hr over 120 Minutes Intravenous  Once 02/10/20 1208 02/10/20 1912   02/10/20 1230  ceFEPIme (MAXIPIME) 2 g in sodium chloride 0.9 % 100 mL IVPB        2 g 200 mL/hr over 30 Minutes Intravenous Every 8 hours 02/10/20 1208        PRN meds: [DISCONTINUED] acetaminophen **OR** acetaminophen, albuterol, clonazePAM, HYDROcodone-acetaminophen, morphine injection, nitroGLYCERIN   Objective: Vitals:   02/13/20 0530 02/13/20 0828  BP:   (!) 152/84  Pulse:  88  Resp:  (!) 28  Temp: 100.2 F (37.9 C) 99.3 F (37.4 C)  SpO2:  95%    Intake/Output Summary (Last 24 hours) at 02/13/2020 1050 Last data filed at 02/13/2020 0544 Gross per 24 hour  Intake 4263.18 ml  Output 1550 ml  Net 2713.18 ml   Filed Weights   02/10/20 0329  Weight: 115.7 kg   Weight change:  Body mass index is 38.77 kg/m.   Physical Exam: General exam: Slightly anxious.  Not in physical distress Skin: No rashes, lesions or ulcers. HEENT: Atraumatic, normocephalic, supple neck, no obvious bleeding Lungs: On the right side, patient continues to have bronchial breath sounds left  midlung..  Clear to auscultation on the left.  No wheezing. CVS: Regular rate and rhythm, no murmur GI/Abd soft, nontender, nondistended, bowel sound present CNS: Alert, awake, oriented x3 Psychiatry: Anxious mood Extremities: No pedal edema, no calf tenderness  Data Review: I have personally reviewed the laboratory data and studies available.  Recent Labs  Lab 02/09/20 1954 02/12/20 0059 02/13/20 0115  WBC 10.6* 8.1 7.5  HGB 12.6* 10.9* 10.3*  HCT 38.8* 33.5* 30.8*  MCV 97.0 94.9 95.1  PLT 189 179 195   Recent Labs  Lab 02/09/20 1954 02/10/20 0840 02/12/20 0059 02/13/20 0115  NA 133*  --  133* 132*  K 3.7  --  3.8 3.9  CL 97*  --  97* 97*  CO2 21*  --  23 24  GLUCOSE 228*  --  108* 118*  BUN 8  --  18 13  CREATININE 0.95  --  0.94 0.80  CALCIUM 9.5  --  8.9 8.6*  MG  --  2.0  --   --     F/u labs ordered  Signed, Terrilee Croak, MD [facility] 02/13/2020

## 2020-02-14 DIAGNOSIS — B349 Viral infection, unspecified: Secondary | ICD-10-CM

## 2020-02-14 LAB — CBC
HCT: 28 % — ABNORMAL LOW (ref 39.0–52.0)
Hemoglobin: 9.3 g/dL — ABNORMAL LOW (ref 13.0–17.0)
MCH: 31.8 pg (ref 26.0–34.0)
MCHC: 33.2 g/dL (ref 30.0–36.0)
MCV: 95.9 fL (ref 80.0–100.0)
Platelets: 238 10*3/uL (ref 150–400)
RBC: 2.92 MIL/uL — ABNORMAL LOW (ref 4.22–5.81)
RDW: 13.6 % (ref 11.5–15.5)
WBC: 9.4 10*3/uL (ref 4.0–10.5)
nRBC: 0 % (ref 0.0–0.2)

## 2020-02-14 LAB — GLUCOSE, CAPILLARY
Glucose-Capillary: 119 mg/dL — ABNORMAL HIGH (ref 70–99)
Glucose-Capillary: 122 mg/dL — ABNORMAL HIGH (ref 70–99)
Glucose-Capillary: 147 mg/dL — ABNORMAL HIGH (ref 70–99)
Glucose-Capillary: 152 mg/dL — ABNORMAL HIGH (ref 70–99)

## 2020-02-14 LAB — BASIC METABOLIC PANEL
Anion gap: 9 (ref 5–15)
BUN: 12 mg/dL (ref 6–20)
CO2: 26 mmol/L (ref 22–32)
Calcium: 8.4 mg/dL — ABNORMAL LOW (ref 8.9–10.3)
Chloride: 101 mmol/L (ref 98–111)
Creatinine, Ser: 0.86 mg/dL (ref 0.61–1.24)
GFR calc Af Amer: >60 mL/min (ref >60)
GFR calc non Af Amer: >60 mL/min (ref >60)
Glucose, Bld: 127 mg/dL — ABNORMAL HIGH (ref 70–99)
Potassium: 3.6 mmol/L (ref 3.5–5.1)
Sodium: 136 mmol/L (ref 135–145)

## 2020-02-14 LAB — PROCALCITONIN: Procalcitonin: 0.63 ng/mL

## 2020-02-14 LAB — PHOSPHORUS: Phosphorus: 2.1 mg/dL — ABNORMAL LOW (ref 2.5–4.6)

## 2020-02-14 LAB — TROPONIN I (HIGH SENSITIVITY): Troponin I (High Sensitivity): 22 ng/L — ABNORMAL HIGH (ref <18)

## 2020-02-14 MED ORDER — SODIUM CHLORIDE 0.9 % IV SOLN
1.0000 g | INTRAVENOUS | Status: DC
  Administered 2020-02-14 – 2020-02-15 (×2): 1 g via INTRAVENOUS
  Filled 2020-02-14: qty 10
  Filled 2020-02-14 (×2): qty 1

## 2020-02-14 MED ORDER — PANTOPRAZOLE SODIUM 40 MG PO TBEC
40.0000 mg | DELAYED_RELEASE_TABLET | Freq: Every day | ORAL | Status: DC
  Administered 2020-02-14 – 2020-02-17 (×4): 40 mg via ORAL
  Filled 2020-02-14 (×4): qty 1

## 2020-02-14 MED ORDER — IPRATROPIUM-ALBUTEROL 0.5-2.5 (3) MG/3ML IN SOLN
3.0000 mL | Freq: Four times a day (QID) | RESPIRATORY_TRACT | Status: DC
  Administered 2020-02-14 – 2020-02-15 (×4): 3 mL via RESPIRATORY_TRACT
  Filled 2020-02-14 (×4): qty 3

## 2020-02-14 MED ORDER — MORPHINE SULFATE (PF) 2 MG/ML IV SOLN: 1.0000 mg | INTRAVENOUS | Status: DC | PRN

## 2020-02-14 MED ORDER — SODIUM PHOSPHATES 45 MMOLE/15ML IV SOLN
30.0000 mmol | Freq: Once | INTRAVENOUS | Status: AC
  Administered 2020-02-14: 30 mmol via INTRAVENOUS
  Filled 2020-02-14: qty 10

## 2020-02-14 NOTE — Progress Notes (Signed)
Pharmacy Antibiotic Note  Christian [name] is a 59 y.o. male admitted on 02/09/2020 with sepsis/PNA.  Pharmacy has been consulted for cefepime dosing. Also on Flagyl per MD. Vancomycin d/c'd 9/30 with negative mrsa pcr. SCr stable 0.86.  Plan: Cefepime 2gm IV Q8H Flagyl 500mg  IV q8h per MD Monitor clinical progress, c/s, renal function F/u de-escalation plan/LOT  Height: 5\' 8"  (172.7 cm) Weight: 115.7 kg (255 lb) IBW/kg (Calculated) : 68.4  Temp (24hrs), Avg:99 F (37.2 C), Min:98.5 F (36.9 C), Max:100.4 F (38 C)  Recent Labs  Lab 02/09/20 1954 02/10/20 0330 02/12/20 0059 02/13/20 0115 02/14/20 0153  WBC 10.6*  --  8.1 7.5 9.4  CREATININE 0.95  --  0.94 0.80 0.86  LATICACIDVEN  --  1.4  --  0.9  --     Estimated Creatinine Clearance: 117 mL/min (by C-G formula based on SCr of 0.86 mg/dL).    Allergies  Allergen Reactions  . Lisinopril Cough    Cough   . Lexapro [Escitalopram Oxalate] Other (See Comments)    tired  . Lipitor [Atorvastatin] Other (See Comments)    myalgias    Arturo Morton, PharmD, BCPS Please check AMION for all [facility] contact numbers Clinical Pharmacist 02/14/2020 11:38 AM

## 2020-02-14 NOTE — Consult Note (Addendum)
NAME:  Christian [name], MRN:  [id number], DOB:  1960/08/02, LOS: 4 ADMISSION DATE:  02/09/2020, CONSULTATION DATE:  02/14/20 REFERRING MD:  , CHIEF COMPLAINT:  Respiratory insufficiency  Brief History   59 y.o. M with PMH of asthma, HTN, HL, bladder Ca in remission and active ETOH abuse who was admitted 9/27 with PNA and sepsis.  He was initially treated with Vanc, Zosyn and Cefepime.  CXR with worsening RLL infiltrate, so PCCM consulted for possible bronch.  History of present illness   Christian [name] is a 59 y.o. M with PMH asthma, HTN, HL, bladder Ca in remission and active ETOH abuse who was admitted 9/27 with PNA and sepsis.   CXR initially with hazy opacities in bilateral bases.    He was initially treated broadly with Vanc, Cefepime and Flagyl then Vanc d/c'd when MRSA PCR negative.  He is coughing up white sputum  Last night pt had some de-saturations and required 2L [place], though he states that he has been told he needs a sleep study in the past.  Today, he is back on RA and states his breathing feels slightly heavier but is sitting up in no distress.  He denies any history of tobacco use, ill contacts or recent URI sx's.  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   Past Medical History   has a past medical history of Allergic rhinitis, Anxiety, Arthritis, Asthma, Back pain, Bladder cancer ([place]) (UROLOGIST-  DR MCKENZIE), Chest pain, Dyspnea, ED (erectile dysfunction), First degree heart block, GERD (gastroesophageal reflux disease), H/O concussion (MILD--- NO RESIDUAL), History of panic attacks, History of urinary retention, Hyperlipidemia, Hypertension, Joint pain, LAFB (left anterior fascicular block), Mild asthma, and RBBB (right bundle branch block).   Significant Hospital Events   9/27 Admit to [facility]  Consults:  PCCM  Procedures:    Significant Diagnostic Tests:  9/27 CT chest>>Polylobar pneumonia involving the right upper lobe and right lower lobe. Borderline  mediastinal and right hilar lymph nodes, likely reactive/inflammatory due to the right lung process. 9/30 CXR>>increased R infrahilar and basilar PNA  Micro Data:  9/27 Covid and Flu>>negative 9/27 Sputum>> 9/27 BCx2>> 10/1 Legionella and Strep Antigen  Antimicrobials:   Cefepime 9/27- Flagyl 9/27- Vancomycin 9/27-9/30  Interim history/subjective:  Awake and on room air  Objective   Blood pressure 130/87, pulse 88, temperature 98.6 F (37 C), temperature source Oral, resp. rate (!) 40, height 5\' 8"  (1.727 m), weight 115.7 kg, SpO2 94 %.        Intake/Output Summary (Last 24 hours) at 02/14/2020 1110 Last data filed at 02/14/2020 0158 Gross per 24 hour  Intake 340 ml  Output 1300 ml  Net -960 ml   Filed Weights   02/10/20 0329  Weight: 115.7 kg   General:  Overweight M, sitting up and conversational HEENT: MM pink/moist Neuro: A&O x4 CV: s1s2 rrr, no m/r/g PULM:  No distress RLL crackles, no significant wheezing GI: soft, bsx4 active  Extremities: warm/dry, no edema  Skin: no rashes or lesions   Resolved Hospital Problem list     Assessment & Plan:   Multi-lobar PNA, history of asthma and ETOH abuse Most likely recurrent aspiration, remains on RA without fever or leukocytosis -check modified Barium swallow study and start PPI, CT with esophageal thickening-may benefit from outpatient GI evaluation -Recommend Pulmonary Hygiene, Incentive spirometer -Works as a Development worker, community in new construction/housing send Legionella and Strep antigen  -Continue Flagyl and downgrade to ceftriaxone  -reactive  mediastinal nodes on CT chest, treat inflammatory/infectious process and follow up in Pulmonary clinic in 3 months to ensure resolution     Labs   CBC: Recent Labs  Lab 02/09/20 1954 02/12/20 0059 02/13/20 0115 02/14/20 0153  WBC 10.6* 8.1 7.5 9.4  HGB 12.6* 10.9* 10.3* 9.3*  HCT 38.8* 33.5* 30.8* 28.0*  MCV 97.0 94.9 95.1 95.9  PLT 189 179 195 238    Basic  Metabolic Panel: Recent Labs  Lab 02/09/20 1954 02/10/20 0840 02/12/20 0059 02/13/20 0115 02/14/20 0153  NA 133*  --  133* 132* 136  K 3.7  --  3.8 3.9 3.6  CL 97*  --  97* 97* 101  CO2 21*  --  23 24 26   GLUCOSE 228*  --  108* 118* 127*  BUN 8  --  18 13 12   CREATININE 0.95  --  0.94 0.80 0.86  CALCIUM 9.5  --  8.9 8.6* 8.4*  MG  --  2.0  --   --   --   PHOS  --   --   --   --  2.1*   GFR: Estimated Creatinine Clearance: 117 mL/min (by C-G formula based on SCr of 0.86 mg/dL). Recent Labs  Lab 02/09/20 1954 02/10/20 0330 02/10/20 0840 02/11/20 0440 02/12/20 0059 02/13/20 0115 02/14/20 0153  PROCALCITON  --   --  2.70 2.19  --  0.95 0.63  WBC 10.6*  --   --   --  8.1 7.5 9.4  LATICACIDVEN  --  1.4  --   --   --  0.9  --     Liver Function Tests: Recent Labs  Lab 02/10/20 0311  AST 32  ALT 46*  ALKPHOS 62  BILITOT 1.1  PROT 8.3*  ALBUMIN 3.8   Recent Labs  Lab 02/10/20 1200  LIPASE 24   No results for input(s): AMMONIA in the last 168 hours.  ABG    Component Value Date/Time   TCO2 26 03/17/2017 1042     Coagulation Profile: No results for input(s): INR, PROTIME in the last 168 hours.  Cardiac Enzymes: Recent Labs  Lab 02/10/20 0840  CKTOTAL 57    HbA1C: Hgb A1c MFr Bld  Date/Time Value Ref Range Status  02/10/2020 08:40 AM 5.4 4.8 - 5.6 % Final    Comment:    (NOTE) Pre diabetes:          5.7%-6.4%  Diabetes:              >6.4%  Glycemic control for   <7.0% adults with diabetes   10/29/2019 08:34 AM 6.4 4.6 - 6.5 % Final    Comment:    Glycemic Control Guidelines for People with Diabetes:Non Diabetic:  <6%Goal of Therapy: <7%Additional Action Suggested:  >8%     CBG: Recent Labs  Lab 02/13/20 0628 02/13/20 1226 02/13/20 1709 02/13/20 2004 02/14/20 0845  GLUCAP 122* 130* 114* 126* 119*    Review of Systems:   Negative except as noted in HPI  Past Medical History  He,  has a past medical history of Allergic rhinitis,  Anxiety, Arthritis, Asthma, Back pain, Bladder cancer ([place]) (UROLOGIST-  DR MCKENZIE), Chest pain, Dyspnea, ED (erectile dysfunction), First degree heart block, GERD (gastroesophageal reflux disease), H/O concussion (MILD--- NO RESIDUAL), History of panic attacks, History of urinary retention, Hyperlipidemia, Hypertension, Joint pain, LAFB (left anterior fascicular block), Mild asthma, and RBBB (right bundle branch block).   Surgical History    Past Surgical History:  Procedure Laterality Date  . CARDIOVASCULAR STRESS TEST  03-20-2012   normal nuclear perfusion study w/ no ischemia/  low normal LVF, ef 49% and normal wall motion   . CYSTOSCOPY  09/19/2011   Procedure: CYSTOSCOPY AND PLACEMENT SUPRAPUBIC TUBE;  Surgeon: Molli Hazard, MD;  Location: [place];  Service: Urology;  Laterality: N/A;  Cystoscopy; Open Bladder Repair  . CYSTOSCOPY W/ RETROGRADES  12/14/2011   Procedure: CYSTOSCOPY WITH RETROGRADE PYELOGRAM;  Surgeon: Molli Hazard, MD;  Location: [facility];  Service: Urology;  Laterality: Bilateral;  bilateral retrogrades  . CYSTOSCOPY W/ RETROGRADES Bilateral 09/04/2015   Procedure: CYSTOSCOPY WITH RETROGRADE PYELOGRAM;  Surgeon: Cleon Gustin, MD;  Location: [facility];  Service: Urology;  Laterality: Bilateral;  . CYSTOSCOPY WITH BIOPSY  12/14/2011   Procedure: CYSTOSCOPY WITH BIOPSY;  Surgeon: Molli Hazard, MD;  Location: [facility];  Service: Urology;  Laterality: N/A;  rectal exam  . CYSTOSCOPY WITH FULGERATION N/A 08/05/2016   Procedure: CYSTOSCOPY WITH FULGERATION;  Surgeon: Cleon Gustin, MD;  Location: [facility];  Service: Urology;  Laterality: N/A;  . INGUINAL HERNIA REPAIR  03/30/2012   Procedure: HERNIA REPAIR INGUINAL ADULT;  Surgeon: Odis Hollingshead, MD;  Location: [facility];  Service: General;  Laterality: Right;  Right Inguinal Hernia Repair with Mesh and On-Q Pump Placement  .  INSERTION OF MESH  03/30/2012   Procedure: INSERTION OF MESH;  Surgeon: Odis Hollingshead, MD;  Location: [facility];  Service: General;  Laterality: Right;  . LEFT KNEE SURGERY  x3  last one  2012   ACL REPAIR/ MENISECTOMY/ REMOVAL LOOSE BODIES  . TONSILLECTOMY AND ADENOIDECTOMY  CHILD  . TRANSTHORACIC ECHOCARDIOGRAM  03/15/2012   mild LVH, ef 55-60%/  trivial MR and TR   . TRANSURETHRAL RESECTION OF BLADDER TUMOR N/A 02/22/2016   Procedure: TRANSURETHRAL RESECTION OF BLADDER TUMOR (TURBT);  Surgeon: Cleon Gustin, MD;  Location: [facility];  Service: Urology;  Laterality: N/A;  . TRANSURETHRAL RESECTION OF BLADDER TUMOR N/A 08/05/2016   Procedure: TRANSURETHRAL RESECTION OF BLADDER TUMOR (TURBT);  Surgeon: Cleon Gustin, MD;  Location: [facility];  Service: Urology;  Laterality: N/A;  . TRANSURETHRAL RESECTION OF BLADDER TUMOR N/A 03/17/2017   Procedure: TRANSURETHRAL RESECTION OF BLADDER TUMOR (TURBT);  Surgeon: Cleon Gustin, MD;  Location: [facility];  Service: Urology;  Laterality: N/A;  . TRANSURETHRAL RESECTION OF BLADDER TUMOR WITH GYRUS (TURBT-GYRUS) N/A 09/04/2015   Procedure: TRANSURETHRAL RESECTION OF BLADDER TUMOR WITH GYRUS (TURBT-GYRUS);  Surgeon: Cleon Gustin, MD;  Location: [facility];  Service: Urology;  Laterality: N/A;  . TRANSURETHRAL RESECTION OF BLADDER TUMOR WITH GYRUS (TURBT-GYRUS) N/A 10/16/2015   Procedure: TRANSURETHRAL RESECTION OF BLADDER TUMOR WITH GYRUS (TURBT-GYRUS);  Surgeon: Cleon Gustin, MD;  Location: [facility];  Service: Urology;  Laterality: N/A;     Social History   reports that he has quit smoking. His smoking use included cigars. He has never used smokeless tobacco. He reports current alcohol use of about 12.0 standard drinks of alcohol per week. He reports that he does not use drugs.   Family History   His family history includes Cancer in his father;  Colon cancer in an other family member; Depression in his mother; Diabetes in his brother and another family member; Eating disorder in his mother; Hyperlipidemia in his father and mother; Hypertension in his father; Prostate cancer  in his father.   Allergies Allergies  Allergen Reactions  . Lisinopril Cough    Cough   . Lexapro [Escitalopram Oxalate] Other (See Comments)    tired  . Lipitor [Atorvastatin] Other (See Comments)    myalgias     Home Medications  Prior to Admission medications   Medication Sig Start Date End Date Taking? Authorizing Provider  acetaminophen (TYLENOL) 500 MG tablet Take 1,000 mg by mouth daily.   Yes [provider]  amLODipine (NORVASC) 5 MG tablet Take 1 tablet (5 mg total) by mouth daily. 10/29/19 10/28/20 Yes Plotnikov, Evie Lacks, MD  ascorbic acid (VITAMIN C) 500 MG tablet Take 500 mg by mouth daily.   Yes [provider]  atorvastatin (LIPITOR) 20 MG tablet TAKE 1 TABLET BY MOUTH EVERY DAY Patient taking differently: Take 20 mg by mouth daily.  01/21/20  Yes Plotnikov, Evie Lacks, MD  b complex vitamins capsule Take 1 capsule by mouth once a week.   Yes [provider]  Cholecalciferol (VITAMIN D3) 2000 units capsule Take 1 capsule (2,000 Units total) by mouth daily. 10/20/17  Yes Plotnikov, Evie Lacks, MD  clonazePAM (KLONOPIN) 1 MG tablet TAKE 1 TABLET BY MOUTH TWICE DAILY AS NEEDED FOR  ANXIETY Patient taking differently: Take 1 mg by mouth 2 (two) times daily as needed for anxiety. TAKE 1 TABLET BY MOUTH TWICE DAILY AS NEEDED FOR  ANXIETY 10/29/19  Yes Plotnikov, Evie Lacks, MD  fenofibrate (TRICOR) 145 MG tablet TAKE 1 TABLET BY MOUTH EVERY DAY Patient taking differently: Take 145 mg by mouth daily.  01/21/20  Yes Plotnikov, Evie Lacks, MD  HYDROcodone-acetaminophen (NORCO) 7.5-325 MG tablet Take 0.5-1 tablets by mouth every 6 (six) hours as needed for moderate pain or severe pain. 10/29/19  Yes Plotnikov, Evie Lacks, MD  indomethacin  (INDOCIN) 50 MG capsule TAKE 1 CAPSULE 3 TIMES DAILY AS NEEDED FOR MODERATE PAIN. OFFICE VISIT NEEDED BEFORE REFILLS Patient taking differently: Take 50 mg by mouth 3 (three) times daily as needed for moderate pain.  01/21/20  Yes Plotnikov, Evie Lacks, MD  irbesartan (AVAPRO) 300 MG tablet TAKE 1 TABLET BY MOUTH EVERY DAY Patient taking differently: Take 300 mg by mouth daily.  01/21/20  Yes Plotnikov, Evie Lacks, MD  testosterone (TESTIM) 50 MG/5GM (1%) GEL Place 5 g onto the skin daily. 12/29/19  Yes Plotnikov, Evie Lacks, MD  zinc gluconate 50 MG tablet Take 50 mg by mouth daily.   Yes [provider]  Colchicine 0.6 MG CAPS 1 po qd prn gout attack Patient taking differently: Take 0.6 mg by mouth daily as needed (gout). 1 po qd prn gout attack 02/27/19   Plotnikov, Evie Lacks, MD  tamsulosin (FLOMAX) 0.4 MG CAPS capsule Take 1 capsule (0.4 mg total) by mouth daily after supper. Patient not taking: Reported on 02/10/2020 02/27/19   Plotnikov, Evie Lacks, MD     Critical care time: 35 minutes     CRITICAL CARE Performed by: Otilio Carpen Zuleyka Kloc   Total critical care time: 35 minutes  Critical care time was exclusive of separately billable procedures and treating other patients.  Critical care was necessary to treat or prevent imminent or life-threatening deterioration.  Critical care was time spent personally by me on the following activities: development of treatment plan with patient and/or surrogate as well as nursing, discussions with consultants, evaluation of patient's response to treatment, examination of patient, obtaining history from patient or surrogate, ordering and performing treatments and interventions, ordering and review  of laboratory studies, ordering and review of radiographic studies, pulse oximetry and re-evaluation of patient's condition.  Otilio Carpen Saidi Santacroce, PA-C

## 2020-02-14 NOTE — Progress Notes (Addendum)
Pt needs to be tested for OSA. Pt sats are mid 80s at times while sleeping. Educated pt about OSA and some side effects of it. He seemed agreeable. Nasal cannula 2L applied.

## 2020-02-14 NOTE — Plan of Care (Signed)

## 2020-02-14 NOTE — Telephone Encounter (Signed)
Pt's wife states he has been in the hospital since Sunday and states pt has bacterial pneumonia. Pt wanting to know if you noticed anything at his last OV?  Also pt's wife wanting to know if he's borderline diabetic or is he an actual diabetic?  Pt's wife states that a Dr in the ED suggested pt take him Klonopin more frequently for anxiety instead of excessive eating and drinking alcohol. Pt's wife wants your advise on that

## 2020-02-14 NOTE — Progress Notes (Signed)
PROGRESS NOTE  Christian [name]  DOB: 26-Jan-1961  PCP: Tresa Garter, MD WNE:[id number]  DOA: 02/09/2020  LOS: 4 days   Chief Complaint  Patient presents with  . Chest Pain    Brief narrative: Christian [name] is a 59 y.o. male with PMH of HTN, HLD, low-grade bladder cancer status post TURBT in 2018, chronic pain, gout, obesity, alcohol abuse. Patient presented to the ED on 02/09/2020 with 4 days history of high-grade fever, chills, generalized body ache, weakness, chest pain. Not vaccinated against COVID-19.  In the ED, patient had a fever of 103.1, tachycardia, tachypnea. COVID-19 PCR negative. WBC count elevated to 10.6, sodium 133, lactic acid normal CT chest showed multilobar pneumonia on the right side.   Patient was admitted to hospitalist service. Patient is clinically improving but continues to have intermittent low-grade fever.  He has persistent crackles in the right lung.  Pulmonary consult called today 10/1.  Subjective: Patient was seen and examined this morning. Sitting up at the edge of the bed.  Not in distress.  Not a supplemental oxygen.  Continues to feel better except when he starts getting fever. Getting low-grade fever up to 100.6 intermittently. Wife at bedside.  Answered all questions to satisfaction.  Assessment/Plan: Sepsis secondary to multilobar pneumonia Persistent fever -Community-acquired versus aspiration pneumonia related to alcoholism -Met sepsis criteria on admission with a fever of 102.1, tachycardia, tachypnea. -Covid and influenza PCR negative. -Currently on broad-spectrum antibiotics.   -WBC normalized, lactic acid level remains normal, not in respiratory distress, not on supplemental oxygen.  But patient continues to have low-grade fever.  He has persistent crackles in the right lung.  Pulmonary consultation called. -Continue IV cefepime and IV Flagyl.   -No growth in blood culture so far.  Sputum culture sent. Recent Labs  Lab  02/09/20 1954 02/10/20 0330 02/10/20 0840 02/11/20 0440 02/12/20 0059 02/13/20 0115 02/14/20 0153  WBC 10.6*  --   --   --  8.1 7.5 9.4  LATICACIDVEN  --  1.4  --   --   --  0.9  --   PROCALCITON  --   --  2.70 2.19  --  0.95 0.63   Pleuritic chest pain, resolving -Troponin essentially flat. EKG no acute changes. -Echo with EF 60 to 65% and grade 1 diastolic dysfunction. -CT chest showed age advanced three-vessel coronary artery calcifications.  Recommend ischemia evaluation as an outpatient. Recent Labs    02/14/20 0449  TROPONINIHS 22*   Chronic alcohol abuse Hepatic steatosis -per CT scan -Drinks 3-4 beers every night -Monitor for signs of withdrawal -Counseled about cessation. Recent Labs  Lab 02/10/20 0311  AST 32  ALT 46*  ALKPHOS 62  BILITOT 1.1  PROT 8.3*  ALBUMIN 3.8    Hyponatremia -mild -Improving. Recent Labs  Lab 02/09/20 1954 02/12/20 0059 02/13/20 0115 02/14/20 0153  NA 133* 133* 132* 136   Headache -Likely due to chronic sinusitis.  Essential hypertension, stable -Home meds include Norvasc 5 mg daily, irbesartan 300 mg daily -Currently continued on both.  Continue to monitor blood pressure.  Hyperlipidemia: -Continue statin, fenofibrate  Chronic gout: -Currently not in pain.  Low back pain/chronic pain:  Continue Norco as needed  Generalized anxiety disorder:  -Continue Klonopin as needed.  Patient may need more antianxiety medications as he tends to use more alcohol with uncontrolled anxiety  Obesity - Body mass index is 38.77 kg/m. Patient has been advised to make an attempt to improve diet and exercise patterns  to aid in weight loss.  May have possible underlying sleep apnea.  Recommend sleep study as an outpatient  H/o low-grade bladder cancer status post TURBT in 2018.  Continue Flomax  Chronic fatigue: Chronic: On testosteronegel. Right adrenal gland adenoma -stable per CT scan  Mobility: Encourage  ambulation Code Status:   Code Status: Full Code  Nutritional status: Body mass index is 38.77 kg/m.     Diet Order            Diet heart healthy/carb modified Room service appropriate? Yes; Fluid consistency: Thin  Diet effective now                DVT prophylaxis: Lovenox subcu Antimicrobials:  IV cefepime, IV Flagyl Fluid: Off IV fluid Consultants: None Family Communication: : Patient's wife at bedside  Status is: Inpatient  Remains inpatient appropriate because: Continues to have fever.  Needs IV antibiotics.  Dispo: The patient is from: Home              Anticipated d/c is to: Home              Anticipated d/c date is: 2 days              Patient currently is not medically stable to d/c.  Infusions:  . ceFEPime (MAXIPIME) IV 2 g (02/14/20 1153)  . metronidazole 500 mg (02/14/20 0948)  . sodium phosphate  Dextrose 5% IVPB      Scheduled Meds: . amLODipine  5 mg Oral Daily  . aspirin EC  81 mg Oral Daily  . atorvastatin  20 mg Oral Daily  . enoxaparin (LOVENOX) injection  0.5 mg/kg Subcutaneous Q24H  . fenofibrate  54 mg Oral Daily  . insulin aspart  0-20 Units Subcutaneous TID WC  . irbesartan  300 mg Oral Daily  . loratadine  10 mg Oral Daily    Antimicrobials: Anti-infectives (From admission, onward)   Start     Dose/Rate Route Frequency Ordered Stop   02/12/20 0930  metroNIDAZOLE (FLAGYL) IVPB 500 mg        500 mg 100 mL/hr over 60 Minutes Intravenous Every 8 hours 02/12/20 0928     02/11/20 0200  vancomycin (VANCOCIN) IVPB 1000 mg/200 mL premix  Status:  Discontinued        1,000 mg 200 mL/hr over 60 Minutes Intravenous Every 12 hours 02/10/20 1208 02/13/20 1610   02/10/20 1230  vancomycin (VANCOREADY) IVPB 2000 mg/400 mL        2,000 mg 200 mL/hr over 120 Minutes Intravenous  Once 02/10/20 1208 02/10/20 1912   02/10/20 1230  ceFEPIme (MAXIPIME) 2 g in sodium chloride 0.9 % 100 mL IVPB        2 g 200 mL/hr over 30 Minutes Intravenous Every 8 hours  02/10/20 1208        PRN meds: [DISCONTINUED] acetaminophen **OR** acetaminophen, albuterol, clonazePAM, HYDROcodone-acetaminophen, morphine injection, nitroGLYCERIN   Objective: Vitals:   02/14/20 0847 02/14/20 1200  BP: (!) 144/80 119/61  Pulse:    Resp: (!) 36 18  Temp: 98.7 F (37.1 C) 98.2 F (36.8 C)  SpO2: 91% 94%    Intake/Output Summary (Last 24 hours) at 02/14/2020 1318 Last data filed at 02/14/2020 0900 Gross per 24 hour  Intake 440 ml  Output 1300 ml  Net -860 ml   Filed Weights   02/10/20 0329  Weight: 115.7 kg   Weight change:  Body mass index is 38.77 kg/m.   Physical Exam: General exam:  Slightly anxious. Not in physical distress Skin: No rashes, lesions or ulcers. HEENT: Atraumatic, normocephalic, supple neck, no obvious bleeding Lungs: On the right side, patient continues to have crackles throughout.  Clear to auscultation on the left.  No wheezing. CVS: Regular rate and rhythm, no murmur GI/Abd soft, nontender, nondistended, bowel sound present CNS: Alert, awake, oriented x3 Psychiatry: Mood appropriate Extremities: No pedal edema, no calf tenderness  Data Review: I have personally reviewed the laboratory data and studies available.  Recent Labs  Lab 02/09/20 1954 02/12/20 0059 02/13/20 0115 02/14/20 0153  WBC 10.6* 8.1 7.5 9.4  HGB 12.6* 10.9* 10.3* 9.3*  HCT 38.8* 33.5* 30.8* 28.0*  MCV 97.0 94.9 95.1 95.9  PLT 189 179 195 238   Recent Labs  Lab 02/09/20 1954 02/10/20 0840 02/12/20 0059 02/13/20 0115 02/14/20 0153  NA 133*  --  133* 132* 136  K 3.7  --  3.8 3.9 3.6  CL 97*  --  97* 97* 101  CO2 21*  --  _0 GLUCOSE 228*  --  108* 118* 127*  BUN 8  --  _1 CREATININE 0.95  --  0.94 0.80 0.86  CALCIUM 9.5  --  8.9 8.6* 8.4*  MG  --  2.0  --   --   --   PHOS  --   --   --   --  2.1*    F/u labs ordered  Signed, Terrilee Croak, MD [facility] 02/14/2020

## 2020-02-15 DIAGNOSIS — B349 Viral infection, unspecified: Secondary | ICD-10-CM | POA: Diagnosis not present

## 2020-02-15 LAB — BASIC METABOLIC PANEL
Anion gap: 11 (ref 5–15)
BUN: 8 mg/dL (ref 6–20)
CO2: 28 mmol/L (ref 22–32)
Calcium: 9.1 mg/dL (ref 8.9–10.3)
Chloride: 100 mmol/L (ref 98–111)
Creatinine, Ser: 0.75 mg/dL (ref 0.61–1.24)
GFR calc Af Amer: >60 mL/min (ref >60)
GFR calc non Af Amer: >60 mL/min (ref >60)
Glucose, Bld: 154 mg/dL — ABNORMAL HIGH (ref 70–99)
Potassium: 3.3 mmol/L — ABNORMAL LOW (ref 3.5–5.1)
Sodium: 139 mmol/L (ref 135–145)

## 2020-02-15 LAB — CBC
HCT: 30.8 % — ABNORMAL LOW (ref 39.0–52.0)
Hemoglobin: 10.1 g/dL — ABNORMAL LOW (ref 13.0–17.0)
MCH: 32 pg (ref 26.0–34.0)
MCHC: 32.8 g/dL (ref 30.0–36.0)
MCV: 97.5 fL (ref 80.0–100.0)
Platelets: 288 10*3/uL (ref 150–400)
RBC: 3.16 MIL/uL — ABNORMAL LOW (ref 4.22–5.81)
RDW: 13.4 % (ref 11.5–15.5)
WBC: 8.7 10*3/uL (ref 4.0–10.5)
nRBC: 0 % (ref 0.0–0.2)

## 2020-02-15 LAB — CULTURE, BLOOD (ROUTINE X 2)
Culture: NO GROWTH
Culture: NO GROWTH
Special Requests: ADEQUATE

## 2020-02-15 LAB — GLUCOSE, CAPILLARY
Glucose-Capillary: 139 mg/dL — ABNORMAL HIGH (ref 70–99)
Glucose-Capillary: 197 mg/dL — ABNORMAL HIGH (ref 70–99)
Glucose-Capillary: 154 mg/dL — ABNORMAL HIGH (ref 70–99)
Glucose-Capillary: 132 mg/dL — ABNORMAL HIGH (ref 70–99)

## 2020-02-15 LAB — STREP PNEUMONIAE URINARY ANTIGEN: Strep Pneumo Urinary Antigen: NEGATIVE

## 2020-02-15 MED ORDER — BUDESONIDE 0.25 MG/2ML IN SUSP
0.2500 mg | Freq: Two times a day (BID) | RESPIRATORY_TRACT | Status: DC
  Administered 2020-02-15 – 2020-02-17 (×4): 0.25 mg via RESPIRATORY_TRACT
  Filled 2020-02-15 (×5): qty 2

## 2020-02-15 MED ORDER — REVEFENACIN 175 MCG/3ML IN SOLN
175.0000 ug | Freq: Every day | RESPIRATORY_TRACT | Status: DC
  Administered 2020-02-16 – 2020-02-17 (×2): 175 ug via RESPIRATORY_TRACT
  Filled 2020-02-15 (×3): qty 3

## 2020-02-15 MED ORDER — ARFORMOTEROL TARTRATE 15 MCG/2ML IN NEBU
15.0000 ug | INHALATION_SOLUTION | Freq: Two times a day (BID) | RESPIRATORY_TRACT | Status: DC
  Administered 2020-02-15 – 2020-02-17 (×4): 15 ug via RESPIRATORY_TRACT
  Filled 2020-02-15 (×5): qty 2
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
## 2020-02-15 NOTE — Progress Notes (Signed)
PROGRESS NOTE  Christian [name]  DOB: 26-Apr-1961  PCP: Christian Anger, MD ENI:[id number]  DOA: 02/09/2020  LOS: 5 days   Chief Complaint  Patient presents with  . Chest Pain    Brief narrative: Christian [name] is a 59 y.o. male with PMH of HTN, HLD, low-grade bladder cancer status post TURBT in 2018, chronic pain, gout, obesity, alcohol abuse. Patient presented to the ED on 02/09/2020 with 4 days history of high-grade fever, chills, generalized body ache, weakness, chest pain. Not vaccinated against COVID-19. In the ED, patient had a fever of 103.1, tachycardia, tachypnea. COVID-19 PCR negative. WBC count elevated to 10.6, sodium 133, lactic acid normal CT chest showed multilobar pneumonia on the right side. Patient was admitted to hospitalist service. Patient is clinically improving but continues to have intermittent low-grade fever.  He has persistent crackles in the right lung.  Pulmonary consult called today 10/1.  Subjective: No acute issues or events overnight, patient reports improving respiratory status denies nausea, vomiting, diarrhea, constipation, headache.  Assessment/Plan: Principal Problem:   Viral syndrome Active Problems:   HTN (hypertension)   Anxiety   Dyslipidemia   Obesity   Low back pain   Gout   SIRS (systemic inflammatory response syndrome) (HCC)   Acute hypoxic respiratory failure: POA Sepsis secondary to multilobar pneumonia Persistent fever -Community-acquired versus aspiration pneumonia likely related to alcoholism -Met sepsis criteria on admission with a fever of 102.1, tachycardia, tachypnea. -PCCM following, concern for aspiration pneumonia as well as sleep apnea -Speech evaluation unremarkable, recommending esophagram which is certainly reasonable given his history, wife indicates patient does have episodes of coughing with p.o. intake -Continue IV cefepime and IV Flagyl for 7-day course.   -No growth in blood culture so far.  Sputum culture  sent. Recent Labs  Lab 02/09/20 1954 02/10/20 0330 02/10/20 0840 02/11/20 0440 02/12/20 0059 02/13/20 0115 02/14/20 0153 02/15/20 0124  WBC 10.6*  --   --   --  8.1 7.5 9.4 8.7  LATICACIDVEN  --  1.4  --   --   --  0.9  --   --   PROCALCITON  --   --  2.70 2.19  --  0.95 0.63  --    Pleuritic chest pain, resolving -Troponin essentially flat. EKG no acute changes. -Echo with EF 60 to 65% and grade 1 diastolic dysfunction. -CT chest showed age advanced three-vessel coronary artery calcifications.  Recommend ischemia evaluation as an outpatient.  Chronic alcohol abuse Hepatic steatosis -per CT scan -Drinks 3-4 beers every night -Monitor for signs of withdrawal -Counseled about cessation. Recent Labs  Lab 02/10/20 0311  AST 32  ALT 46*  ALKPHOS 62  BILITOT 1.1  PROT 8.3*  ALBUMIN 3.8     Essential hypertension, stable -Home meds include Norvasc 5 mg daily, irbesartan 300 mg daily -Currently continued on both.  Continue to monitor blood pressure.  Hyperlipidemia: -Continue statin, fenofibrate  Chronic gout: -Currently not in pain.  Low back pain/chronic pain:  Continue Norco as needed  Generalized anxiety disorder:  -Continue Klonopin as needed.  Patient may need more antianxiety medications as he tends to use more alcohol with uncontrolled anxiety  Obesity - Body mass index is 38.77 kg/m. Patient has been advised to make an attempt to improve diet and exercise patterns to aid in weight loss.  May have possible underlying sleep apnea.  Recommend sleep study as an outpatient  H/o low-grade bladder cancer status post TURBT in 2018.  Continue Flomax Chronic  fatigue: On testosteronegel Right adrenal gland adenoma -stable per CT scan  Mobility: Encourage ambulation Code Status:   Code Status: Full Code  Nutritional status: Body mass index is 38.77 kg/m.     Diet Order            Diet heart healthy/carb modified Room service appropriate? Yes; Fluid  consistency: Thin  Diet effective now                DVT prophylaxis: Lovenox subcu Antimicrobials:  IV ceftriaxone, IV Flagyl for 5-day course Fluid: Off IV fluid Consultants: None Family Communication: : Patient's wife at bedside  Status is: Inpatient  Remains inpatient appropriate because: Continues to be hypoxic from baseline with ongoing pneumonia versus pneumonitis.  Dispo: The patient is from: Home              Anticipated d/c is to: Home              Anticipated d/c date is: 2 days              Patient currently is not medically stable to d/c.  Infusions:  . cefTRIAXone (ROCEPHIN)  IV 1 g (02/14/20 1545)  . metronidazole 500 mg (02/15/20 0141)    Scheduled Meds: . amLODipine  5 mg Oral Daily  . aspirin EC  81 mg Oral Daily  . atorvastatin  20 mg Oral Daily  . enoxaparin (LOVENOX) injection  0.5 mg/kg Subcutaneous Q24H  . fenofibrate  54 mg Oral Daily  . insulin aspart  0-20 Units Subcutaneous TID WC  . ipratropium-albuterol  3 mL Nebulization Q6H WA  . irbesartan  300 mg Oral Daily  . loratadine  10 mg Oral Daily  . pantoprazole  40 mg Oral Daily    Antimicrobials: Anti-infectives (From admission, onward)   Start     Dose/Rate Route Frequency Ordered Stop   02/14/20 1530  cefTRIAXone (ROCEPHIN) 1 g in sodium chloride 0.9 % 100 mL IVPB        1 g 200 mL/hr over 30 Minutes Intravenous Every 24 hours 02/14/20 1456     02/12/20 0930  metroNIDAZOLE (FLAGYL) IVPB 500 mg        500 mg 100 mL/hr over 60 Minutes Intravenous Every 8 hours 02/12/20 0928     02/11/20 0200  vancomycin (VANCOCIN) IVPB 1000 mg/200 mL premix  Status:  Discontinued        1,000 mg 200 mL/hr over 60 Minutes Intravenous Every 12 hours 02/10/20 1208 02/13/20 1610   02/10/20 1230  vancomycin (VANCOREADY) IVPB 2000 mg/400 mL        2,000 mg 200 mL/hr over 120 Minutes Intravenous  Once 02/10/20 1208 02/10/20 1912   02/10/20 1230  ceFEPIme (MAXIPIME) 2 g in sodium chloride 0.9 % 100 mL IVPB   Status:  Discontinued        2 g 200 mL/hr over 30 Minutes Intravenous Every 8 hours 02/10/20 1208 02/14/20 1456      PRN meds: [DISCONTINUED] acetaminophen **OR** acetaminophen, albuterol, clonazePAM, HYDROcodone-acetaminophen, morphine injection, nitroGLYCERIN   Objective: Vitals:   02/14/20 2302 02/15/20 0243  BP: (!) 142/76 (!) 152/87  Pulse: 82 82  Resp: 17 20  Temp: 98.5 F (36.9 C) 98.6 F (37 C)  SpO2: 98% 96%    Intake/Output Summary (Last 24 hours) at 02/15/2020 0801 Last data filed at 02/15/2020 0441 Gross per 24 hour  Intake 1007.53 ml  Output 1400 ml  Net -392.47 ml   Filed Weights   02/10/20  0329  Weight: 115.7 kg   Weight change:  Body mass index is 38.77 kg/m.   Physical Exam: General:  Pleasantly resting in bed, No acute distress. HEENT:  Normocephalic atraumatic.  Sclerae nonicteric, noninjected.  Extraocular movements intact bilaterally. Neck:  Without mass or deformity.  Trachea is midline. Lungs: Scant right-sided rhonchi at the base otherwise without overt wheezes rales Heart:  Regular rate and rhythm.  Without murmurs, rubs, or gallops. Abdomen:  Soft, nontender, nondistended.  Without guarding or rebound. Extremities: Without cyanosis, clubbing, edema, or obvious deformity. Vascular:  Dorsalis pedis and posterior tibial pulses palpable bilaterally. Skin:  Warm and dry, no erythema, no ulcerations.   Data Review: I have personally reviewed the laboratory data and studies available.  Recent Labs  Lab 02/09/20 1954 02/12/20 0059 02/13/20 0115 02/14/20 0153 02/15/20 0124  WBC 10.6* 8.1 7.5 9.4 8.7  HGB 12.6* 10.9* 10.3* 9.3* 10.1*  HCT 38.8* 33.5* 30.8* 28.0* 30.8*  MCV 97.0 94.9 95.1 95.9 97.5  PLT 189 179 195 238 288   Recent Labs  Lab 02/09/20 1954 02/10/20 0840 02/12/20 0059 02/13/20 0115 02/14/20 0153 02/15/20 0124  NA 133*  --  133* 132* 136 139  K 3.7  --  3.8 3.9 3.6 3.3*  CL 97*  --  97* 97* 101 100  CO2 21*  --  $R'23  24 26 28  'qi$ GLUCOSE 228*  --  108* 118* 127* 154*  BUN 8  --  $R'18 13 12 8  'Wb$ CREATININE 0.95  --  0.94 0.80 0.86 0.75  CALCIUM 9.5  --  8.9 8.6* 8.4* 9.1  MG  --  2.0  --   --   --   --   PHOS  --   --   --   --  2.1*  --     F/u labs ordered  Signed, Little Ishikawa, DO [facility] 02/15/2020

## 2020-02-16 DIAGNOSIS — B349 Viral infection, unspecified: Secondary | ICD-10-CM | POA: Diagnosis not present

## 2020-02-16 LAB — GLUCOSE, CAPILLARY
Glucose-Capillary: 118 mg/dL — ABNORMAL HIGH (ref 70–99)
Glucose-Capillary: 116 mg/dL — ABNORMAL HIGH (ref 70–99)
Glucose-Capillary: 148 mg/dL — ABNORMAL HIGH (ref 70–99)
Glucose-Capillary: 132 mg/dL — ABNORMAL HIGH (ref 70–99)

## 2020-02-16 LAB — CBC
HCT: 30.4 % — ABNORMAL LOW (ref 39.0–52.0)
Hemoglobin: 9.5 g/dL — ABNORMAL LOW (ref 13.0–17.0)
MCH: 30.7 pg (ref 26.0–34.0)
MCHC: 31.3 g/dL (ref 30.0–36.0)
MCV: 98.4 fL (ref 80.0–100.0)
Platelets: 316 10*3/uL (ref 150–400)
RBC: 3.09 MIL/uL — ABNORMAL LOW (ref 4.22–5.81)
RDW: 13.7 % (ref 11.5–15.5)
WBC: 10.7 10*3/uL — ABNORMAL HIGH (ref 4.0–10.5)
nRBC: 0 % (ref 0.0–0.2)

## 2020-02-16 LAB — BASIC METABOLIC PANEL
Anion gap: 11 (ref 5–15)
BUN: 11 mg/dL (ref 6–20)
CO2: 27 mmol/L (ref 22–32)
Calcium: 9 mg/dL (ref 8.9–10.3)
Chloride: 101 mmol/L (ref 98–111)
Creatinine, Ser: 0.76 mg/dL (ref 0.61–1.24)
GFR calc Af Amer: >60 mL/min (ref >60)
GFR calc non Af Amer: >60 mL/min (ref >60)
Glucose, Bld: 138 mg/dL — ABNORMAL HIGH (ref 70–99)
Potassium: 3.8 mmol/L (ref 3.5–5.1)
Sodium: 139 mmol/L (ref 135–145)

## 2020-02-16 LAB — LEGIONELLA PNEUMOPHILA SEROGP 1 UR AG: L. pneumophila Serogp 1 Ur Ag: NEGATIVE

## 2020-02-16 MED ORDER — AMOXICILLIN-POT CLAVULANATE 875-125 MG PO TABS
1.0000 | ORAL_TABLET | Freq: Two times a day (BID) | ORAL | Status: DC
  Administered 2020-02-16 – 2020-02-17 (×3): 1 via ORAL
  Filled 2020-02-16 (×3): qty 1

## 2020-02-16 NOTE — Telephone Encounter (Addendum)
I have been following his progress.  3 months ago his diabetic test called hemoglobin A1c was normal.  His blood sugar was slightly elevated.  He was borderline diabetic, very mild.  Clonazepam can be used.  However, it normally does not prevent patients from eating or drinking.  We usually do not prescribe it to patients who tend to drink a lot.  We can address these issues when I see him in the follow-up.  Hope he gets to go home soon.  Thanks

## 2020-02-16 NOTE — Progress Notes (Signed)
PROGRESS NOTE  [name]  DOB: 03-02-1961  PCP: Cassandria Anger, MD GUY:[id number]  DOA: 02/09/2020  LOS: 6 days   Chief Complaint  Patient presents with  . Chest Pain    Brief narrative: Christian [name] is a 59 y.o. male with PMH of HTN, HLD, low-grade bladder cancer status post TURBT in 2018, chronic pain, gout, obesity, alcohol abuse. Patient presented to the ED on 02/09/2020 with 4 days history of high-grade fever, chills, generalized body ache, weakness, chest pain. Not vaccinated against COVID-19. In the ED, patient had a fever of 103.1, tachycardia, tachypnea. COVID-19 PCR negative. WBC count elevated to 10.6, sodium 133, lactic acid normal CT chest showed multilobar pneumonia on the right side. Patient was admitted to hospitalist service. Patient is clinically improving but continues to have intermittent low-grade fever.  He has persistent crackles in the right lung.   Subjective: No acute issues or events overnight, patient feels markedly improved over the past 24 hours, remains without fever nausea vomiting diarrhea constipation headache or chills now for nearly 48 hours.  He does indicate ongoing cough and sputum production but markedly improved over similar timeframe.  Looking forward to increasing ambulation over the next day with hopes for possible discharge in the next 24 to 48 hours pending clinical course.  Assessment/Plan: Principal Problem:   Viral syndrome Active Problems:   HTN (hypertension)   Anxiety   Dyslipidemia   Obesity   Low back pain   Gout   SIRS (systemic inflammatory response syndrome) (HCC)   Acute hypoxic respiratory failure: POA Sepsis secondary to multilobar pneumonia Persistent fever -Community-acquired versus aspiration pneumonia likely related to alcoholism -Met sepsis criteria on admission with a fever of 102.1, tachycardia, tachypnea. -PCCM following, concern for aspiration pneumonia as well as sleep apnea -Speech evaluation  unremarkable, recommending esophagram which is certainly reasonable given his history, wife indicates patient does have episodes of coughing with p.o. intake -Discontinue IV cefepime and IV Flagyl -transition to Augmentin for coverage of likely aspiration pneumonia, discussed with PCCM -No growth in blood culture so far.  Sputum culture sent. -Patient on room air at rest today, will continue ambulatory oxygen screening in hopes to wean oxygen off prior to discharge Recent Labs  Lab 02/09/20 1954 02/10/20 0330 02/10/20 0840 02/11/20 0440 02/12/20 0059 02/13/20 0115 02/14/20 0153 02/15/20 0124 02/16/20 0159  WBC   < >  --   --   --  8.1 7.5 9.4 8.7 10.7*  LATICACIDVEN  --  1.4  --   --   --  0.9  --   --   --   PROCALCITON  --   --  2.70 2.19  --  0.95 0.63  --   --    < > = values in this interval not displayed.   Pleuritic chest pain, resolved -Troponin essentially flat. EKG no acute changes. -Echo with EF 60 to 65% and grade 1 diastolic dysfunction. -CT chest showed age advanced three-vessel coronary artery calcifications.  Recommend ischemia evaluation as an outpatient as indicated per cardiology.  Chronic alcohol abuse Hepatic steatosis -per CT scan -Drinks 3-4 beers every night -Monitor for signs of withdrawal -should be well outside the window for acute withdrawals at this point -Counseled about cessation. Recent Labs  Lab 02/10/20 0311  AST 32  ALT 46*  ALKPHOS 62  BILITOT 1.1  PROT 8.3*  ALBUMIN 3.8    Essential hypertension, stable -Home meds include Norvasc 5 mg daily, irbesartan 300 mg daily -  Currently controlled  Hyperlipidemia: -Continue statin, fenofibrate  Chronic gout: -Currently not in pain.  Low back pain/chronic pain:  Continue Norco as needed  Generalized anxiety disorder:  -Continue Klonopin as needed.  Patient may need more antianxiety medications as he tends to use more alcohol with uncontrolled anxiety  Obesity - Body mass index is  38.77 kg/m. Patient has been advised to make an attempt to improve diet and exercise patterns to aid in weight loss.  May have possible underlying sleep apnea.  Recommend sleep study as an outpatient  H/o low-grade bladder cancer status post TURBT in 2018.  Continue Flomax Chronic fatigue: On testosteronegel Right adrenal gland adenoma -stable per CT scan  Mobility: Encourage ambulation Code Status:   Code Status: Full Code  Nutritional status: Body mass index is 38.77 kg/m.     Diet Order            Diet heart healthy/carb modified Room service appropriate? Yes; Fluid consistency: Thin  Diet effective now                DVT prophylaxis: Lovenox subcu Antimicrobials:  IV ceftriaxone, IV Flagyl discontinued, continue Augmentin Fluid: Off IV fluid Consultants: None Family Communication: : Patient's wife at bedside  Status is: Inpatient  Remains inpatient appropriate because: Continues to be hypoxic from baseline with ongoing pneumonia  Dispo: The patient is from: Home              Anticipated d/c is to: Home              Anticipated d/c date is: 24 to 48 hours              Patient currently is not medically stable to d/c.  Due to ongoing hypoxia, need for further evaluation with esophagram to further risk stratify potential ongoing aspiration pneumonia versus pneumonitis as above.  Infusions:    Scheduled Meds: . amLODipine  5 mg Oral Daily  . amoxicillin-clavulanate  1 tablet Oral Q12H  . arformoterol  15 mcg Nebulization BID  . aspirin EC  81 mg Oral Daily  . atorvastatin  20 mg Oral Daily  . budesonide (PULMICORT) nebulizer solution  0.25 mg Nebulization BID  . enoxaparin (LOVENOX) injection  0.5 mg/kg Subcutaneous Q24H  . fenofibrate  54 mg Oral Daily  . insulin aspart  0-20 Units Subcutaneous TID WC  . irbesartan  300 mg Oral Daily  . loratadine  10 mg Oral Daily  . pantoprazole  40 mg Oral Daily  . revefenacin  175 mcg Nebulization Daily     Antimicrobials: Anti-infectives (From admission, onward)   Start     Dose/Rate Route Frequency Ordered Stop   02/16/20 1000  amoxicillin-clavulanate (AUGMENTIN) 875-125 MG per tablet 1 tablet        1 tablet Oral Every 12 hours 02/16/20 0726 02/20/20 0959   02/14/20 1530  cefTRIAXone (ROCEPHIN) 1 g in sodium chloride 0.9 % 100 mL IVPB  Status:  Discontinued        1 g 200 mL/hr over 30 Minutes Intravenous Every 24 hours 02/14/20 1456 02/16/20 0726   02/12/20 0930  metroNIDAZOLE (FLAGYL) IVPB 500 mg  Status:  Discontinued        500 mg 100 mL/hr over 60 Minutes Intravenous Every 8 hours 02/12/20 0928 02/16/20 0726   02/11/20 0200  vancomycin (VANCOCIN) IVPB 1000 mg/200 mL premix  Status:  Discontinued        1,000 mg 200 mL/hr over 60 Minutes Intravenous Every  12 hours 02/10/20 1208 02/13/20 1610   02/10/20 1230  vancomycin (VANCOREADY) IVPB 2000 mg/400 mL        2,000 mg 200 mL/hr over 120 Minutes Intravenous  Once 02/10/20 1208 02/10/20 1912   02/10/20 1230  ceFEPIme (MAXIPIME) 2 g in sodium chloride 0.9 % 100 mL IVPB  Status:  Discontinued        2 g 200 mL/hr over 30 Minutes Intravenous Every 8 hours 02/10/20 1208 02/14/20 1456      PRN meds: [DISCONTINUED] acetaminophen **OR** acetaminophen, albuterol, clonazePAM, HYDROcodone-acetaminophen, morphine injection, nitroGLYCERIN   Objective: Vitals:   02/16/20 0405 02/16/20 0425  BP: 135/73   Pulse: 73 76  Resp: (!) 28 20  Temp: 98.9 F (37.2 C)   SpO2: 94% 91%   No intake or output data in the 24 hours ending 02/16/20 0726 Filed Weights   02/10/20 0329  Weight: 115.7 kg   Weight change:  Body mass index is 38.77 kg/m.   Physical Exam: General:  Pleasantly resting in bed, No acute distress. HEENT:  Normocephalic atraumatic.  Sclerae nonicteric, noninjected.  Extraocular movements intact bilaterally. Neck:  Without mass or deformity.  Trachea is midline. Lungs: Scant bilateral coarse breath sounds bibasilarly  without overt rales or wheeze Heart:  Regular rate and rhythm.  Without murmurs, rubs, or gallops. Abdomen:  Soft, nontender, nondistended.  Without guarding or rebound. Extremities: Without cyanosis, clubbing, edema, or obvious deformity. Vascular:  Dorsalis pedis and posterior tibial pulses palpable bilaterally. Skin:  Warm and dry, no erythema, no ulcerations.   Data Review: I have personally reviewed the laboratory data and studies available.  Recent Labs  Lab 02/12/20 0059 02/13/20 0115 02/14/20 0153 02/15/20 0124 02/16/20 0159  WBC 8.1 7.5 9.4 8.7 10.7*  HGB 10.9* 10.3* 9.3* 10.1* 9.5*  HCT 33.5* 30.8* 28.0* 30.8* 30.4*  MCV 94.9 95.1 95.9 97.5 98.4  PLT 179 195 238 288 316   Recent Labs  Lab 02/09/20 1954 02/10/20 0840 02/12/20 0059 02/13/20 0115 02/14/20 0153 02/15/20 0124 02/16/20 0159  NA   < >  --  133* 132* 136 139 139  K   < >  --  3.8 3.9 3.6 3.3* 3.8  CL   < >  --  97* 97* 101 100 101  CO2   < >  --  $R'23 24 26 28 27  'qF$ GLUCOSE   < >  --  108* 118* 127* 154* 138*  BUN   < >  --  $R'18 13 12 8 11  'YR$ CREATININE   < >  --  0.94 0.80 0.86 0.75 0.76  CALCIUM   < >  --  8.9 8.6* 8.4* 9.1 9.0  MG  --  2.0  --   --   --   --   --   PHOS  --   --   --   --  2.1*  --   --    < > = values in this interval not displayed.     Signed, Christian Ishikawa, DO [facility] 02/16/2020

## 2020-02-16 NOTE — Progress Notes (Signed)
NAME:  Christian [name], MRN:  [id number], DOB:  [birth year], LOS: 6 ADMISSION DATE:  02/09/2020, CONSULTATION DATE:  02/16/20 REFERRING MD:  , CHIEF COMPLAINT:  Respiratory insufficiency  Brief History   59 y.o. M with PMH of asthma, HTN, HL, bladder Ca in remission and active ETOH abuse who was admitted 9/27 with PNA and sepsis.  He was initially treated with Vanc, Zosyn and Cefepime.  CXR with worsening RLL infiltrate, so PCCM consulted for possible bronch.  History of present illness   Christian [name] is a 59 y.o. M with PMH asthma, HTN, HL, bladder Ca in remission and active ETOH abuse who was admitted 9/27 with PNA and sepsis.   CXR initially with hazy opacities in bilateral bases.    He was initially treated broadly with Vanc, Cefepime and Flagyl then Vanc d/c'd when MRSA PCR negative.  He is coughing up white sputum  Last night pt had some de-saturations and required 2L [place], though he states that he has been told he needs a sleep study in the past.  Today, he is back on RA and states his breathing feels slightly heavier but is sitting up in no distress.  He denies any history of tobacco use, ill contacts or recent URI sx's.  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   Past Medical History   has a past medical history of Allergic rhinitis, Anxiety, Arthritis, Asthma, Back pain, Bladder cancer ([place]) (UROLOGIST-  DR MCKENZIE), Chest pain, Dyspnea, ED (erectile dysfunction), First degree heart block, GERD (gastroesophageal reflux disease), H/O concussion (MILD--- NO RESIDUAL), History of panic attacks, History of urinary retention, Hyperlipidemia, Hypertension, Joint pain, LAFB (left anterior fascicular block), Mild asthma, and RBBB (right bundle branch block).   Significant Hospital Events   9/27 Admit to [facility]  Consults:  PCCM  Procedures:    Significant Diagnostic Tests:  9/27 CT chest>>Polylobar pneumonia involving the right upper lobe and right lower lobe. Borderline  mediastinal and right hilar lymph nodes, likely reactive/inflammatory due to the right lung process. 9/30 CXR>>increased R infrahilar and basilar PNA  Micro Data:  9/27 Covid and Flu>>negative 9/27 Sputum>> 9/27 BCx2>> 10/1 Legionella and Strep Antigen  Antimicrobials:   Cefepime 9/27- Flagyl 9/27- Vancomycin 9/27-9/30  Interim history/subjective:  Patient reports feeling much better today. Did not have significant desaturation with ambulation today. He was started on brovana + budesonide + yupleri yesterday.  Objective   Blood pressure (!) 143/85, pulse 79, temperature 98 F (36.7 C), temperature source Oral, resp. rate (!) 25, height 5\' 8"  (1.727 m), weight 115.7 kg, SpO2 98 %.       No intake or output data in the 24 hours ending 02/16/20 1525 Filed Weights   02/10/20 0329  Weight: 115.7 kg   General:  Obese male, no acute distress, sitting up in chair HEENT: MM pink/moist Neuro: A&O x4 CV: s1s2 rrr, no m/r/g PULM:  Crackles throughout right lung fields. Increased air movement on right. No wheezing or rhonchi. Left with normal air movement. GI: soft, bsx4 active  Extremities: warm/dry, no edema  Skin: no rashes or lesions   Resolved Hospital Problem list     Assessment & Plan:   Multi-lobar PNA, history of asthma and ETOH abuse Most likely secondary to recurrent aspiration or reflux disease as he has ares of dilation of his esophagus and a thickened distal esophagus. He is also at risk for aspiration due to his alcohol use.  - transitioned to augmentin  for total of 7 day course  - Budesonide, brovana and yupelri while inpatient to maximize inhaler therapy for dyspnea and pulmonary hygiene. Can be discharged home on anoro ellipta or bevespi aerosphere - Continue flutter valve and incentive spirometry - He will require follow up CT chest scan in 3 months to monitor for complete resolution of opacities - Follow up appointment 11/1 at 10am scheduled  Reflux Disease -  Agree with Speech therapy, esophageal barium swallow for further evaluation of reflux disease. Can be done as outpatient if discharged today - Would also recommend evaluation via EGD as well for reflux disease and thickened distal esophagus  Underlying Sleep Apnea - Patient likely has underlying sleep apnea which are noted on the nocturnal desaturation   Labs   CBC: Recent Labs  Lab 02/12/20 0059 02/13/20 0115 02/14/20 0153 02/15/20 0124 02/16/20 0159  WBC 8.1 7.5 9.4 8.7 10.7*  HGB 10.9* 10.3* 9.3* 10.1* 9.5*  HCT 33.5* 30.8* 28.0* 30.8* 30.4*  MCV 94.9 95.1 95.9 97.5 98.4  PLT 179 195 238 288 294    Basic Metabolic Panel: Recent Labs  Lab 02/09/20 1954 02/10/20 0840 02/12/20 0059 02/13/20 0115 02/14/20 0153 02/15/20 0124 02/16/20 0159  NA   < >  --  133* 132* 136 139 139  K   < >  --  3.8 3.9 3.6 3.3* 3.8  CL   < >  --  97* 97* 101 100 101  CO2   < >  --  23 24 26 28 27   GLUCOSE   < >  --  108* 118* 127* 154* 138*  BUN   < >  --  18 13 12 8 11   CREATININE   < >  --  0.94 0.80 0.86 0.75 0.76  CALCIUM   < >  --  8.9 8.6* 8.4* 9.1 9.0  MG  --  2.0  --   --   --   --   --   PHOS  --   --   --   --  2.1*  --   --    < > = values in this interval not displayed.   GFR: Estimated Creatinine Clearance: 125.8 mL/min (by C-G formula based on SCr of 0.76 mg/dL). Recent Labs  Lab 02/10/20 0330 02/10/20 0840 02/11/20 0440 02/12/20 0059 02/13/20 0115 02/14/20 0153 02/15/20 0124 02/16/20 0159  PROCALCITON  --  2.70 2.19  --  0.95 0.63  --   --   WBC  --   --   --    < > 7.5 9.4 8.7 10.7*  LATICACIDVEN 1.4  --   --   --  0.9  --   --   --    < > = values in this interval not displayed.    Liver Function Tests: Recent Labs  Lab 02/10/20 0311  AST 32  ALT 46*  ALKPHOS 62  BILITOT 1.1  PROT 8.3*  ALBUMIN 3.8   Recent Labs  Lab 02/10/20 1200  LIPASE 24   No results for input(s): AMMONIA in the last 168 hours.  ABG    Component Value Date/Time   TCO2 26  03/17/2017 1042     Coagulation Profile: No results for input(s): INR, PROTIME in the last 168 hours.  Cardiac Enzymes: Recent Labs  Lab 02/10/20 0840  CKTOTAL 57    HbA1C: Hgb A1c MFr Bld  Date/Time Value Ref Range Status  02/10/2020 08:40 AM 5.4 4.8 - 5.6 % Final  Comment:    (NOTE) Pre diabetes:          5.7%-6.4%  Diabetes:              >6.4%  Glycemic control for   <7.0% adults with diabetes   10/29/2019 08:34 AM 6.4 4.6 - 6.5 % Final    Comment:    Glycemic Control Guidelines for People with Diabetes:Non Diabetic:  <6%Goal of Therapy: <7%Additional Action Suggested:  >8%     CBG: Recent Labs  Lab 02/15/20 1237 02/15/20 1649 02/15/20 2104 02/16/20 0732 02/16/20 1218  GLUCAP 197* 154* 132* 118* 116*   Freda Jackson, MD [facility] Office: [phone]   See Amion for Pager Details

## 2020-02-16 NOTE — Progress Notes (Signed)
Patient Saturations on Room Air at Rest = 99%  Patient Saturations on [employer] while Ambulating : 91-94  Pt was walking with wife, he maintained O2 stats over 34. He walked for about 10 minutes. One time his O2 was 89, but he got back up to the 91, on his own.

## 2020-02-17 DIAGNOSIS — B349 Viral infection, unspecified: Secondary | ICD-10-CM | POA: Diagnosis not present

## 2020-02-17 LAB — GLUCOSE, CAPILLARY
Glucose-Capillary: 125 mg/dL — ABNORMAL HIGH (ref 70–99)
Glucose-Capillary: 163 mg/dL — ABNORMAL HIGH (ref 70–99)

## 2020-02-17 MED ORDER — ANORO ELLIPTA 62.5-25 MCG/INH IN AEPB: 1.0000 | INHALATION_SPRAY | Freq: Every day | RESPIRATORY_TRACT | 0 refills | Status: AC

## 2020-02-17 NOTE — Discharge Summary (Addendum)
hysician Discharge Summary  ANUEL SITTER ACZ:[id number] DOB: May 27, 1960 DOA: 02/09/2020  PCP: Cassandria Anger, MD  Admit date: 02/09/2020 Discharge date: 02/17/2020  Admitted From: Home Disposition: Home  Recommendations for Outpatient Follow-up:  1. Follow up with PCP in 1-2 weeks 2. Follow-up with pulmonology for further evaluation and testing with sleep study scheduled per their expertise  Home Health: None Equipment/Devices: None  Discharge Condition: Stable CODE STATUS: Full Diet recommendation: Low-carb low-salt diet  Brief/Interim Summary: Christian [name] is a 59 y.o. male with PMH of HTN, HLD, low-grade bladder cancer status post TURBT in 2018, chronic pain, gout, obesity, alcohol abuse. Patient presented to the ED on 02/09/2020 with 4 days history of high-grade fever, chills, generalized body ache, weakness, chest pain. Not vaccinated against COVID-19. In the ED, patient had a fever of 103.1, tachycardia, tachypnea. COVID-19 PCR negative. WBC count elevated to 10.6, sodium 133, lactic acid normal CT chest showed multilobar pneumonia on the right side. Patient was admitted to hospitalist service. Patient is clinically improving but continues to have intermittent low-grade fever.  He has persistent crackles in the right lung.   Patient admitted as above with acute hypoxic respiratory failure in the setting of multilobar pneumonia, sepsis as well as likely concurrent asthma exacerbation.  Patient completed antibiotic course in the hospital, markedly improving at this point, there is concern for aspiration pneumonia and pneumonitis given history of alcohol abuse and dilated esophagus noted on CT scan.  Patient plan for esophagram with outpatient follow-up with PCP.  Patient has been evaluated by pulmonology here, recommended to place patient on Anoro at discharge with close follow-up for sleep study given his transient hypoxia overnight.  Patient otherwise at this time stable and  agreeable for discharge home.   Discharge Instructions  Discharge Instructions    Call MD for:  difficulty breathing, headache or visual disturbances   Complete by: As directed    Call MD for:  hives   Complete by: As directed    Call MD for:  persistant dizziness or light-headedness   Complete by: As directed    Call MD for:  temperature >100.4   Complete by: As directed    Diet - low sodium heart healthy   Complete by: As directed    Diet Carb Modified   Complete by: As directed    Increase activity slowly   Complete by: As directed      Allergies as of 02/17/2020      Reactions   Lisinopril Cough   Cough   Lexapro [escitalopram Oxalate] Other (See Comments)   tired   Lipitor [atorvastatin] Other (See Comments)   myalgias      Medication List    STOP taking these medications   indomethacin 50 MG capsule Commonly known as: INDOCIN     TAKE these medications   acetaminophen 500 MG tablet Commonly known as: TYLENOL Take 1,000 mg by mouth daily.   amLODipine 5 MG tablet Commonly known as: NORVASC Take 1 tablet (5 mg total) by mouth daily.   Anoro Ellipta 62.5-25 MCG/INH Aepb Generic drug: umeclidinium-vilanterol Inhale 1 puff into the lungs daily.   ascorbic acid 500 MG tablet Commonly known as: VITAMIN C Take 500 mg by mouth daily.   atorvastatin 20 MG tablet Commonly known as: LIPITOR TAKE 1 TABLET BY MOUTH EVERY DAY   b complex vitamins capsule Take 1 capsule by mouth once a week.   clonazePAM 1 MG tablet Commonly known as: KLONOPIN TAKE 1 TABLET  BY MOUTH TWICE DAILY AS NEEDED FOR  ANXIETY What changed:   how much to take  how to take this  when to take this  reasons to take this   Colchicine 0.6 MG Caps 1 po qd prn gout attack What changed:   how much to take  how to take this  when to take this  reasons to take this   fenofibrate 145 MG tablet Commonly known as: TRICOR TAKE 1 TABLET BY MOUTH EVERY DAY    HYDROcodone-acetaminophen 7.5-325 MG tablet Commonly known as: NORCO Take 0.5-1 tablets by mouth every 6 (six) hours as needed for moderate pain or severe pain.   irbesartan 300 MG tablet Commonly known as: AVAPRO TAKE 1 TABLET BY MOUTH EVERY DAY   tamsulosin 0.4 MG Caps capsule Commonly known as: FLOMAX Take 1 capsule (0.4 mg total) by mouth daily after supper.   testosterone 50 MG/5GM (1%) Gel Commonly known as: Testim Place 5 g onto the skin daily.   Vitamin D3 50 MCG (2000 UT) capsule Take 1 capsule (2,000 Units total) by mouth daily.   zinc gluconate 50 MG tablet Take 50 mg by mouth daily.       Follow-up Information    Freddi Starr, MD. Go on 03/16/2021.   Specialty: Pulmonary Disease Why: Please go to your follow up pulmonary appointment 11/1 at 11:00am, please arrive to your appointment 15 minutes  Contact information: [address] [place] [address] [phone]              Allergies  Allergen Reactions  . Lisinopril Cough    Cough   . Lexapro [Escitalopram Oxalate] Other (See Comments)    tired  . Lipitor [Atorvastatin] Other (See Comments)    myalgias    Consultations:  Pulmonology, Dr. Erin Fulling   Procedures/Studies: CT CHEST ABDOMEN PELVIS W CONTRAST  Result Date: 02/10/2020 CLINICAL DATA:  Cough, fever and chills 4 days. Generalized body aches. EXAM: CT CHEST, ABDOMEN, AND PELVIS WITH CONTRAST TECHNIQUE: Multidetector CT imaging of the chest, abdomen and pelvis was performed following the standard protocol during bolus administration of intravenous contrast. CONTRAST:  158mL OMNIPAQUE IOHEXOL 300 MG/ML  SOLN COMPARISON:  Chest x-ray 02/09/2020 FINDINGS: CT CHEST FINDINGS Cardiovascular: The heart is normal in size. No pericardial effusion. The aorta is normal in caliber. Scattered atherosclerotic calcifications. No dissection. The branch vessels are patent. Age advanced three-vessel coronary artery calcifications are noted.  Mediastinum/Nodes: Borderline mediastinal and right hilar lymph nodes likely reactive/inflammatory due to the right lung process. Mild distal esophageal wall thickening likely esophagitis. Lungs/Pleura: Extensive airspace consolidation/pneumonia involving the posterior aspect of the right upper lobe and the medial aspect of the entire right lower lobe. Findings consistent with poly lobar pneumonia. The left lung is clear. No pleural effusions. No worrisome pulmonary lesions. Musculoskeletal: No significant bony findings. CT ABDOMEN PELVIS FINDINGS Hepatobiliary: No hepatic lesions or intrahepatic biliary dilatation. Mild diffuse fatty infiltration of the liver is noted. The gallbladder is unremarkable. No intra or extrahepatic biliary dilatation. Pancreas: No mass, inflammation or ductal dilatation. Spleen: Mild stable splenomegaly. The spleen measures 15.5 x 13.0 x 11.0 cm. No focal lesions. Adrenals/Urinary Tract: 3.4 x 2.8 cm right adrenal gland lesion unchanged since 2017 and consistent with benign adenoma. The left adrenal gland is normal. The kidneys are unremarkable and stable. The bladder is unremarkable. Stomach/Bowel: Stomach the stomach, duodenum, small bowel and colon are grossly normal without oral contrast. No acute inflammatory changes, mass lesions or obstructive  findings. The terminal ileum is normal. The appendix is normal. Vascular/Lymphatic: Slightly progressive aortic calcifications. No aneurysm or dissection. The branch vessels are patent. The major venous structures are patent. No mesenteric or retroperitoneal mass or adenopathy. Reproductive: The prostate gland and seminal vesicles are unremarkable. Other: No pelvic mass or adenopathy. No free pelvic fluid collections. No inguinal mass or adenopathy. No abdominal wall hernia or subcutaneous lesions. Musculoskeletal: No significant bony findings. IMPRESSION: 1. Polylobar pneumonia involving the right upper lobe and right lower lobe. 2.  Borderline mediastinal and right hilar lymph nodes, likely reactive/inflammatory due to the right lung process. 3. Age advanced three-vessel coronary artery calcifications. 4. Stable right adrenal gland adenoma. 5. Stable mild splenomegaly. 6. Mild diffuse fatty infiltration of the liver. 7. Aortic atherosclerosis. Aortic Atherosclerosis (ICD10-I70.0). Electronically Signed   By: Marijo Sanes M.D.   On: 02/10/2020 16:08   DG CHEST PORT 1 VIEW  Result Date: 02/13/2020 CLINICAL DATA:  Dyspnea. EXAM: PORTABLE CHEST 1 VIEW COMPARISON:  February 09, 2020. FINDINGS: Stable cardiomegaly. No pneumothorax or pleural effusion is noted. Increased right infrahilar and basilar opacity is noted concerning for pneumonia. Left lung is clear. Bony thorax is unremarkable. IMPRESSION: Increased right infrahilar and basilar opacity is noted concerning for pneumonia. Electronically Signed   By: Marijo Conception M.D.   On: 02/13/2020 17:14   DG Chest Port 1 View  Result Date: 02/09/2020 CLINICAL DATA:  Central chest pain for 2 days, shortness of breath EXAM: PORTABLE CHEST 1 VIEW COMPARISON:  Coronary CT 03/25/2019, radiograph 11/30/2011 FINDINGS: Mild central vascular congestion with some hazy opacity towards the lung bases. Question some peripheral septal lines as well. Cardiac silhouette appears enlarged from prior portable radiography though could be accentuated by some low lung volumes. The aorta is calcified. The remaining cardiomediastinal contours are unremarkable. No pneumothorax or effusion. No acute osseous or soft tissue abnormality. Degenerative changes are present in the imaged spine and shoulders. IMPRESSION: 1. Mild central vascular congestion with some hazy opacity towards the lung bases, suspicious for mild interstitial edema though atypical infection could present similarly given the setting of fever. Atelectatic changes likely present as well. 2. Cardiac silhouette appears enlarged from prior portable  radiography though could be accentuated by some low lung volumes. Electronically Signed   By: Lovena Le M.D.   On: 02/09/2020 20:39   ECHOCARDIOGRAM COMPLETE  Result Date: 02/11/2020    ECHOCARDIOGRAM REPORT   Patient Name:   Tyrick LAMONE FERRELLI Date of Exam: 02/11/2020 Medical Rec #:  [id number]       Height:       68.0 in Accession #:    [id number]      Weight:       255.0 lb Date of Birth:  1961-02-09       BSA:          2.266 m Patient Age:    12 years        BP:           140/78 mmHg Patient Gender: M               HR:           85 bpm. Exam Location:  Inpatient Procedure: 2D Echo, Cardiac Doppler, Color Doppler and Intracardiac            Opacification Agent Indications:    Dyspnea 786.09 / R06.00  History:        Patient has prior history of Echocardiogram examinations, most  recent 03/15/2012. Arrythmias:RBBB, Signs/Symptoms:Chest Pain;                 Risk Factors:Hypertension, Dyslipidemia and GERD.  Sonographer:    Jonelle Sidle Dance Referring Phys: [id number] Talin GLICK IMPRESSIONS  1. Left ventricular ejection fraction, by estimation, is 60 to 65%. The left ventricle has normal function. The left ventricle has no regional wall motion abnormalities. There is mild left ventricular hypertrophy. Left ventricular diastolic parameters are consistent with Grade I diastolic dysfunction (impaired relaxation). Elevated left ventricular end-diastolic pressure.  2. Right ventricular systolic function is normal. The right ventricular size is normal. Tricuspid regurgitation signal is inadequate for assessing PA pressure.  3. The mitral valve is abnormal. Trivial mitral valve regurgitation.  4. The aortic valve is tricuspid. Aortic valve regurgitation is not visualized.  5. The inferior vena cava is normal in size with <50% respiratory variability, suggesting right atrial pressure of 8 mmHg. FINDINGS  Left Ventricle: Left ventricular ejection fraction, by estimation, is 60 to 65%. The left ventricle has normal  function. The left ventricle has no regional wall motion abnormalities. The left ventricular internal cavity size was normal in size. There is  mild left ventricular hypertrophy. Left ventricular diastolic parameters are consistent with Grade I diastolic dysfunction (impaired relaxation). Elevated left ventricular end-diastolic pressure. Right Ventricle: The right ventricular size is normal. No increase in right ventricular wall thickness. Right ventricular systolic function is normal. Tricuspid regurgitation signal is inadequate for assessing PA pressure. Left Atrium: Left atrial size was normal in size. Right Atrium: Right atrial size was normal in size. Pericardium: There is no evidence of pericardial effusion. Mitral Valve: The mitral valve is abnormal. Mild to moderate mitral annular calcification. Trivial mitral valve regurgitation. Tricuspid Valve: The tricuspid valve is grossly normal. Tricuspid valve regurgitation is not demonstrated. Aortic Valve: The aortic valve is tricuspid. Aortic valve regurgitation is not visualized. Pulmonic Valve: The pulmonic valve was normal in structure. Pulmonic valve regurgitation is not visualized. Aorta: The aortic root and ascending aorta are structurally normal, with no evidence of dilitation. Venous: The inferior vena cava is normal in size with less than 50% respiratory variability, suggesting right atrial pressure of 8 mmHg. IAS/Shunts: No atrial level shunt detected by color flow Doppler.  LEFT VENTRICLE PLAX 2D LVIDd:         5.00 cm  Diastology LVIDs:         3.70 cm  LV e' medial:    5.55 cm/s LV PW:         1.60 cm  LV E/e' medial:  16.2 LV IVS:        1.10 cm  LV e' lateral:   8.92 cm/s LVOT diam:     2.00 cm  LV E/e' lateral: 10.1 LV SV:         79 LV SV Index:   35 LVOT Area:     3.14 cm  RIGHT VENTRICLE             IVC RV Basal diam:  2.90 cm     IVC diam: 2.00 cm RV S prime:     12.90 cm/s TAPSE (M-mode): 2.2 cm LEFT ATRIUM             Index       RIGHT  ATRIUM           Index LA diam:        4.00 cm 1.77 cm/m  RA Area:     14.30 cm LA Vol (  A2C):   45.7 ml 20.17 ml/m RA Volume:   32.30 ml  14.25 ml/m LA Vol (A4C):   34.6 ml 15.27 ml/m LA Biplane Vol: 39.6 ml 17.48 ml/m  AORTIC VALVE LVOT Vmax:   139.00 cm/s LVOT Vmean:  92.700 cm/s LVOT VTI:    0.250 m  AORTA Ao Root diam: 3.40 cm Ao Asc diam:  3.50 cm MITRAL VALVE MV Area (PHT): 2.73 cm    SHUNTS MV Decel Time: 278 msec    Systemic VTI:  0.25 m MV E velocity: 89.70 cm/s  Systemic Diam: 2.00 cm MV A velocity: 71.10 cm/s MV E/A ratio:  1.26 [place] Bishop MD Electronically signed by [place] Bishop MD Signature Date/Time: 02/11/2020/3:20:15 PM    Final      Subjective: No acute issues or events overnight, feels quite well back to baseline requesting discharge home which is certainly reasonable given resolution of symptoms remains afebrile and no longer hypoxic.   Discharge Exam: Vitals:   02/17/20 0405 02/17/20 0739  BP: (!) 153/89 (!) 144/71  Pulse: 77 79  Resp: 20 20  Temp: 98.2 F (36.8 C) 99.1 F (37.3 C)  SpO2: 93% 94%   Vitals:   02/16/20 2003 02/16/20 2304 02/17/20 0405 02/17/20 0739  BP: (!) 141/94 (!) 143/84 (!) 153/89 (!) 144/71  Pulse: 77 78 77 79  Resp: 20 20 20 20   Temp: 98.1 F (36.7 C) 98 F (36.7 C) 98.2 F (36.8 C) 99.1 F (37.3 C)  TempSrc: Oral Oral Oral Oral  SpO2: 96% 96% 93% 94%  Weight:      Height:        General: Pt is alert, awake, not in acute distress Cardiovascular: RRR, S1/S2 +, no rubs, no gallops Respiratory: CTA bilaterally, no wheezing, no rhonchi Abdominal: Soft, NT, ND, bowel sounds + Extremities: no edema, no cyanosis    The results of significant diagnostics from this hospitalization (including imaging, microbiology, ancillary and laboratory) are listed below for reference.     Microbiology: Recent Results (from the past 240 hour(s))  Respiratory Panel by RT PCR (Flu A&B, Covid) - Nasopharyngeal Swab     Status: None   Collection  Time: 02/09/20  7:37 PM   Specimen: Nasopharyngeal Swab  Result Value Ref Range Status   SARS Coronavirus 2 by RT PCR NEGATIVE NEGATIVE Final    Comment: (NOTE) SARS-CoV-2 target nucleic acids are NOT DETECTED.  The SARS-CoV-2 RNA is generally detectable in upper respiratoy specimens during the acute phase of infection. The lowest concentration of SARS-CoV-2 viral copies this assay can detect is 131 copies/mL. A negative result does not preclude SARS-Cov-2 infection and should not be used as the sole basis for treatment or other patient management decisions. A negative result may occur with  improper specimen collection/handling, submission of specimen other than nasopharyngeal swab, presence of viral mutation(s) within the areas targeted by this assay, and inadequate number of viral copies (<131 copies/mL). A negative result must be combined with clinical observations, patient history, and epidemiological information. The expected result is Negative.  Fact Sheet for Patients:  PinkCheek.be  Fact Sheet for Healthcare Providers:  GravelBags.it  This test is no t yet approved or cleared by the [place] FDA and  has been authorized for detection and/or diagnosis of SARS-CoV-2 by FDA under an Emergency Use Authorization (EUA). This EUA will remain  in effect (meaning this test can be used) for the duration of the COVID-19 declaration under Section 564(b)(1) of the Act, 21 U.S.C. section  360bbb-3(b)(1), unless the authorization is terminated or revoked sooner.     Influenza A by PCR NEGATIVE NEGATIVE Final   Influenza B by PCR NEGATIVE NEGATIVE Final    Comment: (NOTE) The Xpert Xpress SARS-CoV-2/FLU/RSV assay is intended as an aid in  the diagnosis of influenza from Nasopharyngeal swab specimens and  should not be used as a sole basis for treatment. Nasal washings and  aspirates are unacceptable for Xpert Xpress  SARS-CoV-2/FLU/RSV  testing.  Fact Sheet for Patients: PinkCheek.be  Fact Sheet for Healthcare Providers: GravelBags.it  This test is not yet approved or cleared by the [place] FDA and  has been authorized for detection and/or diagnosis of SARS-CoV-2 by  FDA under an Emergency Use Authorization (EUA). This EUA will remain  in effect (meaning this test can be used) for the duration of the  Covid-19 declaration under Section 564(b)(1) of the Act, 21  U.S.C. section 360bbb-3(b)(1), unless the authorization is  terminated or revoked. Performed at [facility], [place] [address] [place], [place] [address]   Culture, blood (routine x 2)     Status: None   Collection Time: 02/10/20  3:26 AM   Specimen: BLOOD LEFT HAND  Result Value Ref Range Status   Specimen Description BLOOD LEFT HAND  Final   Special Requests   Final    BOTTLES DRAWN AEROBIC AND ANAEROBIC Blood Culture results may not be optimal due to an inadequate volume of blood received in culture bottles   Culture   Final    NO GROWTH 5 DAYS Performed at [facility], [place] [address] [place], [place] [address]    Report Status 02/15/2020 FINAL  Final  Culture, blood (routine x 2)     Status: None   Collection Time: 02/10/20  3:30 AM   Specimen: BLOOD  Result Value Ref Range Status   Specimen Description BLOOD RIGHT ANTECUBITAL  Final   Special Requests   Final    BOTTLES DRAWN AEROBIC AND ANAEROBIC Blood Culture adequate volume   Culture   Final    NO GROWTH 5 DAYS Performed at [facility], [place] [address] [place], [place] [address]    Report Status 02/15/2020 FINAL  Final  Respiratory Panel by RT PCR (Flu A&B, Covid) - Nasopharyngeal Swab     Status: None   Collection Time: 02/10/20 11:25 AM   Specimen: Nasopharyngeal Swab  Result Value Ref Range Status   SARS Coronavirus 2 by RT PCR NEGATIVE NEGATIVE Final    Comment: (NOTE) SARS-CoV-2  target nucleic acids are NOT DETECTED.  The SARS-CoV-2 RNA is generally detectable in upper respiratoy specimens during the acute phase of infection. The lowest concentration of SARS-CoV-2 viral copies this assay can detect is 131 copies/mL. A negative result does not preclude SARS-Cov-2 infection and should not be used as the sole basis for treatment or other patient management decisions. A negative result may occur with  improper specimen collection/handling, submission of specimen other than nasopharyngeal swab, presence of viral mutation(s) within the areas targeted by this assay, and inadequate number of viral copies (<131 copies/mL). A negative result must be combined with clinical observations, patient history, and epidemiological information. The expected result is Negative.  Fact Sheet for Patients:  PinkCheek.be  Fact Sheet for Healthcare Providers:  GravelBags.it  This test is no t yet approved or cleared by the [place] FDA and  has been authorized for detection and/or diagnosis of SARS-CoV-2 by FDA under an Emergency Use  Authorization (EUA). This EUA will remain  in effect (meaning this test can be used) for the duration of the COVID-19 declaration under Section 564(b)(1) of the Act, 21 U.S.C. section 360bbb-3(b)(1), unless the authorization is terminated or revoked sooner.     Influenza A by PCR NEGATIVE NEGATIVE Final   Influenza B by PCR NEGATIVE NEGATIVE Final    Comment: (NOTE) The Xpert Xpress SARS-CoV-2/FLU/RSV assay is intended as an aid in  the diagnosis of influenza from Nasopharyngeal swab specimens and  should not be used as a sole basis for treatment. Nasal washings and  aspirates are unacceptable for Xpert Xpress SARS-CoV-2/FLU/RSV  testing.  Fact Sheet for Patients: PinkCheek.be  Fact Sheet for Healthcare  Providers: GravelBags.it  This test is not yet approved or cleared by the [place] FDA and  has been authorized for detection and/or diagnosis of SARS-CoV-2 by  FDA under an Emergency Use Authorization (EUA). This EUA will remain  in effect (meaning this test can be used) for the duration of the  Covid-19 declaration under Section 564(b)(1) of the Act, 21  U.S.C. section 360bbb-3(b)(1), unless the authorization is  terminated or revoked. Performed at [facility], [place] [address] [place], [place] [address]   MRSA PCR Screening     Status: None   Collection Time: 02/13/20  2:07 PM   Specimen: Nasal Mucosa; Nasopharyngeal  Result Value Ref Range Status   MRSA by PCR NEGATIVE NEGATIVE Final    Comment:        The GeneXpert MRSA Assay (FDA approved for NASAL specimens only), is one component of a comprehensive MRSA colonization surveillance program. It is not intended to diagnose MRSA infection nor to guide or monitor treatment for MRSA infections. Performed at [facility], [place] [address] [place], [place] [address]      Labs: BNP (last 3 results) Recent Labs    02/10/20 0840  BNP 10.6   Basic Metabolic Panel: Recent Labs  Lab 02/12/20 0059 02/13/20 0115 02/14/20 0153 02/15/20 0124 02/16/20 0159  NA 133* 132* 136 139 139  K 3.8 3.9 3.6 3.3* 3.8  CL 97* 97* 101 100 101  CO2 23 24 26 28 27   GLUCOSE 108* 118* 127* 154* 138*  BUN 18 13 12 8 11   CREATININE 0.94 0.80 0.86 0.75 0.76  CALCIUM 8.9 8.6* 8.4* 9.1 9.0  PHOS  --   --  2.1*  --   --    Liver Function Tests: No results for input(s): AST, ALT, ALKPHOS, BILITOT, PROT, ALBUMIN in the last 168 hours. Recent Labs  Lab 02/10/20 1200  LIPASE 24   No results for input(s): AMMONIA in the last 168 hours. CBC: Recent Labs  Lab 02/12/20 0059 02/13/20 0115 02/14/20 0153 02/15/20 0124 02/16/20 0159  WBC 8.1 7.5 9.4 8.7 10.7*  HGB 10.9* 10.3* 9.3* 10.1* 9.5*  HCT  33.5* 30.8* 28.0* 30.8* 30.4*  MCV 94.9 95.1 95.9 97.5 98.4  PLT 179 195 238 288 316   Cardiac Enzymes: No results for input(s): CKTOTAL, CKMB, CKMBINDEX, TROPONINI in the last 168 hours. BNP: Invalid input(s): POCBNP CBG: Recent Labs  Lab 02/16/20 0732 02/16/20 1218 02/16/20 1636 02/16/20 2057 02/17/20 0739  GLUCAP 118* 116* 148* 132* 125*   D-Dimer No results for input(s): DDIMER in the last 72 hours. Hgb A1c No results for input(s): HGBA1C in the last 72 hours. Lipid Profile No results for input(s): CHOL, HDL, LDLCALC, TRIG, CHOLHDL, LDLDIRECT in the last 72 hours. Thyroid function  studies No results for input(s): TSH, T4TOTAL, T3FREE, THYROIDAB in the last 72 hours.  Invalid input(s): FREET3 Anemia work up No results for input(s): VITAMINB12, FOLATE, FERRITIN, TIBC, IRON, RETICCTPCT in the last 72 hours. Urinalysis    Component Value Date/Time   COLORURINE YELLOW 02/12/2020 1644   APPEARANCEUR CLEAR 02/12/2020 1644   LABSPEC 1.023 02/12/2020 1644   PHURINE 5.0 02/12/2020 1644   GLUCOSEU 50 (A) 02/12/2020 1644   GLUCOSEU NEGATIVE 10/29/2019 0922   HGBUR SMALL (A) 02/12/2020 1644   BILIRUBINUR NEGATIVE 02/12/2020 1644   KETONESUR NEGATIVE 02/12/2020 1644   PROTEINUR 30 (A) 02/12/2020 1644   UROBILINOGEN 0.2 10/29/2019 0922   NITRITE NEGATIVE 02/12/2020 1644   LEUKOCYTESUR NEGATIVE 02/12/2020 1644   Sepsis Labs Invalid input(s): PROCALCITONIN,  WBC,  LACTICIDVEN Microbiology Recent Results (from the past 240 hour(s))  Respiratory Panel by RT PCR (Flu A&B, Covid) - Nasopharyngeal Swab     Status: None   Collection Time: 02/09/20  7:37 PM   Specimen: Nasopharyngeal Swab  Result Value Ref Range Status   SARS Coronavirus 2 by RT PCR NEGATIVE NEGATIVE Final    Comment: (NOTE) SARS-CoV-2 target nucleic acids are NOT DETECTED.  The SARS-CoV-2 RNA is generally detectable in upper respiratoy specimens during the acute phase of infection. The lowest concentration  of SARS-CoV-2 viral copies this assay can detect is 131 copies/mL. A negative result does not preclude SARS-Cov-2 infection and should not be used as the sole basis for treatment or other patient management decisions. A negative result may occur with  improper specimen collection/handling, submission of specimen other than nasopharyngeal swab, presence of viral mutation(s) within the areas targeted by this assay, and inadequate number of viral copies (<131 copies/mL). A negative result must be combined with clinical observations, patient history, and epidemiological information. The expected result is Negative.  Fact Sheet for Patients:  PinkCheek.be  Fact Sheet for Healthcare Providers:  GravelBags.it  This test is no t yet approved or cleared by the [place] FDA and  has been authorized for detection and/or diagnosis of SARS-CoV-2 by FDA under an Emergency Use Authorization (EUA). This EUA will remain  in effect (meaning this test can be used) for the duration of the COVID-19 declaration under Section 564(b)(1) of the Act, 21 U.S.C. section 360bbb-3(b)(1), unless the authorization is terminated or revoked sooner.     Influenza A by PCR NEGATIVE NEGATIVE Final   Influenza B by PCR NEGATIVE NEGATIVE Final    Comment: (NOTE) The Xpert Xpress SARS-CoV-2/FLU/RSV assay is intended as an aid in  the diagnosis of influenza from Nasopharyngeal swab specimens and  should not be used as a sole basis for treatment. Nasal washings and  aspirates are unacceptable for Xpert Xpress SARS-CoV-2/FLU/RSV  testing.  Fact Sheet for Patients: PinkCheek.be  Fact Sheet for Healthcare Providers: GravelBags.it  This test is not yet approved or cleared by the [place] FDA and  has been authorized for detection and/or diagnosis of SARS-CoV-2 by  FDA under an Emergency Use  Authorization (EUA). This EUA will remain  in effect (meaning this test can be used) for the duration of the  Covid-19 declaration under Section 564(b)(1) of the Act, 21  U.S.C. section 360bbb-3(b)(1), unless the authorization is  terminated or revoked. Performed at [facility], [place] [address] [place], [place] [address]   Culture, blood (routine x 2)     Status: None   Collection Time: 02/10/20  3:26 AM   Specimen: BLOOD LEFT HAND  Result Value Ref Range Status   Specimen Description BLOOD LEFT HAND  Final   Special Requests   Final    BOTTLES DRAWN AEROBIC AND ANAEROBIC Blood Culture results may not be optimal due to an inadequate volume of blood received in culture bottles   Culture   Final    NO GROWTH 5 DAYS Performed at [facility] [address], [place], [place] [address]    Report Status 02/15/2020 FINAL  Final  Culture, blood (routine x 2)     Status: None   Collection Time: 02/10/20  3:30 AM   Specimen: BLOOD  Result Value Ref Range Status   Specimen Description BLOOD RIGHT ANTECUBITAL  Final   Special Requests   Final    BOTTLES DRAWN AEROBIC AND ANAEROBIC Blood Culture adequate volume   Culture   Final    NO GROWTH 5 DAYS Performed at [facility], [place] [address] [place], [place] [address]    Report Status 02/15/2020 FINAL  Final  Respiratory Panel by RT PCR (Flu A&B, Covid) - Nasopharyngeal Swab     Status: None   Collection Time: 02/10/20 11:25 AM   Specimen: Nasopharyngeal Swab  Result Value Ref Range Status   SARS Coronavirus 2 by RT PCR NEGATIVE NEGATIVE Final    Comment: (NOTE) SARS-CoV-2 target nucleic acids are NOT DETECTED.  The SARS-CoV-2 RNA is generally detectable in upper respiratoy specimens during the acute phase of infection. The lowest concentration of SARS-CoV-2 viral copies this assay can detect is 131 copies/mL. A negative result does not preclude SARS-Cov-2 infection and should not be used as the sole basis for  treatment or other patient management decisions. A negative result may occur with  improper specimen collection/handling, submission of specimen other than nasopharyngeal swab, presence of viral mutation(s) within the areas targeted by this assay, and inadequate number of viral copies (<131 copies/mL). A negative result must be combined with clinical observations, patient history, and epidemiological information. The expected result is Negative.  Fact Sheet for Patients:  PinkCheek.be  Fact Sheet for Healthcare Providers:  GravelBags.it  This test is no t yet approved or cleared by the [place] FDA and  has been authorized for detection and/or diagnosis of SARS-CoV-2 by FDA under an Emergency Use Authorization (EUA). This EUA will remain  in effect (meaning this test can be used) for the duration of the COVID-19 declaration under Section 564(b)(1) of the Act, 21 U.S.C. section 360bbb-3(b)(1), unless the authorization is terminated or revoked sooner.     Influenza A by PCR NEGATIVE NEGATIVE Final   Influenza B by PCR NEGATIVE NEGATIVE Final    Comment: (NOTE) The Xpert Xpress SARS-CoV-2/FLU/RSV assay is intended as an aid in  the diagnosis of influenza from Nasopharyngeal swab specimens and  should not be used as a sole basis for treatment. Nasal washings and  aspirates are unacceptable for Xpert Xpress SARS-CoV-2/FLU/RSV  testing.  Fact Sheet for Patients: PinkCheek.be  Fact Sheet for Healthcare Providers: GravelBags.it  This test is not yet approved or cleared by the [place] FDA and  has been authorized for detection and/or diagnosis of SARS-CoV-2 by  FDA under an Emergency Use Authorization (EUA). This EUA will remain  in effect (meaning this test can be used) for the duration of the  Covid-19 declaration under Section 564(b)(1) of the Act, 21   U.S.C. section 360bbb-3(b)(1), unless the authorization is  terminated or revoked. Performed at [facility], [place] [address]  [place], [place] [address]   MRSA PCR Screening     Status: None   Collection Time: 02/13/20  2:07 PM   Specimen: Nasal Mucosa; Nasopharyngeal  Result Value Ref Range Status   MRSA by PCR NEGATIVE NEGATIVE Final    Comment:        The GeneXpert MRSA Assay (FDA approved for NASAL specimens only), is one component of a comprehensive MRSA colonization surveillance program. It is not intended to diagnose MRSA infection nor to guide or monitor treatment for MRSA infections. Performed at [facility], [place] [address] [place], [place] [address]      Time coordinating discharge: Over 30 minutes  SIGNED:   Little Ishikawa, DO [facility] 02/17/2020, 11:36 AM Pager   If 7PM-7AM, please contact night-coverage [email or web address]

## 2020-02-21 ENCOUNTER — Ambulatory Visit: Payer: 59 | Admitting: Internal Medicine

## 2020-02-21 ENCOUNTER — Ambulatory Visit: Payer: 59 | Admitting: Urology

## 2020-03-11 ENCOUNTER — Encounter: Payer: Self-pay | Admitting: Internal Medicine

## 2020-03-11 ENCOUNTER — Ambulatory Visit: Payer: 59 | Admitting: Internal Medicine

## 2020-03-11 ENCOUNTER — Other Ambulatory Visit: Payer: Self-pay

## 2020-03-11 VITALS — BP 130/92 | HR 70 | Temp 98.5°F | Wt 252.6 lb

## 2020-03-11 DIAGNOSIS — I10 Essential (primary) hypertension: Secondary | ICD-10-CM

## 2020-03-11 DIAGNOSIS — G6289 Other specified polyneuropathies: Secondary | ICD-10-CM

## 2020-03-11 DIAGNOSIS — I2510 Atherosclerotic heart disease of native coronary artery without angina pectoris: Secondary | ICD-10-CM | POA: Diagnosis not present

## 2020-03-11 DIAGNOSIS — I2583 Coronary atherosclerosis due to lipid rich plaque: Secondary | ICD-10-CM | POA: Diagnosis not present

## 2020-03-11 DIAGNOSIS — M109 Gout, unspecified: Secondary | ICD-10-CM

## 2020-03-11 DIAGNOSIS — R413 Other amnesia: Secondary | ICD-10-CM

## 2020-03-11 DIAGNOSIS — R202 Paresthesia of skin: Secondary | ICD-10-CM

## 2020-03-11 DIAGNOSIS — C670 Malignant neoplasm of trigone of bladder: Secondary | ICD-10-CM | POA: Diagnosis not present

## 2020-03-11 DIAGNOSIS — E559 Vitamin D deficiency, unspecified: Secondary | ICD-10-CM

## 2020-03-11 DIAGNOSIS — R5383 Other fatigue: Secondary | ICD-10-CM

## 2020-03-11 DIAGNOSIS — G629 Polyneuropathy, unspecified: Secondary | ICD-10-CM | POA: Insufficient documentation

## 2020-03-11 LAB — CBC WITH DIFFERENTIAL/PLATELET
Basophils Absolute: 0.1 10*3/uL (ref 0.0–0.1)
Basophils Relative: 0.9 % (ref 0.0–3.0)
Eosinophils Absolute: 0.3 10*3/uL (ref 0.0–0.7)
Eosinophils Relative: 5.1 % — ABNORMAL HIGH (ref 0.0–5.0)
HCT: 36.7 % — ABNORMAL LOW (ref 39.0–52.0)
Hemoglobin: 12.3 g/dL — ABNORMAL LOW (ref 13.0–17.0)
Lymphocytes Relative: 30.1 % (ref 12.0–46.0)
Lymphs Abs: 1.8 10*3/uL (ref 0.7–4.0)
MCHC: 33.4 g/dL (ref 30.0–36.0)
MCV: 92.9 fl (ref 78.0–100.0)
Monocytes Absolute: 0.4 10*3/uL (ref 0.1–1.0)
Monocytes Relative: 6.9 % (ref 3.0–12.0)
Neutro Abs: 3.4 10*3/uL (ref 1.4–7.7)
Neutrophils Relative %: 57 % (ref 43.0–77.0)
Platelets: 147 10*3/uL — ABNORMAL LOW (ref 150.0–400.0)
RBC: 3.95 Mil/uL — ABNORMAL LOW (ref 4.22–5.81)
RDW: 14.6 % (ref 11.5–15.5)
WBC: 5.9 10*3/uL (ref 4.0–10.5)

## 2020-03-11 LAB — COMPREHENSIVE METABOLIC PANEL
ALT: 32 U/L (ref 0–53)
AST: 30 U/L (ref 0–37)
Albumin: 4.5 g/dL (ref 3.5–5.2)
Alkaline Phosphatase: 57 U/L (ref 39–117)
BUN: 14 mg/dL (ref 6–23)
CO2: 29 mEq/L (ref 19–32)
Calcium: 9.7 mg/dL (ref 8.4–10.5)
Chloride: 103 mEq/L (ref 96–112)
Creatinine, Ser: 0.8 mg/dL (ref 0.40–1.50)
GFR: 98.35 mL/min (ref >60.00)
Glucose, Bld: 95 mg/dL (ref 70–99)
Potassium: 4.1 mEq/L (ref 3.5–5.1)
Sodium: 139 mEq/L (ref 135–145)
Total Bilirubin: 0.7 mg/dL (ref 0.2–1.2)
Total Protein: 6.9 g/dL (ref 6.0–8.3)

## 2020-03-11 LAB — VITAMIN B12: Vitamin B-12: 748 pg/mL (ref 211–911)

## 2020-03-11 LAB — VITAMIN D 25 HYDROXY (VIT D DEFICIENCY, FRACTURES): VITD: 38.46 ng/mL (ref 30.00–100.00)

## 2020-03-11 MED ORDER — CLONAZEPAM 1 MG PO TABS
ORAL_TABLET | ORAL | 2 refills | Status: DC
Start: 2020-03-11 — End: 2020-06-18

## 2020-03-11 MED ORDER — HYDROCODONE-ACETAMINOPHEN 7.5-325 MG PO TABS: 0.5000 | ORAL_TABLET | Freq: Four times a day (QID) | ORAL | 0 refills | Status: DC | PRN

## 2020-03-11 NOTE — Assessment & Plan Note (Signed)
Vit D 

## 2020-03-11 NOTE — Assessment & Plan Note (Signed)
Post-pneumonia

## 2020-03-11 NOTE — Assessment & Plan Note (Signed)
F/u w/Urology 

## 2020-03-11 NOTE — Assessment & Plan Note (Signed)
New - probable - post critical illness B complex

## 2020-03-11 NOTE — Progress Notes (Signed)
Subjective:  Patient ID: Christian [name], male    DOB: August 19, 1960  Age: 59 y.o. MRN: [id number]  CC: Follow-up (3 month F/U)   HPI RENAN [name] presents for post-hosp OV - pneumonia, fever C/o leg weakness, pain C/o LBP [name] comes w/wife  Admit date: 02/09/2020 Discharge date: 02/17/2020  Admitted From: Home Disposition: Home  Recommendations for Outpatient Follow-up:  1. Follow up with PCP in 1-2 weeks 2. Follow-up with pulmonology for further evaluation and testing with sleep study scheduled per their expertise  Home Health: None Equipment/Devices: None  Discharge Condition: Stable CODE STATUS: Full Diet recommendation: Low-carb low-salt diet  Brief/Interim Summary: Christian [name] a 59 y.o.malewith PMH of HTN, HLD, low-grade bladder cancer status post TURBT in 2018, chronic pain, gout, obesity, alcohol abuse. Patient presented to the ED on 9/26/2021with 4 days history of high-grade fever, chills, generalized body ache, weakness, chest pain. Not vaccinated against COVID-19. In the ED, patient had a fever of 103.1, tachycardia, tachypnea. COVID-19 PCR negative. WBC count elevated to 10.6, sodium 133, lactic acid normal CT chest showed multilobar pneumonia on the right side. Patient was admitted to hospitalist service. Patient is clinically improving but continues to have intermittent low-grade fever. He has persistent crackles in the right lung.   Patient admitted as above with acute hypoxic respiratory failure in the setting of multilobar pneumonia, sepsis as well as likely concurrent asthma exacerbation.  Patient completed antibiotic course in the hospital, markedly improving at this point, there is concern for aspiration pneumonia and pneumonitis given history of alcohol abuse and dilated esophagus noted on CT scan.  Patient plan for esophagram with outpatient follow-up with PCP.  Patient has been evaluated by pulmonology here, recommended to place patient on Anoro  at discharge with close follow-up for sleep study given his transient hypoxia overnight.  Patient otherwise at this time stable and agreeable for discharge home.    Outpatient Medications Prior to Visit  Medication Sig Dispense Refill  . acetaminophen (TYLENOL) 500 MG tablet Take 1,000 mg by mouth daily.    Christian [name] amLODipine (NORVASC) 5 MG tablet Take 1 tablet (5 mg total) by mouth daily. 90 tablet 3  . ascorbic acid (VITAMIN C) 500 MG tablet Take 500 mg by mouth daily.    Christian [name] atorvastatin (LIPITOR) 20 MG tablet TAKE 1 TABLET BY MOUTH EVERY DAY (Patient taking differently: Take 20 mg by mouth daily. ) 90 tablet 3  . b complex vitamins capsule Take 1 capsule by mouth once a week.    . Cholecalciferol (VITAMIN D3) 2000 units capsule Take 1 capsule (2,000 Units total) by mouth daily. 100 capsule 3  . clonazePAM (KLONOPIN) 1 MG tablet TAKE 1 TABLET BY MOUTH TWICE DAILY AS NEEDED FOR  ANXIETY (Patient taking differently: Take 1 mg by mouth 2 (two) times daily as needed for anxiety. TAKE 1 TABLET BY MOUTH TWICE DAILY AS NEEDED FOR  ANXIETY) 60 tablet 1  . Colchicine 0.6 MG CAPS 1 po qd prn gout attack (Patient taking differently: Take 0.6 mg by mouth daily as needed (gout). 1 po qd prn gout attack) 90 capsule 3  . fenofibrate (TRICOR) 145 MG tablet TAKE 1 TABLET BY MOUTH EVERY DAY (Patient taking differently: Take 145 mg by mouth daily. ) 90 tablet 3  . HYDROcodone-acetaminophen (NORCO) 7.5-325 MG tablet Take 0.5-1 tablets by mouth every 6 (six) hours as needed for moderate pain or severe pain. 40 tablet 0  . irbesartan (AVAPRO) 300 MG tablet TAKE 1 TABLET  BY MOUTH EVERY DAY (Patient taking differently: Take 300 mg by mouth daily. ) 90 tablet 3  . tamsulosin (FLOMAX) 0.4 MG CAPS capsule Take 1 capsule (0.4 mg total) by mouth daily after supper. 90 capsule 3  . umeclidinium-vilanterol (ANORO ELLIPTA) 62.5-25 MCG/INH AEPB Inhale 1 puff into the lungs daily. 30 each 0  . zinc gluconate 50 MG tablet Take 50 mg  by mouth daily.    Christian [name] testosterone (TESTIM) 50 MG/5GM (1%) GEL Place 5 g onto the skin daily. (Patient not taking: Reported on 03/11/2020) 150 g 5   No facility-administered medications prior to visit.    ROS: Review of Systems  Constitutional: Positive for fatigue and unexpected weight change. Negative for appetite change.  HENT: Negative for congestion, nosebleeds, sneezing, sore throat and trouble swallowing.   Eyes: Negative for itching and visual disturbance.  Respiratory: Positive for shortness of breath. Negative for cough.   Cardiovascular: Negative for chest pain, palpitations and leg swelling.  Gastrointestinal: Negative for abdominal distention, blood in stool, diarrhea and nausea.  Genitourinary: Negative for frequency and hematuria.  Musculoskeletal: Negative for back pain, gait problem, joint swelling and neck pain.  Skin: Negative for rash.  Neurological: Negative for dizziness, tremors, speech difficulty and weakness.  Psychiatric/Behavioral: Negative for agitation, dysphoric mood, sleep disturbance and suicidal ideas. The patient is not nervous/anxious.     Objective:  BP (!) 130/92 (BP Location: Left Arm)   Pulse 70   Temp 98.5 F (36.9 C) (Oral)   Wt 252 lb 9.6 oz (114.6 kg)   SpO2 97%   BMI 38.41 kg/m   BP Readings from Last 3 Encounters:  03/11/20 (!) 130/92  02/17/20 120/80  12/10/19 (!) 160/86    Wt Readings from Last 3 Encounters:  03/11/20 252 lb 9.6 oz (114.6 kg)  02/10/20 255 lb (115.7 kg)  12/10/19 (!) 263 lb (119.3 kg)    Physical Exam Constitutional:      General: He is not in acute distress.    Appearance: He is well-developed. He is obese.     Comments: NAD  Eyes:     Conjunctiva/sclera: Conjunctivae normal.     Pupils: Pupils are equal, round, and reactive to light.  Neck:     Thyroid: No thyromegaly.     Vascular: No JVD.  Cardiovascular:     Rate and Rhythm: Normal rate and regular rhythm.     Heart sounds: Normal heart sounds.  No murmur heard.  No friction rub. No gallop.   Pulmonary:     Effort: Pulmonary effort is normal. No respiratory distress.     Breath sounds: Normal breath sounds. No wheezing or rales.  Chest:     Chest wall: No tenderness.  Abdominal:     General: Bowel sounds are normal. There is no distension.     Palpations: Abdomen is soft. There is no mass.     Tenderness: There is no abdominal tenderness. There is no guarding or rebound.  Musculoskeletal:        General: No tenderness. Normal range of motion.     Cervical back: Normal range of motion.  Lymphadenopathy:     Cervical: No cervical adenopathy.  Skin:    General: Skin is warm and dry.     Findings: No rash.  Neurological:     Mental Status: He is alert and oriented to person, place, and time.     Cranial Nerves: No cranial nerve deficit.     Motor: No abnormal muscle tone.  Coordination: Coordination normal.     Gait: Gait normal.     Deep Tendon Reflexes: Reflexes are normal and symmetric.  Psychiatric:        Behavior: Behavior normal.        Thought Content: Thought content normal.        Judgment: Judgment normal.      A total time of >45 minutes was spent preparing to see the patient, reviewing tests, x-rays, operative reports and hospital records.  Also, obtaining history and performing comprehensive physical exam.  Additionally, counseling the patient regarding the above listed issues.   Finally, documenting clinical information in the health records, coordination of care, educating the patient on wt loss, alcohol use, neuropathy. It is a complex case.  Lab Results  Component Value Date   WBC 10.7 (H) 02/16/2020   HGB 9.5 (L) 02/16/2020   HCT 30.4 (L) 02/16/2020   PLT 316 02/16/2020   GLUCOSE 138 (H) 02/16/2020   CHOL 150 02/10/2020   TRIG 137 02/10/2020   HDL 27 (L) 02/10/2020   LDLDIRECT 153.0 10/29/2019   LDLCALC 96 02/10/2020   ALT 46 (H) 02/10/2020   AST 32 02/10/2020   NA 139 02/16/2020   K 3.8  02/16/2020   CL 101 02/16/2020   CREATININE 0.76 02/16/2020   BUN 11 02/16/2020   CO2 27 02/16/2020   TSH 1.424 02/10/2020   PSA 0.92 10/29/2019   INR 1.01 03/28/2012   HGBA1C 5.4 02/10/2020    CT CHEST ABDOMEN PELVIS W CONTRAST  Result Date: 02/10/2020 CLINICAL DATA:  Cough, fever and chills 4 days. Generalized body aches. EXAM: CT CHEST, ABDOMEN, AND PELVIS WITH CONTRAST TECHNIQUE: Multidetector CT imaging of the chest, abdomen and pelvis was performed following the standard protocol during bolus administration of intravenous contrast. CONTRAST:  161mL OMNIPAQUE IOHEXOL 300 MG/ML  SOLN COMPARISON:  Chest x-ray 02/09/2020 FINDINGS: CT CHEST FINDINGS Cardiovascular: The heart is normal in size. No pericardial effusion. The aorta is normal in caliber. Scattered atherosclerotic calcifications. No dissection. The branch vessels are patent. Age advanced three-vessel coronary artery calcifications are noted. Mediastinum/Nodes: Borderline mediastinal and right hilar lymph nodes likely reactive/inflammatory due to the right lung process. Mild distal esophageal wall thickening likely esophagitis. Lungs/Pleura: Extensive airspace consolidation/pneumonia involving the posterior aspect of the right upper lobe and the medial aspect of the entire right lower lobe. Findings consistent with poly lobar pneumonia. The left lung is clear. No pleural effusions. No worrisome pulmonary lesions. Musculoskeletal: No significant bony findings. CT ABDOMEN PELVIS FINDINGS Hepatobiliary: No hepatic lesions or intrahepatic biliary dilatation. Mild diffuse fatty infiltration of the liver is noted. The gallbladder is unremarkable. No intra or extrahepatic biliary dilatation. Pancreas: No mass, inflammation or ductal dilatation. Spleen: Mild stable splenomegaly. The spleen measures 15.5 x 13.0 x 11.0 cm. No focal lesions. Adrenals/Urinary Tract: 3.4 x 2.8 cm right adrenal gland lesion unchanged since 2017 and consistent with benign  adenoma. The left adrenal gland is normal. The kidneys are unremarkable and stable. The bladder is unremarkable. Stomach/Bowel: Stomach the stomach, duodenum, small bowel and colon are grossly normal without oral contrast. No acute inflammatory changes, mass lesions or obstructive findings. The terminal ileum is normal. The appendix is normal. Vascular/Lymphatic: Slightly progressive aortic calcifications. No aneurysm or dissection. The branch vessels are patent. The major venous structures are patent. No mesenteric or retroperitoneal mass or adenopathy. Reproductive: The prostate gland and seminal vesicles are unremarkable. Other: No pelvic mass or adenopathy. No free pelvic fluid collections. No inguinal mass  or adenopathy. No abdominal wall hernia or subcutaneous lesions. Musculoskeletal: No significant bony findings. IMPRESSION: 1. Polylobar pneumonia involving the right upper lobe and right lower lobe. 2. Borderline mediastinal and right hilar lymph nodes, likely reactive/inflammatory due to the right lung process. 3. Age advanced three-vessel coronary artery calcifications. 4. Stable right adrenal gland adenoma. 5. Stable mild splenomegaly. 6. Mild diffuse fatty infiltration of the liver. 7. Aortic atherosclerosis. Aortic Atherosclerosis (ICD10-I70.0). Electronically Signed   By: Marijo Sanes M.D.   On: 02/10/2020 16:08   DG Chest Port 1 View  Result Date: 02/09/2020 CLINICAL DATA:  Central chest pain for 2 days, shortness of breath EXAM: PORTABLE CHEST 1 VIEW COMPARISON:  Coronary CT 03/25/2019, radiograph 11/30/2011 FINDINGS: Mild central vascular congestion with some hazy opacity towards the lung bases. Question some peripheral septal lines as well. Cardiac silhouette appears enlarged from prior portable radiography though could be accentuated by some low lung volumes. The aorta is calcified. The remaining cardiomediastinal contours are unremarkable. No pneumothorax or effusion. No acute osseous or  soft tissue abnormality. Degenerative changes are present in the imaged spine and shoulders. IMPRESSION: 1. Mild central vascular congestion with some hazy opacity towards the lung bases, suspicious for mild interstitial edema though atypical infection could present similarly given the setting of fever. Atelectatic changes likely present as well. 2. Cardiac silhouette appears enlarged from prior portable radiography though could be accentuated by some low lung volumes. Electronically Signed   By: Lovena Le M.D.   On: 02/09/2020 20:39    Assessment & Plan:    Walker Kehr, MD

## 2020-03-11 NOTE — Assessment & Plan Note (Signed)
Norvasc Added Irbesartan,

## 2020-03-11 NOTE — Patient Instructions (Signed)
You can try Lion's Mane Mushroom extract or capsules for memory   

## 2020-03-11 NOTE — Assessment & Plan Note (Signed)
No relapse 

## 2020-03-11 NOTE — Assessment & Plan Note (Signed)
On Lipitor, ASA 81

## 2020-03-11 NOTE — Assessment & Plan Note (Signed)
You can try Lion's Mane Mushroom extract or capsules for memory   

## 2020-03-15 ENCOUNTER — Other Ambulatory Visit: Payer: Self-pay | Admitting: Internal Medicine

## 2020-03-16 ENCOUNTER — Ambulatory Visit: Payer: 59 | Admitting: Pulmonary Disease

## 2020-03-16 ENCOUNTER — Other Ambulatory Visit: Payer: Self-pay

## 2020-03-16 ENCOUNTER — Encounter: Payer: Self-pay | Admitting: Pulmonary Disease

## 2020-03-16 VITALS — BP 130/82 | HR 75 | Temp 97.3°F | Ht 68.0 in | Wt 255.0 lb

## 2020-03-16 DIAGNOSIS — J189 Pneumonia, unspecified organism: Secondary | ICD-10-CM

## 2020-03-16 DIAGNOSIS — J45909 Unspecified asthma, uncomplicated: Secondary | ICD-10-CM

## 2020-03-16 DIAGNOSIS — R0683 Snoring: Secondary | ICD-10-CM

## 2020-03-16 DIAGNOSIS — K2289 Other specified disease of esophagus: Secondary | ICD-10-CM

## 2020-03-16 NOTE — Telephone Encounter (Signed)
Med not on med list pls advise../lmb 

## 2020-03-16 NOTE — Patient Instructions (Addendum)
We will schedule you for home sleep study Continue to take anoro daily and albuterol inhaler as needed We will refer you to Gastroenterology for further evaluation of GERD and esophageal thickening We will schedule you for CT Chest scan in 2 months with follow up visit after
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
## 2020-03-19 ENCOUNTER — Other Ambulatory Visit: Payer: 59 | Admitting: Urology

## 2020-03-26 ENCOUNTER — Telehealth: Payer: Self-pay | Admitting: Pulmonary Disease

## 2020-03-26 NOTE — Telephone Encounter (Signed)
LMTCB for pt 

## 2020-03-27 MED ORDER — ANORO ELLIPTA 62.5-25 MCG/INH IN AEPB: 1.0000 | INHALATION_SPRAY | Freq: Every day | RESPIRATORY_TRACT | 2 refills | Status: DC

## 2020-03-27 NOTE — Telephone Encounter (Signed)
Called and spoke with Patient's Wife Lyndee Leo. Lyndee Leo requested a refill for Patient.  Lyndee Leo stated Patient has run out of Anoro this week, and Dr. Erin Fulling recommended him to stay on Anoro. Anoro prescription sent to requested [facility].  Nothing further at this time.  Per last AVS- 03/16/20- Dr. Erin Fulling  Instructions    Return in about 2 months (around 05/16/2020). We will schedule you for home sleep study Continue to take anoro daily and albuterol inhaler as needed We will refer you to Gastroenterology for further evaluation of GERD and esophageal thickening We will schedule you for CT Chest scan in 2 months with follow up visit after

## 2020-04-17 ENCOUNTER — Other Ambulatory Visit: Payer: Self-pay

## 2020-04-17 ENCOUNTER — Ambulatory Visit: Payer: 59

## 2020-04-17 DIAGNOSIS — R0683 Snoring: Secondary | ICD-10-CM

## 2020-04-17 DIAGNOSIS — G4733 Obstructive sleep apnea (adult) (pediatric): Secondary | ICD-10-CM | POA: Diagnosis not present

## 2020-04-22 ENCOUNTER — Encounter: Payer: Self-pay | Admitting: Urology

## 2020-04-22 ENCOUNTER — Ambulatory Visit (INDEPENDENT_AMBULATORY_CARE_PROVIDER_SITE_OTHER): Payer: 59 | Admitting: Urology

## 2020-04-22 ENCOUNTER — Other Ambulatory Visit: Payer: Self-pay

## 2020-04-22 VITALS — BP 131/82 | HR 72 | Temp 98.5°F | Ht 68.0 in | Wt 255.0 lb

## 2020-04-22 DIAGNOSIS — C670 Malignant neoplasm of trigone of bladder: Secondary | ICD-10-CM

## 2020-04-22 DIAGNOSIS — C672 Malignant neoplasm of lateral wall of bladder: Secondary | ICD-10-CM

## 2020-04-22 DIAGNOSIS — D494 Neoplasm of unspecified behavior of bladder: Secondary | ICD-10-CM

## 2020-04-22 LAB — URINALYSIS, ROUTINE W REFLEX MICROSCOPIC
Bilirubin, UA: NEGATIVE
Glucose, UA: NEGATIVE
Ketones, UA: NEGATIVE
Leukocytes,UA: NEGATIVE
Nitrite, UA: NEGATIVE
Protein,UA: NEGATIVE
RBC, UA: NEGATIVE
Specific Gravity, UA: 1.025 (ref 1.005–1.030)
Urobilinogen, Ur: 0.2 mg/dL (ref 0.2–1.0)
pH, UA: 5 (ref 5.0–7.5)

## 2020-04-22 MED ORDER — CIPROFLOXACIN HCL 500 MG PO TABS
500.0000 mg | ORAL_TABLET | Freq: Once | ORAL | Status: AC
  Administered 2020-04-22: 500 mg via ORAL
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
## 2020-04-22 NOTE — Progress Notes (Signed)
Urological Symptom Review  Patient is experiencing the following symptoms: Hard to postpone urination Get up at night to urinate Leakage of urine   Review of Systems  Gastrointestinal (upper)  : Negative for upper GI symptoms  Gastrointestinal (lower) : Negative for lower GI symptoms  Constitutional : Negative for symptoms  Skin: Negative for skin symptoms  Eyes: Negative for eye symptoms  Ear/Nose/Throat : Negative for Ear/Nose/Throat symptoms  Hematologic/Lymphatic: Negative for Hematologic/Lymphatic symptoms  Cardiovascular : Negative for cardiovascular symptoms  Respiratory : Negative for respiratory symptoms  Endocrine: Negative for endocrine symptoms  Musculoskeletal: Back pain Joint pain  Neurological: Negative for neurological symptoms  Psychologic: Anxiety

## 2020-04-22 NOTE — Patient Instructions (Signed)
Bladder Cancer  Bladder cancer is an abnormal growth of tissue in the bladder. The bladder is the balloon-like sac in the pelvis. It collects and stores urine that comes from the kidneys through the ureters. The bladder wall is made of layers. If cancer spreads into these layers and through the wall of the bladder, it becomes more difficult to treat. What are the causes? The cause of this condition is not known. What increases the risk? The following factors may make you more likely to develop this condition:  Smoking.  Workplace risks (occupational exposures), such as rubber, leather, textile, dyes, chemicals, and paint.  Being white.  Your age. Most people with bladder cancer are over the age of 55.  Being male.  Having chronic bladder inflammation.  Having a personal history of bladder cancer.  Having a family history of bladder cancer (heredity).  Having had chemotherapy or radiation therapy to the pelvis.  Having been exposed to arsenic. What are the signs or symptoms? Initial symptoms of this condition include:  Blood in the urine.  Painful urination.  Frequent bladder or urine infections.  Increase in urgency and frequency of urination. Advanced symptoms of this condition include:  Not being able to urinate.  Low back pain on one side.  Loss of appetite.  Weight loss.  Fatigue.  Swelling in the feet.  Bone pain. How is this diagnosed? This condition is diagnosed based on your medical history, a physical exam, urine tests, lab tests, imaging tests, and your symptoms. You may also have other tests or procedures done, such as:  A narrow tube being inserted into your bladder through your urethra (cystoscopy) in order to view the lining of your bladder for tumors.  A biopsy to sample the tumor to see if cancer is present. If cancer is present, it will then be staged to determine its severity and extent. Staging is an assessment of:  The size of the  tumor.  Whether the cancer has spread.  Where the cancer has spread. It is important to know how deeply into the bladder wall cancer has grown and whether cancer has spread to any other parts of your body. Staging may require blood tests or imaging tests, such as a CT scan, MRI, bone scan, or chest X-ray. How is this treated? Based on the stage of cancer, one treatment or a combination of treatments may be recommended. The most common forms of treatment are:  Surgery to remove the cancer. Procedures that may be done include transurethral resection and cystectomy.  Radiation therapy. This is high-energy X-rays or other particles. This is often used in combination with chemotherapy.  Chemotherapy. During this treatment, medicines are used to kill cancer cells.  Immunotherapy. This uses medicines to help your own immune system destroy cancer cells. Follow these instructions at home:  Take over-the-counter and prescription medicines only as told by your health care provider.  Maintain a healthy diet. Some of your treatments might affect your appetite.  Consider joining a support group. This may help you learn to cope with the stress of having bladder cancer.  Tell your cancer care team if you develop side effects. They may be able to recommend ways to relieve them.  Keep all follow-up visits as told by your health care provider. This is important. Where to find more information  American Cancer Society: www.cancer.org  National Cancer Institute (NCI): www.cancer.gov Contact a health care provider if:  You have symptoms of a urinary tract infection. These include: ?   Fever. ? Chills. ? Weakness. ? Muscle aches. ? Abdominal pain. ? Frequent and intense urge to urinate. ? Burning feeling in the bladder or urethra during urination. Get help right away if:  There is blood in your urine.  You cannot urinate.  You have severe pain or other symptoms that do not go  away. Summary  Bladder cancer is an abnormal growth of tissue in the bladder.  This condition is diagnosed based on your medical history, a physical exam, urine tests, lab tests, imaging tests, and your symptoms.  Based on the stage of cancer, surgery, chemotherapy, or a combination of treatments may be recommended.  Consider joining a support group. This may help you learn to cope with the stress of having bladder cancer. This information is not intended to replace advice given to you by your health care provider. Make sure you discuss any questions you have with your health care provider. Document Revised: 04/14/2017 Document Reviewed: 04/05/2016 Elsevier Patient Education  2020 Elsevier Inc.  

## 2020-04-30 ENCOUNTER — Telehealth: Payer: Self-pay | Admitting: Internal Medicine

## 2020-04-30 NOTE — Telephone Encounter (Signed)
1.Medication Requested:   HYDROcodone-acetaminophen (NORCO) 7.5-325 MG tablet  indomethacin 50 MG capsule (INDOCIN) (pt would like three refills of this medication, not on current formulary, but pt states he needs for foot pain so he walk  2. Pharmacy (Name, Street, [place]):  [id number] - [place], [place] - [address] Phone:  [phone]  Fax:  [phone]      3. On Med List: Y (hydrocodone)  4. Last Visit with PCP:  10.27.21  5. Next visit date with PCP: 12.28.21   Agent: Please be advised that RX refills may take up to 3 business days. We ask that you follow-up with your pharmacy.

## 2020-05-01 MED ORDER — HYDROCODONE-ACETAMINOPHEN 7.5-325 MG PO TABS: 0.5000 | ORAL_TABLET | Freq: Four times a day (QID) | ORAL | 0 refills | Status: DC | PRN

## 2020-05-01 MED ORDER — INDOMETHACIN 50 MG PO CAPS
ORAL_CAPSULE | ORAL | 1 refills | Status: DC
Start: 2020-05-01 — End: 2020-05-25

## 2020-05-01 NOTE — Telephone Encounter (Signed)
Okay.  Thanks.

## 2020-05-05 ENCOUNTER — Telehealth: Payer: Self-pay | Admitting: Pulmonary Disease

## 2020-05-05 DIAGNOSIS — G4733 Obstructive sleep apnea (adult) (pediatric): Secondary | ICD-10-CM | POA: Diagnosis not present

## 2020-05-05 NOTE — Telephone Encounter (Signed)
Called the pt and there was no answer- LMTCB and will hold in triage to try again 05/06/20

## 2020-05-05 NOTE — Telephone Encounter (Signed)
Call patient  Sleep study result  Date of study: 04/19/2020  Impression: Severe obstructive sleep apnea Mild oxygen desaturations  Recommendation: DME referral  Recommend CPAP therapy for severe obstructive sleep apnea  Auto titrating CPAP with pressure settings of 5-20 will be appropriate  Encourage weight loss measures  Follow-up in the office 4 to 6 weeks following initiation of treatment   FYI: Dr. Erin Fulling

## 2020-05-06 NOTE — Telephone Encounter (Signed)
Spoke with the pt and notified of results/recs per Dr Ander Slade  He verbalized understanding  Order for CPAP was sent to [facility]  Pt aware to call for appt 31-90 days after he receives his machine

## 2020-05-12 ENCOUNTER — Ambulatory Visit: Payer: 59 | Admitting: Internal Medicine

## 2020-05-14 ENCOUNTER — Other Ambulatory Visit: Payer: Self-pay

## 2020-05-14 ENCOUNTER — Ambulatory Visit (INDEPENDENT_AMBULATORY_CARE_PROVIDER_SITE_OTHER)
Admission: RE | Admit: 2020-05-14 | Discharge: 2020-05-14 | Disposition: A | Discharge status: Home / Self Care | Payer: 59 | Source: Ambulatory Visit | Attending: Pulmonary Disease | Admitting: Pulmonary Disease

## 2020-05-14 DIAGNOSIS — J189 Pneumonia, unspecified organism: Secondary | ICD-10-CM | POA: Diagnosis not present

## 2020-05-19 NOTE — Telephone Encounter (Signed)
Thank you for your help.  Jon 

## 2020-05-24 ENCOUNTER — Other Ambulatory Visit: Payer: Self-pay | Admitting: Internal Medicine

## 2020-06-18 ENCOUNTER — Ambulatory Visit: Payer: 59 | Admitting: Internal Medicine

## 2020-06-18 ENCOUNTER — Other Ambulatory Visit: Payer: Self-pay

## 2020-06-18 ENCOUNTER — Encounter: Payer: Self-pay | Admitting: Internal Medicine

## 2020-06-18 VITALS — BP 128/72 | HR 80 | Temp 98.6°F | Ht 68.0 in | Wt 264.4 lb

## 2020-06-18 DIAGNOSIS — R739 Hyperglycemia, unspecified: Secondary | ICD-10-CM | POA: Diagnosis not present

## 2020-06-18 DIAGNOSIS — G8929 Other chronic pain: Secondary | ICD-10-CM

## 2020-06-18 DIAGNOSIS — M545 Low back pain, unspecified: Secondary | ICD-10-CM

## 2020-06-18 DIAGNOSIS — I10 Essential (primary) hypertension: Secondary | ICD-10-CM | POA: Diagnosis not present

## 2020-06-18 DIAGNOSIS — R413 Other amnesia: Secondary | ICD-10-CM | POA: Diagnosis not present

## 2020-06-18 LAB — HEMOGLOBIN A1C: Hgb A1c MFr Bld: 6.2 % (ref 4.6–6.5)

## 2020-06-18 LAB — COMPREHENSIVE METABOLIC PANEL
ALT: 68 U/L — ABNORMAL HIGH (ref 0–53)
AST: 47 U/L — ABNORMAL HIGH (ref 0–37)
Albumin: 4.6 g/dL (ref 3.5–5.2)
Alkaline Phosphatase: 62 U/L (ref 39–117)
BUN: 15 mg/dL (ref 6–23)
CO2: 29 mEq/L (ref 19–32)
Calcium: 10.1 mg/dL (ref 8.4–10.5)
Chloride: 104 mEq/L (ref 96–112)
Creatinine, Ser: 0.89 mg/dL (ref 0.40–1.50)
GFR: 95.06 mL/min (ref >60.00)
Glucose, Bld: 98 mg/dL (ref 70–99)
Potassium: 4 mEq/L (ref 3.5–5.1)
Sodium: 139 mEq/L (ref 135–145)
Total Bilirubin: 0.5 mg/dL (ref 0.2–1.2)
Total Protein: 7.3 g/dL (ref 6.0–8.3)

## 2020-06-18 LAB — URINALYSIS
Bilirubin Urine: NEGATIVE
Hgb urine dipstick: NEGATIVE
Ketones, ur: NEGATIVE
Leukocytes,Ua: NEGATIVE
Nitrite: NEGATIVE
Specific Gravity, Urine: 1.02 (ref 1.000–1.030)
Total Protein, Urine: NEGATIVE
Urine Glucose: NEGATIVE
Urobilinogen, UA: 0.2 (ref 0.0–1.0)
pH: 6 (ref 5.0–8.0)

## 2020-06-18 LAB — TSH: TSH: 1.83 u[IU]/mL (ref 0.35–4.50)

## 2020-06-18 MED ORDER — HYDROCODONE-ACETAMINOPHEN 7.5-325 MG PO TABS: 0.5000 | ORAL_TABLET | Freq: Four times a day (QID) | ORAL | 0 refills | Status: DC | PRN

## 2020-06-18 MED ORDER — CLONAZEPAM 1 MG PO TABS
ORAL_TABLET | ORAL | 2 refills | Status: DC
Start: 2020-06-18 — End: 2020-12-17

## 2020-06-18 NOTE — Assessment & Plan Note (Signed)
A little better 

## 2020-06-18 NOTE — Patient Instructions (Signed)
For a mild COVID-19 case you can take zinc 50 mg a day for 1 week, vitamin C 1000 mg daily for 1 week, vitamin D2 50,000 units weekly for 2 months (unless you are taking vitamin D daily already), Quercetin 500 mg twice a day for 1 week (if you can get it quick enough).  Maintain good oral hydration and take Tylenol with high fever.   You can buy COVID-19 express tests for home use. They are inexpensive, roughly $15 for 2 tests.  There is a link below: https://[email or web address]/[email or web address]-test-kit.[email or web address]  You can order 4 free COVID-19 home tests via [employer]  

## 2020-06-18 NOTE — Progress Notes (Signed)
Subjective:  Patient ID: Christian [name], male    DOB: 1960-08-18  Age: 60 y.o. MRN: [id number]  CC: Follow-up (3 MONTH F/U)   HPI Christian [name] presents for   Outpatient Medications Prior to Visit  Medication Sig Dispense Refill  . acetaminophen (TYLENOL) 500 MG tablet Take 1,000 mg by mouth daily.    [name] amLODipine (NORVASC) 5 MG tablet Take 1 tablet (5 mg total) by mouth daily. 90 tablet 3  . ascorbic acid (VITAMIN C) 500 MG tablet Take 500 mg by mouth daily.    [name] b complex vitamins capsule Take 1 capsule by mouth once a week.    . Cholecalciferol (VITAMIN D3) 2000 units capsule Take 1 capsule (2,000 Units total) by mouth daily. 100 capsule 3  . clonazePAM (KLONOPIN) 1 MG tablet TAKE 1 TABLET BY MOUTH TWICE DAILY AS NEEDED FOR  ANXIETY 60 tablet 2  . Colchicine 0.6 MG CAPS 1 po qd prn gout attack (Patient taking differently: Take 0.6 mg by mouth daily as needed (gout). 1 po qd prn gout attack) 90 capsule 3  . fenofibrate (TRICOR) 145 MG tablet TAKE 1 TABLET BY MOUTH EVERY DAY (Patient taking differently: Take 145 mg by mouth daily.) 90 tablet 3  . HYDROcodone-acetaminophen (NORCO) 7.5-325 MG tablet Take 0.5-1 tablets by mouth every 6 (six) hours as needed for moderate pain or severe pain. 60 tablet 0  . indomethacin (INDOCIN) 50 MG capsule TAKE 1 CAPSULE 3 TIMES DAILY AS NEEDED FOR MODERATE PAIN. OFFICE VISIT NEEDED BEFORE REFILLS 30 capsule 1  . irbesartan (AVAPRO) 300 MG tablet TAKE 1 TABLET BY MOUTH EVERY DAY (Patient taking differently: Take 300 mg by mouth daily.) 90 tablet 3  . umeclidinium-vilanterol (ANORO ELLIPTA) 62.5-25 MCG/INH AEPB Inhale 1 puff into the lungs daily. 60 each 2  . zinc gluconate 50 MG tablet Take 50 mg by mouth daily.    [name] atorvastatin (LIPITOR) 20 MG tablet Take 20 mg by mouth daily.     No facility-administered medications prior to visit.    ROS: Review of Systems  Objective:  BP 128/72 (BP Location: Left Arm)   Pulse 80   Temp 98.6 F (37 C)  (Oral)   Ht 5\' 8"  (1.727 m)   Wt 264 lb 6.4 oz (119.9 kg)   SpO2 95%   BMI 40.20 kg/m   BP Readings from Last 3 Encounters:  06/18/20 128/72  04/22/20 131/82  03/16/20 130/82    Wt Readings from Last 3 Encounters:  06/18/20 264 lb 6.4 oz (119.9 kg)  04/22/20 255 lb (115.7 kg)  03/16/20 255 lb (115.7 kg)    Physical Exam  Lab Results  Component Value Date   WBC 5.9 03/11/2020   HGB 12.3 (L) 03/11/2020   HCT 36.7 (L) 03/11/2020   PLT 147.0 (L) 03/11/2020   GLUCOSE 95 03/11/2020   CHOL 150 02/10/2020   TRIG 137 02/10/2020   HDL 27 (L) 02/10/2020   LDLDIRECT 153.0 10/29/2019   LDLCALC 96 02/10/2020   ALT 32 03/11/2020   AST 30 03/11/2020   NA 139 03/11/2020   K 4.1 03/11/2020   CL 103 03/11/2020   CREATININE 0.80 03/11/2020   BUN 14 03/11/2020   CO2 29 03/11/2020   TSH 1.424 02/10/2020   PSA 0.92 10/29/2019   INR 1.01 03/28/2012   HGBA1C 5.4 02/10/2020    CT Chest Wo Contrast  Result Date: 05/14/2020 CLINICAL DATA:  Pneumonia, effusion or abscess suspected, xray done Follow-up right-sided pneumonia. EXAM:  CT CHEST WITHOUT CONTRAST TECHNIQUE: Multidetector CT imaging of the chest was performed following the standard protocol without IV contrast. COMPARISON:  Most recent radiograph 02/13/2020. Most recent CT 02/10/2020 FINDINGS: Cardiovascular: Cardiac motion artifact through the ascending aorta. No aortic aneurysm. There is mild aortic atherosclerosis. Upper normal heart size. There are coronary artery calcifications. No pericardial effusion. Mediastinum/Nodes: Scattered small mediastinal lymph nodes, decreased in size from prior exam. Limited assessment for hilar adenopathy in the absence of IV contrast. There is persistent and increasing distal esophageal wall thickening with a small hiatal hernia. No visualized thyroid nodule. Lungs/Pleura: Resolution of the previous right upper and lower lobe airspace disease. There is trace subsegmental scarring in the right lower  lobe. No evidence of underlying malignancy. Subsegmental atelectasis in the left lower lobe. There is a calcified granuloma in the right lower lobe trachea and central bronchi are patent. There is no pleural fluid. Upper Abdomen: No acute or unexpected findings. Musculoskeletal: Remote right rib fractures. Multilevel thoracic spondylosis. There are no acute or suspicious osseous abnormalities. Stable sclerotic density within left seventh rib, likely bone island. IMPRESSION: 1. Resolution of previous right upper and lower lobe pneumonia. Minimal subsegmental scarring in the right lower lobe. No evidence of underlying malignancy. 2. Persistent and increased increasing distal esophageal wall thickening with a small hiatal hernia, can be seen with reflux or esophagitis. In the absence of esophagitis symptoms, endoscopy could be considered to exclude underlying mass. 3. Coronary artery calcifications. Aortic Atherosclerosis (ICD10-I70.0). Electronically Signed   By: Keith Rake M.D.   On: 05/14/2020 21:44    Assessment & Plan:   There are no diagnoses linked to this encounter.   No orders of the defined types were placed in this encounter.    Follow-up: Return in about 3 months (around 09/15/2020).  Walker Kehr, MD

## 2020-06-18 NOTE — Assessment & Plan Note (Signed)
Better Irbesartan, Norvasc

## 2020-10-11 ENCOUNTER — Other Ambulatory Visit: Payer: Self-pay | Admitting: Internal Medicine

## 2020-10-21 ENCOUNTER — Other Ambulatory Visit: Payer: 59 | Admitting: Urology

## 2020-12-15 ENCOUNTER — Telehealth: Payer: Self-pay | Admitting: Internal Medicine

## 2020-12-15 NOTE — Telephone Encounter (Signed)
1.Medication Requested: HYDROcodone-acetaminophen (NORCO) 7.5-325 MG tablet  2. Pharmacy (Name, Street, [place]):  [id number] [place],  - [address] Phone:  [phone] Fax:  [phone]     3. On Med List: yes  4. Last Visit with PCP: 02.03.22  5. Next visit date with PCP: n/a   Agent: Please be advised that RX refills may take up to 3 business days. We ask that you follow-up with your pharmacy.

## 2020-12-16 ENCOUNTER — Other Ambulatory Visit: Payer: Self-pay | Admitting: Internal Medicine

## 2020-12-16 NOTE — Telephone Encounter (Signed)
Check [place] registry last filled 06/18/2020.[name]/LMB

## 2020-12-18 MED ORDER — HYDROCODONE-ACETAMINOPHEN 7.5-325 MG PO TABS: 0.5000 | ORAL_TABLET | Freq: Four times a day (QID) | ORAL | 0 refills | Status: DC | PRN

## 2020-12-18 NOTE — Telephone Encounter (Signed)
Okay.  Done.  Needs office visit every 3 months.  Thanks

## 2021-03-10 ENCOUNTER — Telehealth: Payer: 59 | Admitting: Internal Medicine

## 2021-03-10 DIAGNOSIS — R739 Hyperglycemia, unspecified: Secondary | ICD-10-CM

## 2021-03-10 DIAGNOSIS — Z125 Encounter for screening for malignant neoplasm of prostate: Secondary | ICD-10-CM

## 2021-03-10 DIAGNOSIS — Z0000 Encounter for general adult medical examination without abnormal findings: Secondary | ICD-10-CM

## 2021-03-10 DIAGNOSIS — E559 Vitamin D deficiency, unspecified: Secondary | ICD-10-CM

## 2021-03-10 DIAGNOSIS — E291 Testicular hypofunction: Secondary | ICD-10-CM

## 2021-03-10 DIAGNOSIS — I10 Essential (primary) hypertension: Secondary | ICD-10-CM

## 2021-03-10 NOTE — Telephone Encounter (Signed)
Patient spouse calling in  Patient has CPE scheduled for 11/10  Would like for provider to go ahead & put in lab orders so they can discuss at upcoming visit  Please call patient spouse when order has been placed [phone]

## 2021-03-10 NOTE — Telephone Encounter (Signed)
Notified wife labs has been entered can go to [facility] 2-3 days prior to have done.Marland KitchenJohny [name]

## 2021-03-12 ENCOUNTER — Other Ambulatory Visit: Payer: Self-pay | Admitting: Internal Medicine

## 2021-03-22 ENCOUNTER — Other Ambulatory Visit (INDEPENDENT_AMBULATORY_CARE_PROVIDER_SITE_OTHER): Payer: 59

## 2021-03-22 DIAGNOSIS — R739 Hyperglycemia, unspecified: Secondary | ICD-10-CM

## 2021-03-22 DIAGNOSIS — I10 Essential (primary) hypertension: Secondary | ICD-10-CM

## 2021-03-22 DIAGNOSIS — Z125 Encounter for screening for malignant neoplasm of prostate: Secondary | ICD-10-CM

## 2021-03-22 DIAGNOSIS — Z0000 Encounter for general adult medical examination without abnormal findings: Secondary | ICD-10-CM | POA: Diagnosis not present

## 2021-03-22 DIAGNOSIS — E559 Vitamin D deficiency, unspecified: Secondary | ICD-10-CM

## 2021-03-22 LAB — HEPATIC FUNCTION PANEL
ALT: 54 U/L — ABNORMAL HIGH (ref 0–53)
AST: 40 U/L — ABNORMAL HIGH (ref 0–37)
Albumin: 5.3 g/dL — ABNORMAL HIGH (ref 3.5–5.2)
Alkaline Phosphatase: 80 U/L (ref 39–117)
Bilirubin, Direct: 0.1 mg/dL (ref 0.0–0.3)
Total Bilirubin: 0.8 mg/dL (ref 0.2–1.2)
Total Protein: 7.9 g/dL (ref 6.0–8.3)

## 2021-03-22 LAB — URINALYSIS, ROUTINE W REFLEX MICROSCOPIC
Bilirubin Urine: NEGATIVE
Hgb urine dipstick: NEGATIVE
Ketones, ur: NEGATIVE
Leukocytes,Ua: NEGATIVE
Nitrite: NEGATIVE
RBC / HPF: NONE SEEN (ref 0–?)
Specific Gravity, Urine: 1.02 (ref 1.000–1.030)
Total Protein, Urine: NEGATIVE
Urine Glucose: NEGATIVE
Urobilinogen, UA: 0.2 (ref 0.0–1.0)
pH: 6 (ref 5.0–8.0)

## 2021-03-22 LAB — CBC WITH DIFFERENTIAL/PLATELET
Basophils Absolute: 0.1 10*3/uL (ref 0.0–0.1)
Basophils Relative: 0.9 % (ref 0.0–3.0)
Eosinophils Absolute: 0.4 10*3/uL (ref 0.0–0.7)
Eosinophils Relative: 4.2 % (ref 0.0–5.0)
HCT: 42.2 % (ref 39.0–52.0)
Hemoglobin: 14.3 g/dL (ref 13.0–17.0)
Lymphocytes Relative: 20.9 % (ref 12.0–46.0)
Lymphs Abs: 2.1 10*3/uL (ref 0.7–4.0)
MCHC: 34 g/dL (ref 30.0–36.0)
MCV: 92.7 fl (ref 78.0–100.0)
Monocytes Absolute: 0.5 10*3/uL (ref 0.1–1.0)
Monocytes Relative: 5.3 % (ref 3.0–12.0)
Neutro Abs: 6.8 10*3/uL (ref 1.4–7.7)
Neutrophils Relative %: 68.7 % (ref 43.0–77.0)
Platelets: 200 10*3/uL (ref 150.0–400.0)
RBC: 4.56 Mil/uL (ref 4.22–5.81)
RDW: 13.2 % (ref 11.5–15.5)
WBC: 9.9 10*3/uL (ref 4.0–10.5)

## 2021-03-22 LAB — BASIC METABOLIC PANEL
BUN: 18 mg/dL (ref 6–23)
CO2: 28 mEq/L (ref 19–32)
Calcium: 10.4 mg/dL (ref 8.4–10.5)
Chloride: 101 mEq/L (ref 96–112)
Creatinine, Ser: 0.91 mg/dL (ref 0.40–1.50)
GFR: 92.99 mL/min (ref >60.00)
Glucose, Bld: 110 mg/dL — ABNORMAL HIGH (ref 70–99)
Potassium: 4.5 mEq/L (ref 3.5–5.1)
Sodium: 139 mEq/L (ref 135–145)

## 2021-03-22 LAB — LIPID PANEL
Cholesterol: 260 mg/dL — ABNORMAL HIGH (ref 0–200)
HDL: 41.3 mg/dL (ref >39.00)
NonHDL: 218.75
Total CHOL/HDL Ratio: 6
Triglycerides: 263 mg/dL — ABNORMAL HIGH (ref 0.0–149.0)
VLDL: 52.6 mg/dL — ABNORMAL HIGH (ref 0.0–40.0)

## 2021-03-22 LAB — PSA: PSA: 0.52 ng/mL (ref 0.10–4.00)

## 2021-03-22 LAB — TSH: TSH: 2.39 u[IU]/mL (ref 0.35–5.50)

## 2021-03-22 LAB — LDL CHOLESTEROL, DIRECT: Direct LDL: 191 mg/dL

## 2021-03-22 LAB — VITAMIN D 25 HYDROXY (VIT D DEFICIENCY, FRACTURES): VITD: 35.27 ng/mL (ref 30.00–100.00)

## 2021-03-25 ENCOUNTER — Other Ambulatory Visit: Payer: Self-pay

## 2021-03-25 ENCOUNTER — Ambulatory Visit (INDEPENDENT_AMBULATORY_CARE_PROVIDER_SITE_OTHER): Payer: 59 | Admitting: Internal Medicine

## 2021-03-25 ENCOUNTER — Encounter: Payer: Self-pay | Admitting: Internal Medicine

## 2021-03-25 ENCOUNTER — Telehealth: Payer: Self-pay | Admitting: Internal Medicine

## 2021-03-25 DIAGNOSIS — R739 Hyperglycemia, unspecified: Secondary | ICD-10-CM

## 2021-03-25 DIAGNOSIS — I10 Essential (primary) hypertension: Secondary | ICD-10-CM

## 2021-03-25 DIAGNOSIS — I2583 Coronary atherosclerosis due to lipid rich plaque: Secondary | ICD-10-CM

## 2021-03-25 DIAGNOSIS — C670 Malignant neoplasm of trigone of bladder: Secondary | ICD-10-CM | POA: Diagnosis not present

## 2021-03-25 DIAGNOSIS — E291 Testicular hypofunction: Secondary | ICD-10-CM

## 2021-03-25 DIAGNOSIS — E785 Hyperlipidemia, unspecified: Secondary | ICD-10-CM

## 2021-03-25 DIAGNOSIS — E559 Vitamin D deficiency, unspecified: Secondary | ICD-10-CM

## 2021-03-25 DIAGNOSIS — I2510 Atherosclerotic heart disease of native coronary artery without angina pectoris: Secondary | ICD-10-CM

## 2021-03-25 DIAGNOSIS — Z6839 Body mass index (BMI) 39.0-39.9, adult: Secondary | ICD-10-CM

## 2021-03-25 DIAGNOSIS — R413 Other amnesia: Secondary | ICD-10-CM

## 2021-03-25 DIAGNOSIS — R5383 Other fatigue: Secondary | ICD-10-CM

## 2021-03-25 DIAGNOSIS — R635 Abnormal weight gain: Secondary | ICD-10-CM

## 2021-03-25 MED ORDER — TESTOSTERONE CYPIONATE 200 MG/ML IM SOLN
200.0000 mg | INTRAMUSCULAR | 5 refills | Status: DC
Start: 2021-03-25 — End: 2022-01-04

## 2021-03-25 MED ORDER — INDOMETHACIN 50 MG PO CAPS: ORAL_CAPSULE | ORAL | 1 refills | Status: DC

## 2021-03-25 MED ORDER — COLCHICINE 0.6 MG PO CAPS: ORAL_CAPSULE | ORAL | 1 refills | Status: DC

## 2021-03-25 MED ORDER — CLONAZEPAM 1 MG PO TABS: ORAL_TABLET | ORAL | 1 refills | Status: DC

## 2021-03-25 MED ORDER — TIRZEPATIDE 2.5 MG/0.5ML ~~LOC~~ SOAJ: 2.5000 mg | SUBCUTANEOUS | 3 refills | Status: DC

## 2021-03-25 MED ORDER — ALBUTEROL SULFATE HFA 108 (90 BASE) MCG/ACT IN AERS: 2.0000 | INHALATION_SPRAY | Freq: Four times a day (QID) | RESPIRATORY_TRACT | 5 refills | Status: DC | PRN

## 2021-03-25 MED ORDER — HYDROCODONE-ACETAMINOPHEN 7.5-325 MG PO TABS: 0.5000 | ORAL_TABLET | Freq: Four times a day (QID) | ORAL | 0 refills | Status: DC | PRN

## 2021-03-25 NOTE — Progress Notes (Signed)
Subjective:  Patient ID: Christian [name], male    DOB: 1961/04/22  Age: 60 y.o. MRN: [id number]  CC: Annual Exam   HPI Christian [name] presents for HTN, memory issues, HTN, pre-DM C/o wt gain   Outpatient Medications Prior to Visit  Medication Sig Dispense Refill   acetaminophen (TYLENOL) 500 MG tablet Take 1,000 mg by mouth daily.     amLODipine (NORVASC) 5 MG tablet TAKE 1 TABLET BY MOUTH EVERY DAY 90 tablet 3   ascorbic acid (VITAMIN C) 500 MG tablet Take 500 mg by mouth daily.     atorvastatin (LIPITOR) 20 MG tablet TAKE 1 TABLET BY MOUTH EVERY DAY 90 tablet 3   b complex vitamins capsule Take 1 capsule by mouth once a week.     Cholecalciferol (VITAMIN D3) 2000 units capsule Take 1 capsule (2,000 Units total) by mouth daily. 100 capsule 3   fenofibrate (TRICOR) 145 MG tablet TAKE 1 TABLET BY MOUTH EVERY DAY (Patient taking differently: Take 145 mg by mouth daily.) 90 tablet 3   irbesartan (AVAPRO) 300 MG tablet TAKE 1 TABLET BY MOUTH EVERY DAY 90 tablet 3   umeclidinium-vilanterol (ANORO ELLIPTA) 62.5-25 MCG/INH AEPB Inhale 1 puff into the lungs daily. 60 each 2   zinc gluconate 50 MG tablet Take 50 mg by mouth daily.     clonazePAM (KLONOPIN) 1 MG tablet TAKE 1 TABLET BY MOUTH TWICE A DAY AS NEEDED FOR ANXIETY 60 tablet 1   Colchicine 0.6 MG CAPS 1 po qd prn gout attack (Patient taking differently: Take 0.6 mg by mouth daily as needed (gout). 1 po qd prn gout attack) 90 capsule 3   HYDROcodone-acetaminophen (NORCO) 7.5-325 MG tablet Take 0.5-1 tablets by mouth every 6 (six) hours as needed for moderate pain or severe pain. 60 tablet 0   indomethacin (INDOCIN) 50 MG capsule TAKE 1 CAPSULE 3 TIMES DAILY AS NEEDED FOR MODERATE PAIN. OFFICE VISIT NEEDED BEFORE REFILLS 30 capsule 1   No facility-administered medications prior to visit.    ROS: Review of Systems  Constitutional:  Positive for unexpected weight change. Negative for appetite change and fatigue.  HENT:  Negative for  congestion, nosebleeds, sneezing, sore throat and trouble swallowing.   Eyes:  Negative for itching and visual disturbance.  Respiratory:  Negative for cough.   Cardiovascular:  Negative for chest pain, palpitations and leg swelling.  Gastrointestinal:  Negative for abdominal distention, blood in stool, diarrhea and nausea.  Genitourinary:  Negative for frequency and hematuria.  Musculoskeletal:  Negative for back pain, gait problem, joint swelling and neck pain.  Skin:  Negative for rash.  Neurological:  Negative for dizziness, tremors, speech difficulty and weakness.  Psychiatric/Behavioral:  Positive for sleep disturbance. Negative for agitation, dysphoric mood and suicidal ideas. The patient is nervous/anxious.    Objective:  BP (!) 148/82 (BP Location: Left Arm)   Pulse 67   Temp 98 F (36.7 C) (Oral)   Ht 5\' 8"  (1.727 m)   Wt 273 lb 6.4 oz (124 kg)   SpO2 96%   BMI 41.57 kg/m   BP Readings from Last 3 Encounters:  03/25/21 (!) 148/82  06/18/20 128/72  04/22/20 131/82    Wt Readings from Last 3 Encounters:  03/25/21 273 lb 6.4 oz (124 kg)  06/18/20 264 lb 6.4 oz (119.9 kg)  04/22/20 255 lb (115.7 kg)    Physical Exam Constitutional:      General: He is not in acute distress.    Appearance:  He is well-developed. He is obese.     Comments: NAD  Eyes:     Conjunctiva/sclera: Conjunctivae normal.     Pupils: Pupils are equal, round, and reactive to light.  Neck:     Thyroid: No thyromegaly.     Vascular: No JVD.  Cardiovascular:     Rate and Rhythm: Normal rate and regular rhythm.     Heart sounds: Normal heart sounds. No murmur heard.   No friction rub. No gallop.  Pulmonary:     Effort: Pulmonary effort is normal. No respiratory distress.     Breath sounds: Normal breath sounds. No wheezing or rales.  Chest:     Chest wall: No tenderness.  Abdominal:     General: Bowel sounds are normal. There is no distension.     Palpations: Abdomen is soft. There is no  mass.     Tenderness: There is no abdominal tenderness. There is no guarding or rebound.  Musculoskeletal:        General: No tenderness. Normal range of motion.     Cervical back: Normal range of motion.  Lymphadenopathy:     Cervical: No cervical adenopathy.  Skin:    General: Skin is warm and dry.     Findings: No rash.  Neurological:     Mental Status: He is alert and oriented to person, place, and time.     Cranial Nerves: No cranial nerve deficit.     Motor: No abnormal muscle tone.     Coordination: Coordination normal.     Gait: Gait normal.     Deep Tendon Reflexes: Reflexes are normal and symmetric.  Psychiatric:        Behavior: Behavior normal.        Thought Content: Thought content normal.        Judgment: Judgment normal.    Lab Results  Component Value Date   WBC 9.9 03/22/2021   HGB 14.3 03/22/2021   HCT 42.2 03/22/2021   PLT 200.0 03/22/2021   GLUCOSE 110 (H) 03/22/2021   CHOL 260 (H) 03/22/2021   TRIG 263.0 (H) 03/22/2021   HDL 41.30 03/22/2021   LDLDIRECT 191.0 03/22/2021   LDLCALC 96 02/10/2020   ALT 54 (H) 03/22/2021   AST 40 (H) 03/22/2021   NA 139 03/22/2021   K 4.5 03/22/2021   CL 101 03/22/2021   CREATININE 0.91 03/22/2021   BUN 18 03/22/2021   CO2 28 03/22/2021   TSH 2.39 03/22/2021   PSA 0.52 03/22/2021   INR 1.01 03/28/2012   HGBA1C 6.2 06/18/2020    CT Chest Wo Contrast  Result Date: 05/14/2020 CLINICAL DATA:  Pneumonia, effusion or abscess suspected, xray done Follow-up right-sided pneumonia. EXAM: CT CHEST WITHOUT CONTRAST TECHNIQUE: Multidetector CT imaging of the chest was performed following the standard protocol without IV contrast. COMPARISON:  Most recent radiograph 02/13/2020. Most recent CT 02/10/2020 FINDINGS: Cardiovascular: Cardiac motion artifact through the ascending aorta. No aortic aneurysm. There is mild aortic atherosclerosis. Upper normal heart size. There are coronary artery calcifications. No pericardial effusion.  Mediastinum/Nodes: Scattered small mediastinal lymph nodes, decreased in size from prior exam. Limited assessment for hilar adenopathy in the absence of IV contrast. There is persistent and increasing distal esophageal wall thickening with a small hiatal hernia. No visualized thyroid nodule. Lungs/Pleura: Resolution of the previous right upper and lower lobe airspace disease. There is trace subsegmental scarring in the right lower lobe. No evidence of underlying malignancy. Subsegmental atelectasis in the left lower lobe. There  is a calcified granuloma in the right lower lobe trachea and central bronchi are patent. There is no pleural fluid. Upper Abdomen: No acute or unexpected findings. Musculoskeletal: Remote right rib fractures. Multilevel thoracic spondylosis. There are no acute or suspicious osseous abnormalities. Stable sclerotic density within left seventh rib, likely bone island. IMPRESSION: 1. Resolution of previous right upper and lower lobe pneumonia. Minimal subsegmental scarring in the right lower lobe. No evidence of underlying malignancy. 2. Persistent and increased increasing distal esophageal wall thickening with a small hiatal hernia, can be seen with reflux or esophagitis. In the absence of esophagitis symptoms, endoscopy could be considered to exclude underlying mass. 3. Coronary artery calcifications. Aortic Atherosclerosis (ICD10-I70.0). Electronically Signed   By: Keith Rake M.D.   On: 05/14/2020 21:44    Assessment & Plan:   Problem List Items Addressed This Visit     Bladder cancer [employer] [facility])    F/u w/Dr Alyson Ingles       Relevant Medications   Colchicine (MITIGARE) 0.6 MG CAPS   Coronary artery disease    Cont on Lipitor, ASA 81      Dyslipidemia    Cont on Lipitor      HTN (hypertension)    Cont on Norvasc and Irbesartan      Hyperglycemia    Start Mounjaro      Hypogonadism male    Options discussed Start on shots  Potential benefits of a long term sex  steroid  use as well as potential risks  and complications were explained to the patient and were aknowledged - OSA, MI etc.        Memory changes    On Lion's mane      Obesity    Stopped. BMI 42. Start Mounjaro      Relevant Medications   tirzepatide [facility]) 2.5 MG/0.5ML Pen   Other fatigue    R/o OSA- refused a w/up - unable to use a CPAP      Vitamin D deficiency    On Vit D      Weight gain    Worse Discussed         Meds ordered this encounter  Medications   tirzepatide (MOUNJARO) 2.5 MG/0.5ML Pen    Sig: Inject 2.5 mg into the skin once a week.    Dispense:  2 mL    Refill:  3   testosterone cypionate (DEPOTESTOSTERONE CYPIONATE) 200 MG/ML injection    Sig: Inject 1 mL (200 mg total) into the muscle every 14 (fourteen) days.    Dispense:  10 mL    Refill:  5   clonazePAM (KLONOPIN) 1 MG tablet    Sig: TAKE 1 TABLET BY MOUTH TWICE A DAY AS NEEDED FOR ANXIETY    Dispense:  60 tablet    Refill:  1    This request is for a new prescription for a controlled substance as required by Federal/State law..   indomethacin (INDOCIN) 50 MG capsule    Sig: TAKE 1 CAPSULE 3 TIMES DAILY AS NEEDED FOR MODERATE PAIN. OFFICE VISIT NEEDED BEFORE REFILLS    Dispense:  30 capsule    Refill:  1   HYDROcodone-acetaminophen (NORCO) 7.5-325 MG tablet    Sig: Take 0.5-1 tablets by mouth every 6 (six) hours as needed for moderate pain or severe pain.    Dispense:  60 tablet    Refill:  0   albuterol (VENTOLIN HFA) 108 (90 Base) MCG/ACT inhaler    Sig: Inhale 2 puffs into  the lungs every 6 (six) hours as needed for wheezing or shortness of breath.    Dispense:  8 g    Refill:  5   Colchicine (MITIGARE) 0.6 MG CAPS    Sig: Use one bid prn gout attack    Dispense:  30 capsule    Refill:  1     3w Follow-up: Return in about 3 months (around 06/25/2021) for a follow-up visit.  Walker Kehr, MD

## 2021-03-25 NOTE — Assessment & Plan Note (Signed)
Options discussed Start on shots  Potential benefits of a long term sex steroid  use as well as potential risks  and complications were explained to the patient and were aknowledged - OSA, MI etc.

## 2021-03-25 NOTE — Assessment & Plan Note (Signed)
Start Mounjaro

## 2021-03-25 NOTE — Assessment & Plan Note (Signed)
Worse  Discussed  

## 2021-03-25 NOTE — Assessment & Plan Note (Signed)
On Vit D 

## 2021-03-25 NOTE — Assessment & Plan Note (Signed)
Stopped. BMI 42. Start [employer]

## 2021-03-25 NOTE — Assessment & Plan Note (Signed)
F/u w/Dr Alyson Ingles

## 2021-03-25 NOTE — Assessment & Plan Note (Signed)
Cont on Lipitor, ASA 81 

## 2021-03-25 NOTE — Telephone Encounter (Signed)
Patient's spouse states pharmacy is requesting a prior auth  for  tirzepatide [facility]) 2.5 MG/0.5ML Pen  in order for insurance to cover cost

## 2021-03-25 NOTE — Assessment & Plan Note (Signed)
Cont on Norvasc and Irbesartan

## 2021-03-25 NOTE — Assessment & Plan Note (Signed)
R/o OSA- refused a w/up - unable to use a CPAP

## 2021-03-25 NOTE — Assessment & Plan Note (Signed)
On Lion's mane

## 2021-03-25 NOTE — Assessment & Plan Note (Signed)
Cont on Lipitor 

## 2021-03-26 NOTE — Telephone Encounter (Signed)
Submitted PA via cover-my-meds w/ (Key: [id number]). Waiting on insurance determination.Marland KitchenJohny Chess

## 2021-03-29 NOTE — Telephone Encounter (Signed)
Rec'd PA determination bck it states " This request has received an Unfavorable outcome. The request for coverage for [facility] INJ 2.5/0.5, use as directed (44ml per month), is denied. This decision is based on health plan criteria for [facility] INJ 2.5/0.5. This medicine is covered only if: All of the following: (1) You have a diagnosis of type 2 diabetes mellitus confirmed by accepted laboratory testing methodologies per treatment guidelines (for example, A1C greater than or equal to 6.5%, fasting plasma glucose greater than or equal to 126mg /dL and/or 2-hour plasma glucose greater than or equal to 200mg /dL). (2) You have suboptimal response (after a three-month trial), contraindication or intolerance to metformin (generic Glucophage, Glucophage XR)

## 2021-03-29 NOTE — Telephone Encounter (Signed)
Please inform the patient that Christian [name] was denied by his health insurance company.  Thanks

## 2021-04-01 ENCOUNTER — Other Ambulatory Visit: Payer: Self-pay | Admitting: Internal Medicine

## 2021-04-01 DIAGNOSIS — E7849 Other hyperlipidemia: Secondary | ICD-10-CM

## 2021-04-02 NOTE — Telephone Encounter (Signed)
Patient spouse Lyndee Leo states she will be faxing over appeal forms for rx Christian [name] that was denied by insurance company  Please call patient when forms are ready for pick up

## 2021-04-02 NOTE — Telephone Encounter (Signed)
Noted../lmb 

## 2021-04-16 ENCOUNTER — Other Ambulatory Visit: Payer: Self-pay | Admitting: Internal Medicine

## 2021-04-28 ENCOUNTER — Ambulatory Visit (INDEPENDENT_AMBULATORY_CARE_PROVIDER_SITE_OTHER): Payer: 59 | Admitting: Urology

## 2021-04-28 ENCOUNTER — Other Ambulatory Visit: Payer: Self-pay

## 2021-04-28 ENCOUNTER — Encounter: Payer: Self-pay | Admitting: Urology

## 2021-04-28 VITALS — BP 146/87 | HR 80

## 2021-04-28 DIAGNOSIS — C670 Malignant neoplasm of trigone of bladder: Secondary | ICD-10-CM | POA: Diagnosis not present

## 2021-04-28 DIAGNOSIS — C672 Malignant neoplasm of lateral wall of bladder: Secondary | ICD-10-CM

## 2021-04-28 DIAGNOSIS — D494 Neoplasm of unspecified behavior of bladder: Secondary | ICD-10-CM

## 2021-04-28 LAB — URINALYSIS, ROUTINE W REFLEX MICROSCOPIC
Bilirubin, UA: NEGATIVE
Glucose, UA: NEGATIVE
Ketones, UA: NEGATIVE
Leukocytes,UA: NEGATIVE
Nitrite, UA: NEGATIVE
Protein,UA: NEGATIVE
RBC, UA: NEGATIVE
Specific Gravity, UA: 1.025 (ref 1.005–1.030)
Urobilinogen, Ur: 0.2 mg/dL (ref 0.2–1.0)
pH, UA: 5.5 (ref 5.0–7.5)

## 2021-04-28 MED ORDER — CIPROFLOXACIN HCL 500 MG PO TABS
500.0000 mg | ORAL_TABLET | Freq: Once | ORAL | Status: AC
Start: 2021-04-28 — End: 2021-04-28
  Administered 2021-04-28: 15:00:00 500 mg via ORAL

## 2021-04-28 NOTE — H&P (View-Only) (Signed)
° °  04/28/21  CC: followup bladder cancer  HPI: Christian [name] is a 60yo here for followup for bladder cancer  There were no vitals taken for this visit. NED. A&Ox3.   No respiratory distress   Abd soft, NT, ND Normal phallus with bilateral descended testicles  Cystoscopy Procedure Note  Patient identification was confirmed, informed consent was obtained, and patient was prepped using Betadine solution.  Lidocaine jelly was administered per urethral meatus.     Pre-Procedure: - Inspection reveals a normal caliber ureteral meatus.  Procedure: The flexible cystoscope was introduced without difficulty - No urethral strictures/lesions are present. - Enlarged prostate  - Normal bladder neck - Bilateral ureteral orifices identified - Bladder mucosa  reveals 23mm left dome papillary tumor, stable - No bladder stones - No trabeculation     Post-Procedure: - Patient tolerated the procedure well  Assessment/ Plan: -We discussed resection versus surveillance and the patient wishes to bladder tumor resection. Risks/benefits/alternatives discussed  No follow-ups on file.  [name], MD

## 2021-04-28 NOTE — Patient Instructions (Signed)
Bladder Cancer Bladder cancer is a condition in which abnormal tissue (a tumor) grows in the bladder. The bladder is the organ that holds urine. Two tubes (ureters) carry the urine from the kidneys to the bladder. The bladder wall is made of layers of tissue. Cancer that spreads through these layers of the bladder wall becomes more difficult to treat. What are the causes? The cause of this condition is not known. What increases the risk? The following factors may make you more likely to develop this condition: Smoking. Working where there are risks (occupational exposures), such as working with rubber, leather, clothing fabric, dyes, chemicals, and paint. Being 55 years of age or older. Being male. Having bladder inflammation that is long-term (chronic). Having a history of cancer, including: A family history of bladder cancer. Personal experience with bladder cancer. Having had certain treatments for cancer before. These include: Medicines to kill cancer cells (chemotherapy). Strong X-ray beams or capsules high in energy to kill cancer cells and shrink tumors (radiation therapy). Having been exposed to arsenic. This is a chemical element that can poison you. What are the signs or symptoms? Early symptoms of this condition include: Seeing blood in your urine. Feeling pain when urinating. Having infections of your urinary system (urinary tract infections or UTIs) that happen often. Having to urinate sooner or more often than usual. Later symptoms of this condition include: Not being able to urinate. Pain on one side of your lower back. Loss of appetite. Weight loss. Tiredness (fatigue). Swelling in your feet. Bone pain. How is this diagnosed? This condition is diagnosed based on: Your medical history. A physical exam. Lab tests, such as urine tests. Imaging tests. Your symptoms. You may also have other tests or procedures done, such as: A cystoscopy. A narrow tube is inserted  into your bladder through the organ that connects your bladder to the outside of your body (urethra). This is done to view the lining of your bladder for tumors. A biopsy. This procedure involves removing a tissue sample to look at it under a microscope to see if cancer is present. It is important to find out: How deeply into the bladder wall cancer has grown. Whether cancer has spread to any other parts of your body. This may require blood tests or imaging tests, such as a CT scan, MRI, bone scan, or X-rays. How is this treated? Your health care provider may recommend one or more types of treatment based on the stage of your cancer. The most common types of treatment are: Surgery to remove the cancer. Procedures that may be done include: Removing a tumor on the inside wall of the bladder (transurethral resection). Removing the bladder (cystectomy). Radiation therapy. This is often used together with chemotherapy. Chemotherapy. Immunotherapy. This uses medicines to help your immune system destroy cancer cells. Follow these instructions at home: Take over-the-counter and prescription medicines only as told by your health care provider. Eat a healthy diet. Some of your treatments might affect your appetite. Do not use any products that contain nicotine or tobacco, such as cigarettes, e-cigarettes, and chewing tobacco. If you need help quitting, ask your health care provider. Consider joining a support group. This may help you learn to cope with the stress of having bladder cancer. Tell your cancer care team if you develop side effects. Your team may be able to recommend ways to get relief. Keep all follow-up visits as told by your health care provider. This is important. Where to find more information American   Cancer Society: www.cancer.org National Cancer Institute (NCI): www.cancer.gov Contact a health care provider if: You have symptoms of a urinary tract infection. These  include: Fever. Chills. Weakness. Muscle aches. Pain in your abdomen. Urge to urinate that is stronger and happens more often than usual. Burning feeling in the bladder or urethra when you urinate. Get help right away if: There is blood in your urine. You cannot urinate. You have severe pain or other symptoms that do not go away. Summary Bladder cancer is a condition in which tumors grow in the bladder and cause illness. This condition is diagnosed based on your medical history, a physical exam, lab tests, imaging tests, and your symptoms. Your health care provider may recommend one or more types of treatment based on the stage of your cancer. Consider joining a support group. This may help you learn to cope with the stress of having bladder cancer. This information is not intended to replace advice given to you by your health care provider. Make sure you discuss any questions you have with your health care provider. Document Revised: 01/09/2019 Document Reviewed: 01/09/2019 Elsevier Patient Education  2022 [employer] Inc.  

## 2021-04-28 NOTE — Progress Notes (Addendum)
° °  04/28/21  CC: followup bladder cancer  HPI: Mr Snedden is a 60yo here for followup for bladder cancer  There were no vitals taken for this visit. NED. A&Ox3.   No respiratory distress   Abd soft, NT, ND Normal phallus with bilateral descended testicles  Cystoscopy Procedure Note  Patient identification was confirmed, informed consent was obtained, and patient was prepped using Betadine solution.  Lidocaine jelly was administered per urethral meatus.     Pre-Procedure: - Inspection reveals a normal caliber ureteral meatus.  Procedure: The flexible cystoscope was introduced without difficulty - No urethral strictures/lesions are present. - Enlarged prostate  - Normal bladder neck - Bilateral ureteral orifices identified - Bladder mucosa  reveals 56mm left dome papillary tumor, stable - No bladder stones - No trabeculation     Post-Procedure: - Patient tolerated the procedure well  Assessment/ Plan: -We discussed resection versus surveillance and the patient wishes to bladder tumor resection. Risks/benefits/alternatives discussed  No follow-ups on file.  [name], MD

## 2021-04-28 NOTE — Progress Notes (Signed)

## 2021-04-29 ENCOUNTER — Telehealth: Payer: Self-pay

## 2021-04-29 NOTE — Telephone Encounter (Signed)
Patients wife called and wanted to see if you could call her today or tomorrow. She wanted to discuss the options for removing that place that you and patient discussed yesterday. She just wanted to know what exactly you thought was best so that they could get something scheduled. The patient is now thinking since talking with his wife and you that the procedure to remove the spot is something they are considering.      I have listed his wifes Contact below  [phone]

## 2021-04-30 ENCOUNTER — Other Ambulatory Visit: Payer: Self-pay | Admitting: Urology

## 2021-04-30 DIAGNOSIS — D494 Neoplasm of unspecified behavior of bladder: Secondary | ICD-10-CM

## 2021-04-30 NOTE — Progress Notes (Signed)
Surgical Physician Order Form [facility]  * Scheduling expectation :  08/11/2020  *Length of Case: 30 minutes  *MD Preforming Case: [name], MD  *Assistant Needed: no  *Facility Preference: [facility]  *Clearance needed: no  *Anticoagulation Instructions: N/A  *Aspirin Instructions: N/A  -Admit type: OUTpatient  -Anesthesia: General  -Use Standing Orders:  NA  *Diagnosis: Bladder Tumor  *Procedure:     TURBT <2cm ([id number])  Additional orders: Gemcitabine 2000mg  bladder instillation  -Equipment: NA -VTE Prophylaxis Standing Order SCDs       Other:   -Standing Lab Orders Per Anesthesia    Lab other: None  -Standing Test orders EKG/Chest x-ray per Anesthesia       Test other:   - Medications:  Ancef 2gm IV  -Other orders:  PAS  *Post-op visit Date/Instructions:  1 week voiding trial

## 2021-05-05 ENCOUNTER — Other Ambulatory Visit: Payer: Self-pay | Admitting: Internal Medicine

## 2021-05-07 NOTE — Telephone Encounter (Signed)
Forms for Christian [name] was faxed back to [email or web address] KitchenJohny Chess

## 2021-05-10 NOTE — Progress Notes (Signed)
[facility] Posting Form   Surgery Date/Time: Date: 05/13/2021  Surgeon: Dr. [name], MD  Surgery Location: Day Surgery  Inpt ( No  )   Outpt (Yes)   Obs ( No  )   Diagnosis: D49.4 Bladder Tumor  -CPT: 97471  Surgery: TURBT with instillation of Gemcitabine  Stop Anticoagulations: N/A  Cardiac/Medical/Pulmonary Clearance needed: no  *Orders entered into EPIC  Date: 05/10/21   *Case booked in [place]  Date: 05/06/2021  *Notified pt of Surgery: Date: 05/06/2021  PRE-OP UA & CX: no  *Placed into Prior Authorization Work [employer] Date: 05/10/21   Assistant/laser/rep:No

## 2021-05-11 MED ORDER — GEMCITABINE CHEMO FOR BLADDER INSTILLATION 2000 MG
2000.0000 mg | Freq: Once | INTRAVENOUS | Status: DC
  Filled 2021-05-11: qty 52.6

## 2021-05-11 NOTE — Patient Instructions (Signed)
Christian [name]  05/11/2021     @PREFPERIOPPHARMACY @   Your procedure is scheduled on  05/13/2021.   Report to [facility] at  1200  P.M.   Call this number if you have problems the morning of surgery:  [phone]   Remember:  Do not eat or drink after midnight.    Use your inhaler before you come and bring your rescue inhaler with you.      Take these medicines the morning of surgery with A SIP OF WATER          amlodipine, hydrocodone or indocin.     Do not wear jewelry, make-up or nail polish.  Do not wear lotions, powders, or perfumes, or deodorant.  Do not shave 48 hours prior to surgery.  Men may shave face and neck.  Do not bring valuables to the hospital.  [facility] is not responsible for any belongings or valuables.  Contacts, dentures or bridgework may not be worn into surgery.  Leave your suitcase in the car.  After surgery it may be brought to your room.  For patients admitted to the hospital, discharge time will be determined by your treatment team.  Patients discharged the day of surgery will not be allowed to drive home and must have someone with them for 24 hours.    Special instructions:   DO NOT smoke tobacco or vape for 24 hours before your procedure.  Please read over the following fact sheets that you were given. Anesthesia Post-op Instructions and Care and Recovery After Surgery      Transurethral Resection of Bladder Tumor, Care After This sheet gives you information about how to care for yourself after your procedure. Your health care provider may also give you more specific instructions. If you have problems or questions, contact your health care provider. What can I expect after the procedure? After the procedure, it is common to have: A small amount of blood in your urine for up to 2 weeks. Soreness or mild pain from your catheter. After your catheter is removed, you may have mild soreness, especially when urinating. Pain  in your lower abdomen. Follow these instructions at home: Medicines  Take over-the-counter and prescription medicines only as told by your health care provider. If you were prescribed an antibiotic medicine, take it as told by your health care provider. Do not stop taking the antibiotic even if you start to feel better. Do not drive for 24 hours if you were given a sedative during your procedure. Ask your health care provider if the medicine prescribed to you: Requires you to avoid driving or using heavy machinery. Can cause constipation. You may need to take these actions to prevent or treat constipation: Take over-the-counter or prescription medicines. Eat foods that are high in fiber, such as beans, whole grains, and fresh fruits and vegetables. Limit foods that are high in fat and processed sugars, such as fried or sweet foods. Activity Return to your normal activities as told by your health care provider. Ask your health care provider what activities are safe for you. Do not lift anything that is heavier than 10 lb (4.5 kg), or the limit that you are told, until your health care provider says that it is safe. Avoid intense physical activity for as long as told by your health care provider. Rest as told by your health care provider. Avoid sitting for a long time without moving. Get up to take  short walks every 1-2 hours. This is important to improve blood flow and breathing. Ask for help if you feel weak or unsteady. General instructions  Do not drink alcohol for as long as told by your health care provider. This is especially important if you are taking prescription pain medicines. Do not take baths, swim, or use a hot tub until your health care provider approves. Ask your health care provider if you may take showers. You may only be allowed to take sponge baths. If you have a catheter, follow instructions from your health care provider about caring for your catheter and your drainage  bag. Drink enough fluid to keep your urine pale yellow. Wear compression stockings as told by your health care provider. These stockings help to prevent blood clots and reduce swelling in your legs. Keep all follow-up visits as told by your health care provider. This is important. You will need to be followed closely with regular checks of your bladder and urethra (cystoscopies) to make sure that the cancer does not come back. Contact a health care provider if: You have pain that gets worse or does not improve with medicine. You have blood in your urine for more than 2 weeks. You have cloudy or bad-smelling urine. You become constipated. Signs of constipation may include having: Fewer than three bowel movements in a week. Difficulty having a bowel movement. Stools that are dry, hard, or larger than normal. You have a fever. Get help right away if: You have: Severe pain. Bright red blood in your urine. Blood clots in your urine. A lot of blood in your urine. Your catheter has been removed and you are not able to urinate. You have a catheter in place and the catheter is not draining urine. Summary After your procedure, it is common to have a small amount of blood in your urine, soreness or mild pain from your catheter, and pain in your lower abdomen. Take over-the-counter and prescription medicines only as told by your health care provider. Rest as told by your health care provider. Follow your health care provider's instructions about returning to normal activities. Ask what activities are safe for you. If you have a catheter, follow instructions from your health care provider about caring for your catheter and your drainage bag. Get help right away if you cannot urinate, you have severe pain, or you have bright red blood or blood clots in your urine. This information is not intended to replace advice given to you by your health care provider. Make sure you discuss any questions you have  with your health care provider. Document Revised: 11/30/2017 Document Reviewed: 11/30/2017 Elsevier Patient Education  [place] Anesthesia, Adult, Care After This sheet gives you information about how to care for yourself after your procedure. Your health care provider may also give you more specific instructions. If you have problems or questions, contact your health care provider. What can I expect after the procedure? After the procedure, the following side effects are common: Pain or discomfort at the IV site. Nausea. Vomiting. Sore throat. Trouble concentrating. Feeling cold or chills. Feeling weak or tired. Sleepiness and fatigue. Soreness and body aches. These side effects can affect parts of the body that were not involved in surgery. Follow these instructions at home: For the time period you were told by your health care provider:  Rest. Do not participate in activities where you could fall or become injured. Do not drive or use machinery. Do not drink alcohol.  Do not take sleeping pills or medicines that cause drowsiness. Do not make important decisions or sign legal documents. Do not take care of children on your own. Eating and drinking Follow any instructions from your health care provider about eating or drinking restrictions. When you feel hungry, start by eating small amounts of foods that are soft and easy to digest (bland), such as toast. Gradually return to your regular diet. Drink enough fluid to keep your urine pale yellow. If you vomit, rehydrate by drinking water, juice, or clear broth. General instructions If you have sleep apnea, surgery and certain medicines can increase your risk for breathing problems. Follow instructions from your health care provider about wearing your sleep device: Anytime you are sleeping, including during daytime naps. While taking prescription pain medicines, sleeping medicines, or medicines that make you  drowsy. Have a responsible adult stay with you for the time you are told. It is important to have someone help care for you until you are awake and alert. Return to your normal activities as told by your health care provider. Ask your health care provider what activities are safe for you. Take over-the-counter and prescription medicines only as told by your health care provider. If you smoke, do not smoke without supervision. Keep all follow-up visits as told by your health care provider. This is important. Contact a health care provider if: You have nausea or vomiting that does not get better with medicine. You cannot eat or drink without vomiting. You have pain that does not get better with medicine. You are unable to pass urine. You develop a skin rash. You have a fever. You have redness around your IV site that gets worse. Get help right away if: You have difficulty breathing. You have chest pain. You have blood in your urine or stool, or you vomit blood. Summary After the procedure, it is common to have a sore throat or nausea. It is also common to feel tired. Have a responsible adult stay with you for the time you are told. It is important to have someone help care for you until you are awake and alert. When you feel hungry, start by eating small amounts of foods that are soft and easy to digest (bland), such as toast. Gradually return to your regular diet. Drink enough fluid to keep your urine pale yellow. Return to your normal activities as told by your health care provider. Ask your health care provider what activities are safe for you. This information is not intended to replace advice given to you by your health care provider. Make sure you discuss any questions you have with your health care provider. Document Revised: 01/16/2020 Document Reviewed: 08/15/2019 Elsevier Patient Education  2022 [place]. How to Use Chlorhexidine for Bathing Chlorhexidine gluconate (CHG) is a  germ-killing (antiseptic) solution that is used to clean the skin. It can get rid of the bacteria that normally live on the skin and can keep them away for about 24 hours. To clean your skin with CHG, you may be given: A CHG solution to use in the shower or as part of a sponge bath. A prepackaged cloth that contains CHG. Cleaning your skin with CHG may help lower the risk for infection: While you are staying in the intensive care unit of the hospital. If you have a vascular access, such as a central line, to provide short-term or long-term access to your veins. If you have a catheter to drain urine from your bladder. If you  are on a ventilator. A ventilator is a machine that helps you breathe by moving air in and out of your lungs. After surgery. What are the risks? Risks of using CHG include: A skin reaction. Hearing loss, if CHG gets in your ears and you have a perforated eardrum. Eye injury, if CHG gets in your eyes and is not rinsed out. The CHG product catching fire. Make sure that you avoid smoking and flames after applying CHG to your skin. Do not use CHG: If you have a chlorhexidine allergy or have previously reacted to chlorhexidine. On babies younger than 84 months of age. How to use CHG solution Use CHG only as told by your health care provider, and follow the instructions on the label. Use the full amount of CHG as directed. Usually, this is one bottle. During a shower Follow these steps when using CHG solution during a shower (unless your health care provider gives you different instructions): Start the shower. Use your normal soap and shampoo to wash your face and hair. Turn off the shower or move out of the shower stream. Pour the CHG onto a clean washcloth. Do not use any type of brush or rough-edged sponge. Starting at your neck, lather your body down to your toes. Make sure you follow these instructions: If you will be having surgery, pay special attention to the part of  your body where you will be having surgery. Scrub this area for at least 1 minute. Do not use CHG on your head or face. If the solution gets into your ears or eyes, rinse them well with water. Avoid your genital area. Avoid any areas of skin that have broken skin, cuts, or scrapes. Scrub your back and under your arms. Make sure to wash skin folds. Let the lather sit on your skin for 1-2 minutes or as long as told by your health care provider. Thoroughly rinse your entire body in the shower. Make sure that all body creases and crevices are rinsed well. Dry off with a clean towel. Do not put any substances on your body afterward--such as powder, lotion, or perfume--unless you are told to do so by your health care provider. Only use lotions that are recommended by the manufacturer. Put on clean clothes or pajamas. If it is the night before your surgery, sleep in clean sheets.  During a sponge bath Follow these steps when using CHG solution during a sponge bath (unless your health care provider gives you different instructions): Use your normal soap and shampoo to wash your face and hair. Pour the CHG onto a clean washcloth. Starting at your neck, lather your body down to your toes. Make sure you follow these instructions: If you will be having surgery, pay special attention to the part of your body where you will be having surgery. Scrub this area for at least 1 minute. Do not use CHG on your head or face. If the solution gets into your ears or eyes, rinse them well with water. Avoid your genital area. Avoid any areas of skin that have broken skin, cuts, or scrapes. Scrub your back and under your arms. Make sure to wash skin folds. Let the lather sit on your skin for 1-2 minutes or as long as told by your health care provider. Using a different clean, wet washcloth, thoroughly rinse your entire body. Make sure that all body creases and crevices are rinsed well. Dry off with a clean towel. Do not  put any substances on your  body afterward--such as powder, lotion, or perfume--unless you are told to do so by your health care provider. Only use lotions that are recommended by the manufacturer. Put on clean clothes or pajamas. If it is the night before your surgery, sleep in clean sheets. How to use CHG prepackaged cloths Only use CHG cloths as told by your health care provider, and follow the instructions on the label. Use the CHG cloth on clean, dry skin. Do not use the CHG cloth on your head or face unless your health care provider tells you to. When washing with the CHG cloth: Avoid your genital area. Avoid any areas of skin that have broken skin, cuts, or scrapes. Before surgery Follow these steps when using a CHG cloth to clean before surgery (unless your health care provider gives you different instructions): Using the CHG cloth, vigorously scrub the part of your body where you will be having surgery. Scrub using a back-and-forth motion for 3 minutes. The area on your body should be completely wet with CHG when you are done scrubbing. Do not rinse. Discard the cloth and let the area air-dry. Do not put any substances on the area afterward, such as powder, lotion, or perfume. Put on clean clothes or pajamas. If it is the night before your surgery, sleep in clean sheets.  For general bathing Follow these steps when using CHG cloths for general bathing (unless your health care provider gives you different instructions). Use a separate CHG cloth for each area of your body. Make sure you wash between any folds of skin and between your fingers and toes. Wash your body in the following order, switching to a new cloth after each step: The front of your neck, shoulders, and chest. Both of your arms, under your arms, and your hands. Your stomach and groin area, avoiding the genitals. Your right leg and foot. Your left leg and foot. The back of your neck, your back, and your buttocks. Do not  rinse. Discard the cloth and let the area air-dry. Do not put any substances on your body afterward--such as powder, lotion, or perfume--unless you are told to do so by your health care provider. Only use lotions that are recommended by the manufacturer. Put on clean clothes or pajamas. Contact a health care provider if: Your skin gets irritated after scrubbing. You have questions about using your solution or cloth. You swallow any chlorhexidine. Call your local poison control center (1-(936)226-9813 in the [place] Get help right away if: Your eyes itch badly, or they become very red or swollen. Your skin itches badly and is red or swollen. Your hearing changes. You have trouble seeing. You have swelling or tingling in your mouth or throat. You have trouble breathing. These symptoms may represent a serious problem that is an emergency. Do not wait to see if the symptoms will go away. Get medical help right away. Call your local emergency services (911 in the [place] Do not drive yourself to the hospital. Summary Chlorhexidine gluconate (CHG) is a germ-killing (antiseptic) solution that is used to clean the skin. Cleaning your skin with CHG may help to lower your risk for infection. You may be given CHG to use for bathing. It may be in a bottle or in a prepackaged cloth to use on your skin. Carefully follow your health care provider's instructions and the instructions on the product label. Do not use CHG if you have a chlorhexidine allergy. Contact your health care provider if your skin gets irritated  after scrubbing. This information is not intended to replace advice given to you by your health care provider. Make sure you discuss any questions you have with your health care provider. Document Revised: 07/13/2020 Document Reviewed: 07/13/2020 Elsevier Patient Education  2022 [employer].

## 2021-05-11 NOTE — Pre-Procedure Instructions (Signed)
I spoke with Donna Christen, [facility] and released order for Gemzar to ensure that it will be here for surgery.

## 2021-05-12 ENCOUNTER — Encounter (HOSPITAL_COMMUNITY)
Admission: RE | Admit: 2021-05-12 | Discharge: 2021-05-12 | Disposition: A | Discharge status: Home / Self Care | Payer: 59 | Source: Ambulatory Visit | Attending: Urology | Admitting: Urology

## 2021-05-12 ENCOUNTER — Encounter (HOSPITAL_COMMUNITY): Payer: Self-pay

## 2021-05-12 VITALS — BP 152/88 | HR 85 | Temp 97.7°F | Resp 18 | Ht 69.0 in | Wt 268.0 lb

## 2021-05-12 DIAGNOSIS — R310 Gross hematuria: Secondary | ICD-10-CM | POA: Diagnosis not present

## 2021-05-12 DIAGNOSIS — Z01818 Encounter for other preprocedural examination: Secondary | ICD-10-CM | POA: Insufficient documentation

## 2021-05-12 DIAGNOSIS — D494 Neoplasm of unspecified behavior of bladder: Secondary | ICD-10-CM

## 2021-05-12 LAB — CBC WITH DIFFERENTIAL/PLATELET
Abs Immature Granulocytes: 0.03 10*3/uL (ref 0.00–0.07)
Basophils Absolute: 0.1 10*3/uL (ref 0.0–0.1)
Basophils Relative: 1 %
Eosinophils Absolute: 0.3 10*3/uL (ref 0.0–0.5)
Eosinophils Relative: 4 %
HCT: 43.1 % (ref 39.0–52.0)
Hemoglobin: 14.3 g/dL (ref 13.0–17.0)
Immature Granulocytes: 0 %
Lymphocytes Relative: 19 %
Lymphs Abs: 1.7 10*3/uL (ref 0.7–4.0)
MCH: 32.4 pg (ref 26.0–34.0)
MCHC: 33.2 g/dL (ref 30.0–36.0)
MCV: 97.7 fL (ref 80.0–100.0)
Monocytes Absolute: 0.6 10*3/uL (ref 0.1–1.0)
Monocytes Relative: 7 %
Neutro Abs: 6.2 10*3/uL (ref 1.7–7.7)
Neutrophils Relative %: 69 %
Platelets: 181 10*3/uL (ref 150–400)
RBC: 4.41 MIL/uL (ref 4.22–5.81)
RDW: 13.6 % (ref 11.5–15.5)
WBC: 8.8 10*3/uL (ref 4.0–10.5)
nRBC: 0 % (ref 0.0–0.2)

## 2021-05-12 NOTE — Progress Notes (Signed)
°   05/12/21 0817  OBSTRUCTIVE SLEEP APNEA  Have you ever been diagnosed with sleep apnea through a sleep study? No  Do you snore loudly (loud enough to be heard through closed doors)?  1  Do you often feel tired, fatigued, or sleepy during the daytime (such as falling asleep during driving or talking to someone)? 0  Has anyone observed you stop breathing during your sleep? 0  Do you have, or are you being treated for high blood pressure? 1  BMI more than 35 kg/m2? 1  Age > 50 (1-yes) 1  Neck circumference greater than:Male 16 inches or larger, Male 17inches or larger? 1  Male Gender (Yes=1) 1  Obstructive Sleep Apnea Score 6  Score 5 or greater  Results sent to PCP

## 2021-05-13 ENCOUNTER — Encounter (HOSPITAL_COMMUNITY)
Admission: RE | Disposition: A | Discharge status: Home / Self Care | Payer: Self-pay | Source: Ambulatory Visit | Attending: Urology

## 2021-05-13 ENCOUNTER — Ambulatory Visit (HOSPITAL_COMMUNITY): Payer: 59 | Admitting: Anesthesiology

## 2021-05-13 ENCOUNTER — Encounter (HOSPITAL_COMMUNITY): Payer: Self-pay | Admitting: Urology

## 2021-05-13 ENCOUNTER — Ambulatory Visit (HOSPITAL_COMMUNITY)
Admission: RE | Admit: 2021-05-13 | Discharge: 2021-05-13 | Disposition: A | Discharge status: Home / Self Care | Payer: 59 | Source: Ambulatory Visit | Attending: Urology | Admitting: Urology

## 2021-05-13 DIAGNOSIS — Z87891 Personal history of nicotine dependence: Secondary | ICD-10-CM | POA: Insufficient documentation

## 2021-05-13 DIAGNOSIS — C671 Malignant neoplasm of dome of bladder: Secondary | ICD-10-CM | POA: Diagnosis not present

## 2021-05-13 DIAGNOSIS — C679 Malignant neoplasm of bladder, unspecified: Secondary | ICD-10-CM | POA: Diagnosis present

## 2021-05-13 DIAGNOSIS — D494 Neoplasm of unspecified behavior of bladder: Secondary | ICD-10-CM

## 2021-05-13 DIAGNOSIS — C672 Malignant neoplasm of lateral wall of bladder: Secondary | ICD-10-CM | POA: Insufficient documentation

## 2021-05-13 HISTORY — PX: TRANSURETHRAL RESECTION OF BLADDER TUMOR: SHX2575

## 2021-05-13 LAB — GLUCOSE, CAPILLARY: Glucose-Capillary: 110 mg/dL — ABNORMAL HIGH (ref 70–99)

## 2021-05-13 SURGERY — TURBT (TRANSURETHRAL RESECTION OF BLADDER TUMOR)
Anesthesia: General | Site: Bladder

## 2021-05-13 MED ORDER — DEXAMETHASONE SODIUM PHOSPHATE 10 MG/ML IJ SOLN
INTRAMUSCULAR | Status: DC | PRN
  Administered 2021-05-13: 10 mg via INTRAVENOUS

## 2021-05-13 MED ORDER — LACTATED RINGERS IV SOLN
INTRAVENOUS | Status: DC
  Administered 2021-05-13: 07:00:00 1000 mL via INTRAVENOUS

## 2021-05-13 MED ORDER — LIDOCAINE HCL (CARDIAC) PF 100 MG/5ML IV SOSY
PREFILLED_SYRINGE | INTRAVENOUS | Status: DC | PRN
Start: 2021-05-13 — End: 2021-05-13
  Administered 2021-05-13: 50 mg via INTRAVENOUS

## 2021-05-13 MED ORDER — CEFAZOLIN SODIUM-DEXTROSE 2-4 GM/100ML-% IV SOLN: 2.0000 g | INTRAVENOUS | Status: DC

## 2021-05-13 MED ORDER — MIDAZOLAM HCL 2 MG/2ML IJ SOLN
INTRAMUSCULAR | Status: AC
  Filled 2021-05-13: qty 2

## 2021-05-13 MED ORDER — ONDANSETRON HCL 4 MG/2ML IJ SOLN
INTRAMUSCULAR | Status: DC | PRN
  Administered 2021-05-13: 4 mg via INTRAVENOUS

## 2021-05-13 MED ORDER — PHENYLEPHRINE HCL (PRESSORS) 10 MG/ML IV SOLN
INTRAVENOUS | Status: DC | PRN
  Administered 2021-05-13 (×4): 100 ug via INTRAVENOUS

## 2021-05-13 MED ORDER — SUGAMMADEX SODIUM 500 MG/5ML IV SOLN
INTRAVENOUS | Status: DC | PRN
  Administered 2021-05-13: 500 mg via INTRAVENOUS

## 2021-05-13 MED ORDER — CHLORHEXIDINE GLUCONATE 0.12 % MT SOLN
15.0000 mL | Freq: Once | OROMUCOSAL | Status: AC
  Administered 2021-05-13: 08:00:00 15 mL via OROMUCOSAL

## 2021-05-13 MED ORDER — SODIUM CHLORIDE 0.9 % IR SOLN
Status: DC | PRN
  Administered 2021-05-13 (×2): 3000 mL via INTRAVESICAL

## 2021-05-13 MED ORDER — ONDANSETRON HCL 4 MG/2ML IJ SOLN
INTRAMUSCULAR | Status: AC
  Filled 2021-05-13: qty 2

## 2021-05-13 MED ORDER — FENTANYL CITRATE (PF) 100 MCG/2ML IJ SOLN
INTRAMUSCULAR | Status: DC | PRN
  Administered 2021-05-13: 100 ug via INTRAVENOUS

## 2021-05-13 MED ORDER — FENTANYL CITRATE PF 50 MCG/ML IJ SOSY: 25.0000 ug | PREFILLED_SYRINGE | INTRAMUSCULAR | Status: DC | PRN

## 2021-05-13 MED ORDER — ORAL CARE MOUTH RINSE: 15.0000 mL | Freq: Once | OROMUCOSAL | Status: AC

## 2021-05-13 MED ORDER — GEMCITABINE CHEMO FOR BLADDER INSTILLATION 2000 MG
2000.0000 mg | Freq: Once | INTRAVENOUS | Status: AC
  Administered 2021-05-13: 09:00:00 2000 mg via INTRAVESICAL
  Filled 2021-05-13: qty 52.6

## 2021-05-13 MED ORDER — MIDAZOLAM HCL 5 MG/5ML IJ SOLN
INTRAMUSCULAR | Status: DC | PRN
Start: 2021-05-13 — End: 2021-05-13
  Administered 2021-05-13: 2 mg via INTRAVENOUS

## 2021-05-13 MED ORDER — STERILE WATER FOR IRRIGATION IR SOLN
Status: DC | PRN
  Administered 2021-05-13: 500 mL

## 2021-05-13 MED ORDER — ROCURONIUM BROMIDE 100 MG/10ML IV SOLN
INTRAVENOUS | Status: DC | PRN
  Administered 2021-05-13: 50 mg via INTRAVENOUS

## 2021-05-13 MED ORDER — PROPOFOL 10 MG/ML IV BOLUS
INTRAVENOUS | Status: AC
  Filled 2021-05-13: qty 20

## 2021-05-13 MED ORDER — BELLADONNA ALKALOIDS-OPIUM 16.2-60 MG RE SUPP
RECTAL | Status: AC
  Filled 2021-05-13: qty 1

## 2021-05-13 MED ORDER — HYDROCODONE-ACETAMINOPHEN 7.5-325 MG PO TABS: 0.5000 | ORAL_TABLET | ORAL | 0 refills | Status: DC | PRN

## 2021-05-13 MED ORDER — ROCURONIUM BROMIDE 10 MG/ML (PF) SYRINGE
PREFILLED_SYRINGE | INTRAVENOUS | Status: AC
  Filled 2021-05-13: qty 10

## 2021-05-13 MED ORDER — DEXAMETHASONE SODIUM PHOSPHATE 10 MG/ML IJ SOLN
INTRAMUSCULAR | Status: AC
  Filled 2021-05-13: qty 1

## 2021-05-13 MED ORDER — PROPOFOL 10 MG/ML IV BOLUS
INTRAVENOUS | Status: DC | PRN
  Administered 2021-05-13: 200 mg via INTRAVENOUS

## 2021-05-13 MED ORDER — FENTANYL CITRATE (PF) 250 MCG/5ML IJ SOLN
INTRAMUSCULAR | Status: AC
  Filled 2021-05-13: qty 5

## 2021-05-13 MED ORDER — SUGAMMADEX SODIUM 500 MG/5ML IV SOLN
INTRAVENOUS | Status: AC
  Filled 2021-05-13: qty 5

## 2021-05-13 MED ORDER — ONDANSETRON HCL 4 MG/2ML IJ SOLN: 4.0000 mg | Freq: Once | INTRAMUSCULAR | Status: DC | PRN

## 2021-05-13 MED ORDER — EPHEDRINE SULFATE 50 MG/ML IJ SOLN
INTRAMUSCULAR | Status: DC | PRN
  Administered 2021-05-13 (×2): 10 mg via INTRAVENOUS

## 2021-05-13 MED ORDER — CEFAZOLIN IN SODIUM CHLORIDE 3-0.9 GM/100ML-% IV SOLN
3.0000 g | INTRAVENOUS | Status: AC
  Administered 2021-05-13: 08:00:00 3 g via INTRAVENOUS
  Filled 2021-05-13: qty 100

## 2021-05-13 MED ORDER — BELLADONNA ALKALOIDS-OPIUM 16.2-60 MG RE SUPP
1.0000 | Freq: Once | RECTAL | Status: AC
  Administered 2021-05-13: 09:00:00 1 via RECTAL

## 2021-05-13 SURGICAL SUPPLY — 31 items
BAG DRAIN URO TABLE W/ADPT NS (BAG) ×3 IMPLANT
BAG DRN 8 ADPR NS SKTRN CSTL (BAG) ×1
BAG DRN RND TRDRP ANRFLXCHMBR (UROLOGICAL SUPPLIES)
BAG HAMPER (MISCELLANEOUS) ×3 IMPLANT
BAG URINE DRAIN 2000ML AR STRL (UROLOGICAL SUPPLIES) ×1 IMPLANT
CATH COUDE FOLEY 2W 5CC 22FR (CATHETERS) ×2 IMPLANT
CATH FOLEY 2WAY SLVR  5CC 22FR (CATHETERS) ×3
CATH FOLEY 2WAY SLVR 5CC 22FR (CATHETERS) IMPLANT
CATH FOLEY 3WAY 30CC 22F (CATHETERS) IMPLANT
CATH FOLEY LATEX FREE 22FR (CATHETERS)
CATH FOLEY LF 22FR (CATHETERS) IMPLANT
CLOTH BEACON ORANGE TIMEOUT ST (SAFETY) ×3 IMPLANT
DRSG TELFA 3X8 NADH (GAUZE/BANDAGES/DRESSINGS) ×3 IMPLANT
ELECT LOOP 22F BIPOLAR SML (ELECTROSURGICAL) ×3
ELECT REM PT RETURN 9FT ADLT (ELECTROSURGICAL) ×3
ELECTRODE LOOP 22F BIPOLAR SML (ELECTROSURGICAL) ×1 IMPLANT
ELECTRODE REM PT RTRN 9FT ADLT (ELECTROSURGICAL) IMPLANT
GLOVE SURG POLYISO LF SZ8 (GLOVE) ×3 IMPLANT
GLOVE SURG UNDER POLY LF SZ7 (GLOVE) ×6 IMPLANT
GOWN STRL REUS W/TWL LRG LVL3 (GOWN DISPOSABLE) ×3 IMPLANT
GOWN STRL REUS W/TWL XL LVL3 (GOWN DISPOSABLE) ×3 IMPLANT
IV NS IRRIG 3000ML ARTHROMATIC (IV SOLUTION) ×6 IMPLANT
KIT TURNOVER CYSTO (KITS) ×3 IMPLANT
PACK CYSTO (CUSTOM PROCEDURE TRAY) ×3 IMPLANT
PAD ARMBOARD 7.5X6 YLW CONV (MISCELLANEOUS) ×3 IMPLANT
PAD DRESSING TELFA 3X8 NADH (GAUZE/BANDAGES/DRESSINGS) IMPLANT
PLUG CATH AND CAP STER (CATHETERS) IMPLANT
SYR 30ML LL (SYRINGE) IMPLANT
SYR TOOMEY IRRIG 70ML (MISCELLANEOUS)
SYRINGE TOOMEY IRRIG 70ML (MISCELLANEOUS) IMPLANT
TOWEL OR 17X26 4PK STRL BLUE (TOWEL DISPOSABLE) ×3 IMPLANT

## 2021-05-13 NOTE — Transfer of Care (Signed)
Immediate Anesthesia Transfer of Care Note  Patient: Christian [name]  Procedure(s) Performed: TRANSURETHRAL RESECTION OF BLADDER TUMOR (TURBT) WITH GEMCITABINE  Patient Location: PACU  Anesthesia Type:General  Level of Consciousness: awake, alert , oriented and patient cooperative  Airway & Oxygen Therapy: Patient Spontanous Breathing  Post-op Assessment: Report given to RN, Post -op Vital signs reviewed and stable and Patient moving all extremities X 4  Post vital signs: Reviewed and stable  Last Vitals:  Vitals Value Taken Time  BP    Temp    Pulse    Resp    SpO2      Last Pain:  Vitals:   05/13/21 0701  TempSrc: Oral  PainSc: 0-No pain      Patients Stated Pain Goal: 9 (67/01/10 0349)  Complications: No notable events documented.

## 2021-05-13 NOTE — Anesthesia Postprocedure Evaluation (Signed)
Anesthesia Post Note  Patient: Christian [name]  Procedure(s) Performed: TRANSURETHRAL RESECTION OF BLADDER TUMOR (TURBT) (Bladder)  Patient location during evaluation: Phase II Anesthesia Type: General Level of consciousness: awake Pain management: pain level controlled Vital Signs Assessment: post-procedure vital signs reviewed and stable Respiratory status: spontaneous breathing and respiratory function stable Cardiovascular status: blood pressure returned to baseline and stable Postop Assessment: no headache and no apparent nausea or vomiting Anesthetic complications: no Comments: Late entry   No notable events documented.   Last Vitals:  Vitals:   05/13/21 1015 05/13/21 1023  BP: (!) 148/88 (!) 147/99  Pulse: 81 84  Resp: (!) 26 18  Temp:  36.7 C  SpO2: 94% 96%    Last Pain:  Vitals:   05/13/21 1023  TempSrc: Oral  PainSc: 0-No pain                 Louann Sjogren

## 2021-05-13 NOTE — Anesthesia Procedure Notes (Signed)
Procedure Name: Intubation Date/Time: 05/13/2021 8:09 AM Performed by: Jonna Munro, CRNA Pre-anesthesia Checklist: Patient identified, Emergency Drugs available, Suction available, Patient being monitored and Timeout performed Patient Re-evaluated:Patient Re-evaluated prior to induction Oxygen Delivery Method: Circle system utilized Preoxygenation: Pre-oxygenation with 100% oxygen Induction Type: IV induction Ventilation: Mask ventilation without difficulty Laryngoscope Size: Mac and 4 Grade View: Grade II Tube type: Oral Tube size: 7.5 mm Number of attempts: 1 Airway Equipment and Method: Stylet Secured at: 23 cm Tube secured with: Tape Dental Injury: Teeth and Oropharynx as per pre-operative assessment

## 2021-05-13 NOTE — Op Note (Signed)
Preoperative diagnosis: Bladder Tumor  Postop diagnosis: Same  Procedure: 1.  Cystoscopy 2.Transurethral resection of bladder tumor, small  Attending: [name]  Anesthesia: General  Estimated blood loss: 5 cc  Drains: 1. 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   After discussing treatment options the patient decided to proceed with transurethral resection of bladder tumor  Procedure in detail: Prior to procedure consetn was obtained. Patient was brought to the operating room and briefing was done sure correct patient, correct procedure, correct site.  General anesthesia was in administered patient was placed in the dorsal lithotomy position.  The rigid 49 French cystoscope was passed urethra and bladder.  Bladder was inspected masses or lesions and we noted a diffuse centimeter lesion on the right lateral wall as well as a 26mm lesion in the anterior dome of the bladder.  Removed the cystoscope and placed a 80 French resectoscope in the bladder.  Using bipolar electrocautery were then removed the 2 bladder tumors.  We removed the bladder tumors down to the base exposing muscle.  We then removed the pieces and sent them for pathology.  To obtain hemostasis we then cauterized the bed of the tumors.  Once good hemostasis was noted the bladder was then drained and a 18 French Foley catheter was placed. The patient then had 2g gemcitibine placed in the bladder and the foley was capped. This concluded the procedure which was well tolerated by the patient.  Complications: None  Condition: Stable,  extubated, transferred to PACU.  Plan: The gemcitibine will be drained in 1 hour and foley will be removed.

## 2021-05-13 NOTE — Interval H&P Note (Signed)
History and Physical Interval Note:  05/13/2021 7:44 AM  Christian [name]  has presented today for surgery, with the diagnosis of Bladder Tumor.  The various methods of treatment have been discussed with the patient and family. After consideration of risks, benefits and other options for treatment, the patient has consented to  Procedure(s) with comments: TRANSURETHRAL RESECTION OF BLADDER TUMOR (TURBT) WITH GEMCITABINE (N/A) - pt knows to arrive at 6:30 as a surgical intervention.  The patient's history has been reviewed, patient examined, no change in status, stable for surgery.  I have reviewed the patient's chart and labs.  Questions were answered to the patient's satisfaction.     [name]

## 2021-05-13 NOTE — Anesthesia Preprocedure Evaluation (Signed)
Anesthesia Evaluation  Patient identified by MRN, date of birth, ID band Patient awake    Reviewed: Allergy & Precautions, H&P , NPO status , Patient's Chart, lab work & pertinent test results, reviewed documented beta blocker date and time   Airway Mallampati: II  TM Distance: >3 FB Neck ROM: full    Dental no notable dental hx.    Pulmonary neg pulmonary ROS, former smoker,    Pulmonary exam normal breath sounds clear to auscultation       Cardiovascular Exercise Tolerance: Good hypertension, + CAD  + dysrhythmias  Rhythm:regular Rate:Normal     Neuro/Psych PSYCHIATRIC DISORDERS Anxiety Depression  Neuromuscular disease    GI/Hepatic Neg liver ROS, GERD  Medicated,  Endo/Other  negative endocrine ROS  Renal/GU negative Renal ROS  negative genitourinary   Musculoskeletal   Abdominal   Peds  Hematology negative hematology ROS (+)   Anesthesia Other Findings   Reproductive/Obstetrics negative OB ROS                             Anesthesia Physical Anesthesia Plan  ASA: 3  Anesthesia Plan: General and General LMA   Post-op Pain Management:    Induction:   PONV Risk Score and Plan: Ondansetron  Airway Management Planned:   Additional Equipment:   Intra-op Plan:   Post-operative Plan:   Informed Consent: I have reviewed the patients History and Physical, chart, labs and discussed the procedure including the risks, benefits and alternatives for the proposed anesthesia with the patient or authorized representative who has indicated his/her understanding and acceptance.     Dental Advisory Given  Plan Discussed with: CRNA  Anesthesia Plan Comments:         Anesthesia Quick Evaluation

## 2021-05-14 ENCOUNTER — Encounter (HOSPITAL_COMMUNITY): Payer: Self-pay | Admitting: Urology

## 2021-05-18 ENCOUNTER — Ambulatory Visit: Payer: 59 | Admitting: Urology

## 2021-05-19 ENCOUNTER — Telehealth: Payer: Self-pay

## 2021-05-19 ENCOUNTER — Encounter: Payer: Self-pay | Admitting: Urology

## 2021-05-19 ENCOUNTER — Other Ambulatory Visit: Payer: Self-pay

## 2021-05-19 ENCOUNTER — Ambulatory Visit (INDEPENDENT_AMBULATORY_CARE_PROVIDER_SITE_OTHER): Payer: 59 | Admitting: Urology

## 2021-05-19 VITALS — BP 178/97 | HR 71

## 2021-05-19 DIAGNOSIS — D494 Neoplasm of unspecified behavior of bladder: Secondary | ICD-10-CM | POA: Diagnosis not present

## 2021-05-19 NOTE — Telephone Encounter (Signed)
ERROR

## 2021-05-19 NOTE — Patient Instructions (Signed)
Bladder Cancer Bladder cancer is a condition in which abnormal tissue (a tumor) grows in the bladder. The bladder is the organ that holds urine. Two tubes (ureters) carry the urine from the kidneys to the bladder. The bladder wall is made of layers of tissue. Cancer that spreads through these layers of the bladder wall becomes more difficult to treat. What are the causes? The cause of this condition is not known. What increases the risk? The following factors may make you more likely to develop this condition: Smoking. Working where there are risks (occupational exposures), such as working with rubber, leather, clothing fabric, dyes, chemicals, and paint. Being 55 years of age or older. Being male. Having bladder inflammation that is long-term (chronic). Having a history of cancer, including: A family history of bladder cancer. Personal experience with bladder cancer. Having had certain treatments for cancer before. These include: Medicines to kill cancer cells (chemotherapy). Strong X-ray beams or capsules high in energy to kill cancer cells and shrink tumors (radiation therapy). Having been exposed to arsenic. This is a chemical element that can poison you. What are the signs or symptoms? Early symptoms of this condition include: Seeing blood in your urine. Feeling pain when urinating. Having infections of your urinary system (urinary tract infections or UTIs) that happen often. Having to urinate sooner or more often than usual. Later symptoms of this condition include: Not being able to urinate. Pain on one side of your lower back. Loss of appetite. Weight loss. Tiredness (fatigue). Swelling in your feet. Bone pain. How is this diagnosed? This condition is diagnosed based on: Your medical history. A physical exam. Lab tests, such as urine tests. Imaging tests. Your symptoms. You may also have other tests or procedures done, such as: A cystoscopy. A narrow tube is inserted  into your bladder through the organ that connects your bladder to the outside of your body (urethra). This is done to view the lining of your bladder for tumors. A biopsy. This procedure involves removing a tissue sample to look at it under a microscope to see if cancer is present. It is important to find out: How deeply into the bladder wall cancer has grown. Whether cancer has spread to any other parts of your body. This may require blood tests or imaging tests, such as a CT scan, MRI, bone scan, or X-rays. How is this treated? Your health care provider may recommend one or more types of treatment based on the stage of your cancer. The most common types of treatment are: Surgery to remove the cancer. Procedures that may be done include: Removing a tumor on the inside wall of the bladder (transurethral resection). Removing the bladder (cystectomy). Radiation therapy. This is often used together with chemotherapy. Chemotherapy. Immunotherapy. This uses medicines to help your immune system destroy cancer cells. Follow these instructions at home: Take over-the-counter and prescription medicines only as told by your health care provider. Eat a healthy diet. Some of your treatments might affect your appetite. Do not use any products that contain nicotine or tobacco, such as cigarettes, e-cigarettes, and chewing tobacco. If you need help quitting, ask your health care provider. Consider joining a support group. This may help you learn to cope with the stress of having bladder cancer. Tell your cancer care team if you develop side effects. Your team may be able to recommend ways to get relief. Keep all follow-up visits as told by your health care provider. This is important. Where to find more information American   Cancer Society: www.cancer.org National Cancer Institute (NCI): www.cancer.gov Contact a health care provider if: You have symptoms of a urinary tract infection. These  include: Fever. Chills. Weakness. Muscle aches. Pain in your abdomen. Urge to urinate that is stronger and happens more often than usual. Burning feeling in the bladder or urethra when you urinate. Get help right away if: There is blood in your urine. You cannot urinate. You have severe pain or other symptoms that do not go away. Summary Bladder cancer is a condition in which tumors grow in the bladder and cause illness. This condition is diagnosed based on your medical history, a physical exam, lab tests, imaging tests, and your symptoms. Your health care provider may recommend one or more types of treatment based on the stage of your cancer. Consider joining a support group. This may help you learn to cope with the stress of having bladder cancer. This information is not intended to replace advice given to you by your health care provider. Make sure you discuss any questions you have with your health care provider. Document Revised: 01/09/2019 Document Reviewed: 01/09/2019 Elsevier Patient Education  2022 [employer] Inc.  

## 2021-05-19 NOTE — Progress Notes (Signed)
Urological Symptom Review  Patient is experiencing the following symptoms: Hard to postpone urination Get up at night to urinate   Review of Systems  Gastrointestinal (upper)  : Negative for upper GI symptoms  Gastrointestinal (lower) : Negative for lower GI symptoms  Constitutional : Negative for symptoms  Skin: Negative for skin symptoms  Eyes: Negative for eye symptoms  Ear/Nose/Throat : Negative for Ear/Nose/Throat symptoms  Hematologic/Lymphatic: Negative for Hematologic/Lymphatic symptoms  Cardiovascular : Negative for cardiovascular symptoms  Respiratory : Negative for respiratory symptoms  Endocrine: Negative for endocrine symptoms  Musculoskeletal: Negative for musculoskeletal symptoms  Neurological: Negative for neurological symptoms  Psychologic: Negative for psychiatric symptoms
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
## 2021-05-23 ENCOUNTER — Other Ambulatory Visit: Payer: Self-pay | Admitting: Internal Medicine

## 2021-06-28 ENCOUNTER — Other Ambulatory Visit: Payer: Self-pay

## 2021-06-28 ENCOUNTER — Ambulatory Visit (INDEPENDENT_AMBULATORY_CARE_PROVIDER_SITE_OTHER): Payer: 59 | Admitting: Internal Medicine

## 2021-06-28 ENCOUNTER — Encounter: Payer: Self-pay | Admitting: Internal Medicine

## 2021-06-28 DIAGNOSIS — R739 Hyperglycemia, unspecified: Secondary | ICD-10-CM | POA: Diagnosis not present

## 2021-06-28 DIAGNOSIS — E291 Testicular hypofunction: Secondary | ICD-10-CM | POA: Diagnosis not present

## 2021-06-28 DIAGNOSIS — C670 Malignant neoplasm of trigone of bladder: Secondary | ICD-10-CM

## 2021-06-28 DIAGNOSIS — E559 Vitamin D deficiency, unspecified: Secondary | ICD-10-CM

## 2021-06-28 DIAGNOSIS — R5383 Other fatigue: Secondary | ICD-10-CM | POA: Diagnosis not present

## 2021-06-28 DIAGNOSIS — I2583 Coronary atherosclerosis due to lipid rich plaque: Secondary | ICD-10-CM

## 2021-06-28 DIAGNOSIS — I10 Essential (primary) hypertension: Secondary | ICD-10-CM

## 2021-06-28 DIAGNOSIS — I2510 Atherosclerotic heart disease of native coronary artery without angina pectoris: Secondary | ICD-10-CM | POA: Diagnosis not present

## 2021-06-28 NOTE — Assessment & Plan Note (Signed)
F/u w/Dr Alyson Ingles

## 2021-06-28 NOTE — Assessment & Plan Note (Signed)
On Vit D def

## 2021-06-28 NOTE — Progress Notes (Signed)
Subjective:  Patient ID: Christian [name], male    DOB: 1960-12-14  Age: 61 y.o. MRN: [id number]  CC: No chief complaint on file.   HPI [facility]ERS presents for CAD, HTN, LBP and hypogonadism f/u On Mounjaro  Outpatient Medications Prior to Visit  Medication Sig Dispense Refill   acetaminophen (TYLENOL) 500 MG tablet Take 1,000 mg by mouth every 8 (eight) hours as needed for moderate pain.     albuterol (VENTOLIN HFA) 108 (90 Base) MCG/ACT inhaler Inhale 2 puffs into the lungs every 6 (six) hours as needed for wheezing or shortness of breath. 8 g 5   amLODipine (NORVASC) 5 MG tablet TAKE 1 TABLET BY MOUTH EVERY DAY 90 tablet 3   ascorbic acid (VITAMIN C) 500 MG tablet Take 500 mg by mouth daily.     aspirin EC 81 MG tablet Take 81 mg by mouth daily. Swallow whole.     atorvastatin (LIPITOR) 20 MG tablet TAKE 1 TABLET BY MOUTH EVERY DAY 90 tablet 3   b complex vitamins capsule Take 1 capsule by mouth daily.     Cholecalciferol (VITAMIN D3) 2000 units capsule Take 1 capsule (2,000 Units total) by mouth daily. 100 capsule 3   clonazePAM (KLONOPIN) 1 MG tablet TAKE 1 TABLET BY MOUTH TWICE A DAY AS NEEDED FOR ANXIETY (Patient taking differently: Take 0.5 mg by mouth at bedtime as needed for anxiety.) 60 tablet 1   Colchicine 0.6 MG CAPS TAKE 1 CAPSULE TWICE DAILY FOR GOUT ATTACK 90 capsule 1   fenofibrate (TRICOR) 145 MG tablet TAKE 1 TABLET BY MOUTH EVERY DAY 90 tablet 3   HYDROcodone-acetaminophen (NORCO) 7.5-325 MG tablet Take 0.5-1 tablets by mouth every 4 (four) hours as needed for moderate pain or severe pain. 30 tablet 0   indomethacin (INDOCIN) 50 MG capsule TAKE 1 CAPSULE 3 TIMES DAILY AS NEEDED FOR MODERATE PAIN. OFFICE VISIT NEEDED BEFORE REFILLS 30 capsule 1   irbesartan (AVAPRO) 300 MG tablet TAKE 1 TABLET BY MOUTH EVERY DAY 90 tablet 3   Misc Natural Products (GLUCOSAMINE CHOND MSM FORMULA PO) Take 1 tablet by mouth daily.     Multiple Vitamins-Minerals (MULTIVITAMIN WITH  MINERALS) tablet Take 1 tablet by mouth daily.     OVER THE COUNTER MEDICATION Take 1 capsule by mouth daily. Lion's Mane Mushroom     testosterone cypionate (DEPOTESTOSTERONE CYPIONATE) 200 MG/ML injection Inject 1 mL (200 mg total) into the muscle every 14 (fourteen) days. 10 mL 5   tirzepatide (MOUNJARO) 2.5 MG/0.5ML Pen Inject 2.5 mg into the skin once a week. 2 mL 3   umeclidinium-vilanterol (ANORO ELLIPTA) 62.5-25 MCG/INH AEPB Inhale 1 puff into the lungs daily. 60 each 2   vitamin B-12 (CYANOCOBALAMIN) 500 MCG tablet Take 500 mcg by mouth daily.     zinc gluconate 50 MG tablet Take 50 mg by mouth daily.     No facility-administered medications prior to visit.    ROS: Review of Systems  Constitutional:  Negative for appetite change, fatigue and unexpected weight change.  HENT:  Negative for congestion, nosebleeds, sneezing, sore throat and trouble swallowing.   Eyes:  Negative for itching and visual disturbance.  Respiratory:  Negative for cough.   Cardiovascular:  Negative for chest pain, palpitations and leg swelling.  Gastrointestinal:  Negative for abdominal distention, blood in stool, diarrhea and nausea.  Genitourinary:  Negative for frequency and hematuria.  Musculoskeletal:  Positive for back pain and gait problem. Negative for arthralgias, joint swelling and  neck pain.  Skin:  Negative for rash.  Neurological:  Negative for dizziness, tremors, speech difficulty and weakness.  Psychiatric/Behavioral:  Negative for agitation, dysphoric mood, sleep disturbance and suicidal ideas. The patient is not nervous/anxious.    Objective:  BP 130/80 (BP Location: Left Arm, Patient Position: Sitting, Cuff Size: Large)    Pulse 72    Temp 98.5 F (36.9 C) (Oral)    Ht 5\' 9"  (1.753 m)    Wt 266 lb (120.7 kg)    SpO2 94%    BMI 39.28 kg/m   BP Readings from Last 3 Encounters:  06/28/21 130/80  05/19/21 (!) 178/97  05/13/21 (!) 147/99    Wt Readings from Last 3 Encounters:  06/28/21  266 lb (120.7 kg)  05/12/21 268 lb (121.6 kg)  03/25/21 273 lb 6.4 oz (124 kg)    Physical Exam Constitutional:      General: He is not in acute distress.    Appearance: He is well-developed. He is obese.     Comments: NAD  Eyes:     Conjunctiva/sclera: Conjunctivae normal.     Pupils: Pupils are equal, round, and reactive to light.  Neck:     Thyroid: No thyromegaly.     Vascular: No JVD.  Cardiovascular:     Rate and Rhythm: Normal rate and regular rhythm.     Heart sounds: Normal heart sounds. No murmur heard.   No friction rub. No gallop.  Pulmonary:     Effort: Pulmonary effort is normal. No respiratory distress.     Breath sounds: Normal breath sounds. No wheezing or rales.  Chest:     Chest wall: No tenderness.  Abdominal:     General: Bowel sounds are normal. There is no distension.     Palpations: Abdomen is soft. There is no mass.     Tenderness: There is no abdominal tenderness. There is no guarding or rebound.  Musculoskeletal:        General: No tenderness. Normal range of motion.     Cervical back: Normal range of motion.  Lymphadenopathy:     Cervical: No cervical adenopathy.  Skin:    General: Skin is warm and dry.     Findings: No rash.  Neurological:     Mental Status: He is alert and oriented to person, place, and time.     Cranial Nerves: No cranial nerve deficit.     Motor: No abnormal muscle tone.     Coordination: Coordination normal.     Gait: Gait normal.     Deep Tendon Reflexes: Reflexes are normal and symmetric.  Psychiatric:        Behavior: Behavior normal.        Thought Content: Thought content normal.        Judgment: Judgment normal.    Lab Results  Component Value Date   WBC 8.8 05/12/2021   HGB 14.3 05/12/2021   HCT 43.1 05/12/2021   PLT 181 05/12/2021   GLUCOSE 110 (H) 03/22/2021   CHOL 260 (H) 03/22/2021   TRIG 263.0 (H) 03/22/2021   HDL 41.30 03/22/2021   LDLDIRECT 191.0 03/22/2021   LDLCALC 96 02/10/2020   ALT 54  (H) 03/22/2021   AST 40 (H) 03/22/2021   NA 139 03/22/2021   K 4.5 03/22/2021   CL 101 03/22/2021   CREATININE 0.91 03/22/2021   BUN 18 03/22/2021   CO2 28 03/22/2021   TSH 2.39 03/22/2021   PSA 0.52 03/22/2021   INR 1.01 03/28/2012  HGBA1C 6.2 06/18/2020    No results found.  Assessment & Plan:   Problem List Items Addressed This Visit     Bladder cancer [facility])    F/u w/Dr McKenzie       Coronary artery disease    Cont on Lipitor, ASA 81      HTN (hypertension)    Cont w/wt loss on Mounjaro Cont on Norvasc and Irbesartan      Relevant Orders   Comprehensive metabolic panel   CBC with Differential/Platelet   Hyperglycemia    Cont on Mounjaro - same dose      Relevant Orders   Hemoglobin A1c   Hypogonadism male    Re-start on shots 2023  Potential benefits of a long term sex steroid  use as well as potential risks  and complications were explained to the patient and were aknowledged - OSA, MI etc.      Relevant Orders   TSH   Testosterone   Comprehensive metabolic panel   CBC with Differential/Platelet   PSA   Other fatigue    Better on Testosterone      Vitamin D deficiency    On Vit D def         No orders of the defined types were placed in this encounter.     Follow-up: Return in about 3 months (around 09/25/2021) for a follow-up visit.  Walker Kehr, MD

## 2021-06-28 NOTE — Assessment & Plan Note (Signed)
Cont w/wt loss on Mounjaro Cont on Norvasc and Irbesartan

## 2021-06-28 NOTE — Assessment & Plan Note (Signed)
Cont on Mounjaro - same dose

## 2021-06-28 NOTE — Assessment & Plan Note (Signed)
Cont on Lipitor, ASA 81 

## 2021-06-28 NOTE — Assessment & Plan Note (Signed)
Better on Testosterone

## 2021-06-28 NOTE — Assessment & Plan Note (Signed)
Re-start on shots 2023  Potential benefits of a long term sex steroid  use as well as potential risks  and complications were explained to the patient and were aknowledged - OSA, MI etc.

## 2021-07-06 ENCOUNTER — Telehealth: Payer: Self-pay | Admitting: Internal Medicine

## 2021-07-06 NOTE — Telephone Encounter (Signed)
Patient calling in  Wants to know if provider can increase dosage & submit refill [facility] pharmacy for tirzepatide [facility]) 2.5 MG/0.5ML Pen  [id number] - [place],  - [address] Phone:  [phone]  Fax:  [phone]

## 2021-07-06 NOTE — Telephone Encounter (Signed)
See below

## 2021-07-07 MED ORDER — TIRZEPATIDE 5 MG/0.5ML ~~LOC~~ SOAJ: 5.0000 mg | SUBCUTANEOUS | 3 refills | Status: DC

## 2021-07-07 NOTE — Telephone Encounter (Signed)
Okay.  Thanks.

## 2021-07-16 ENCOUNTER — Encounter: Payer: Self-pay | Admitting: Internal Medicine

## 2021-08-05 ENCOUNTER — Encounter: Payer: Self-pay | Admitting: Internal Medicine

## 2021-08-20 ENCOUNTER — Ambulatory Visit (INDEPENDENT_AMBULATORY_CARE_PROVIDER_SITE_OTHER): Payer: 59 | Admitting: Urology

## 2021-08-20 ENCOUNTER — Ambulatory Visit (HOSPITAL_COMMUNITY)
Admission: RE | Admit: 2021-08-20 | Discharge: 2021-08-20 | Disposition: A | Discharge status: Home / Self Care | Payer: 59 | Source: Ambulatory Visit | Attending: Urology | Admitting: Urology

## 2021-08-20 VITALS — BP 113/80 | HR 105

## 2021-08-20 DIAGNOSIS — R1011 Right upper quadrant pain: Secondary | ICD-10-CM | POA: Diagnosis present

## 2021-08-20 DIAGNOSIS — D494 Neoplasm of unspecified behavior of bladder: Secondary | ICD-10-CM

## 2021-08-20 LAB — URINALYSIS, ROUTINE W REFLEX MICROSCOPIC
Bilirubin, UA: NEGATIVE
Glucose, UA: NEGATIVE
Leukocytes,UA: NEGATIVE
Nitrite, UA: NEGATIVE
Protein,UA: NEGATIVE
RBC, UA: NEGATIVE
Specific Gravity, UA: >1.03 — ABNORMAL HIGH (ref 1.005–1.030)
Urobilinogen, Ur: 0.2 mg/dL (ref 0.2–1.0)
pH, UA: 5.5 (ref 5.0–7.5)

## 2021-08-20 MED ORDER — TAMSULOSIN HCL 0.4 MG PO CAPS: 0.4000 mg | ORAL_CAPSULE | Freq: Every day | ORAL | 1 refills | Status: DC

## 2021-08-20 MED ORDER — CIPROFLOXACIN HCL 500 MG PO TABS
500.0000 mg | ORAL_TABLET | Freq: Once | ORAL | Status: AC
  Administered 2021-08-20: 500 mg via ORAL
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
## 2021-08-23 LAB — CYTOLOGY, URINE

## 2021-08-30 ENCOUNTER — Other Ambulatory Visit: Payer: Self-pay | Admitting: Urology

## 2021-08-30 ENCOUNTER — Encounter: Payer: Self-pay | Admitting: Urology

## 2021-08-30 ENCOUNTER — Telehealth: Payer: Self-pay

## 2021-08-30 MED ORDER — HYDROCODONE-ACETAMINOPHEN 7.5-325 MG PO TABS: 0.5000 | ORAL_TABLET | ORAL | 0 refills | Status: DC | PRN

## 2021-08-30 NOTE — Telephone Encounter (Signed)
See medication request- also, wife left voicemail asking about surgery?  ?

## 2021-08-30 NOTE — Patient Instructions (Signed)

## 2021-08-30 NOTE — Telephone Encounter (Signed)
Surgical posting sheet received from Dr. Alyson Ingles ? ?I spoke with patient wife. We have discussed possible surgery dates and 09/09/2021 was agreed upon by all parties. Patient given information about surgery date, what to expect pre-operatively and post operatively.  ?  ?We discussed that a pre-op nurse will be calling to set up the pre-op visit that will take place prior to surgery. Informed patient that our office will communicate any additional care to be provided after surgery.  ?  ?Patients questions or concerns were discussed during our call. Advised to call our office should there be any additional information, questions or concerns that arise. Patient verbalized understanding.   ?

## 2021-08-30 NOTE — Telephone Encounter (Signed)
Patient wife called advising they needed a refill on medication. ? ? ?Medication: ?HYDROcodone-acetaminophen (NORCO) 7.5-325 MG tablet ? ? ?Pharmacy: ?[id number] [place], [place] - [address] ?

## 2021-09-03 ENCOUNTER — Telehealth: Payer: Self-pay

## 2021-09-03 NOTE — Telephone Encounter (Signed)
[facility] advising [facility] and they advised they would not be getting any in for approximately 3 months. Patients wife wanted it called into another pharmacy that they confirmed had medication in stock.  ? ?Medication: ?HYDROcodone-acetaminophen (NORCO) 7.5-325 MG tablet ? ? ?Pharmacy: ?[facility] in [place] ?[name], [place] [address] ?[phone] ? ? ?Thank you! ?

## 2021-09-06 ENCOUNTER — Other Ambulatory Visit: Payer: Self-pay | Admitting: Urology

## 2021-09-06 ENCOUNTER — Other Ambulatory Visit: Payer: Self-pay

## 2021-09-06 LAB — COLOGUARD: COLOGUARD: POSITIVE — AB

## 2021-09-06 MED ORDER — HYDROCODONE-ACETAMINOPHEN 7.5-325 MG PO TABS: 0.5000 | ORAL_TABLET | ORAL | 0 refills | Status: DC | PRN

## 2021-09-06 NOTE — Telephone Encounter (Signed)
NOTE NOT NEEDED ?

## 2021-09-06 NOTE — Telephone Encounter (Signed)
Wife called- [facility] unable to [facility] due to out of stock. ? ?Wife requested hydrocodone be sent to [facility] in [place]. Message sent to md.  ?

## 2021-09-07 NOTE — Patient Instructions (Signed)
? ? ? ? ? ? ? [name] ? 09/07/2021  ?  ? '@PREFPERIOPPHARMACY'$ @ ? ? Your procedure is scheduled on 09/09/2021. ? ? Report to [facility] at  1000 A.M. ? ? Call this number if you have problems the morning of surgery: ? [phone] ? ? Remember: ? Do not eat or drink after midnight. ?  ? ?   You should have stopped taking your mounjaro 09/01/2021. ? ?  Use your inhalers before you come and bring your rescue inhaler with you. ?  ? ? Take these medicines the morning of surgery with A SIP OF WATER  ? ?       amlodipine, hydrocodone or indocin (if needed). ?  ? Do not wear jewelry, make-up or nail polish. ? Do not wear lotions, powders, or perfumes, or deodorant. ? Do not shave 48 hours prior to surgery.  Men may shave face and neck. ? Do not bring valuables to the hospital. ? [facility] is not responsible for any belongings or valuables. ? ?Contacts, dentures or bridgework may not be worn into surgery.  Leave your suitcase in the car.  After surgery it may be brought to your room. ? ?For patients admitted to the hospital, discharge time will be determined by your treatment team. ? ?Patients discharged the day of surgery will not be allowed to drive home and must have someone with them for 24 hours.  ?  ? ?Special instructions:   DO NOT smoke tobacco or vape for 24 hours before your procedure. ? ?Please read over the following fact sheets that you were given. ?Coughing and Deep Breathing, Surgical Site Infection Prevention, Anesthesia Post-op Instructions, and Care and Recovery After Surgery ?  ? ? ? Transurethral Resection of Bladder Tumor, Care After ?The following information offers guidance on how to care for yourself after your procedure. Your health care provider may also give you more specific instructions. If you have problems or questions, contact your health care provider. ?What can I expect after the procedure? ?After the procedure, it is common to have: ?A small amount of blood or small blood clots in  your urine for up to 2 weeks. ?Soreness or mild pain from your catheter. After your catheter is removed, you may have mild soreness, especially when urinating. ?A need to urinate often. ?Pain in your lower abdomen. ?Follow these instructions at home: ?Medicines ? ?Take over-the-counter and prescription medicines only as told by your health care provider. ?If you were prescribed an antibiotic medicine, take it as told by your health care provider. Do not stop taking the antibiotic even if you start to feel better. ?Ask your health care provider if the medicine prescribed to you: ?Requires you to avoid driving or using machinery. ?Can cause constipation. You may need to take these actions to prevent or treat constipation: ?Drink enough fluid to keep your urine pale yellow. ?Take over-the-counter or prescription medicines. ?Eat foods that are high in fiber, such as beans, whole grains, and fresh fruits and vegetables. ?Limit foods that are high in fat and processed sugars, such as fried or sweet foods. ?Activity ? ?If you were given a sedative during the procedure, it can affect you for several hours. Do not drive or operate machinery until your health care provider says that it is safe. ?Rest as told by your health care provider. ?Avoid sitting for a long time without moving. Get up to take short walks every 1-2 hours. This is important to improve blood  flow and breathing. Ask for help if you feel weak or unsteady. ?Do not lift anything that is heavier than 10 lb (4.5 kg), or the limit that you are told, until your health care provider says that it is safe. ?Avoid intense physical activity for as long as told by your health care provider. ?Do not have sex until your health care provider approves. ?Return to your normal activities as told by your health care provider. Ask your health care provider what activities are safe for you. ?General instructions ?If you have a catheter, follow instructions from your health care  provider about caring for your catheter and your drainage bag. ?Do not drink alcohol for as long as told by your health care provider. This is especially important if you are taking prescription pain medicines. ?Do not use any products that contain nicotine or tobacco. These products include cigarettes, chewing tobacco, and vaping devices, such as e-cigarettes. If you need help quitting, ask your health care provider. ?Wear compression stockings as told by your health care provider. These stockings help to prevent blood clots and reduce swelling in your legs. ?Keep all follow-up visits. This is important. ?You will need to be followed closely with regular checks of your bladder and urethra (cystoscopies) to make sure that the cancer does not come back. ?Contact a health care provider if: ?You have blood in your urine for more than 2 weeks. ?You become constipated. Signs of constipation may include: ?Having fewer than three bowel movements in a week. ?Difficulty having a bowel movement. ?Stools that are dry, hard, or larger than normal. ?You have a urinary catheter in place, and you have: ?Spasms or pain. ?Problems with your catheter or your catheter is blocked. ?Your catheter has been taken out but you are unable to urinate. ?You have signs of infection, such as: ?Fever or chills. ?Cloudy or bad-smelling urine. ?Get help right away if: ?You have severe abdominal pain that gets worse or does not improve with medicine. ?You have a lot of large blood clots in your urine. ?You develop swelling or pain in your leg. ?You have difficulty breathing. ?These symptoms may be an emergency. Get help right away. Call 911. ?Do not wait to see if the symptoms will go away. ?Do not drive yourself to the hospital. ?Summary ?After your procedure, it is common to have a small amount of blood or small blood clots in your urine, soreness or mild pain from your catheter, and pain in your lower abdomen. ?Take over-the-counter and  prescription medicines only as told by your health care provider. ?Rest as told by your health care provider. Follow your health care provider's instructions about returning to normal activities. Ask what activities are safe for you. ?If you have a catheter, follow instructions from your health care provider about caring for your catheter and your drainage bag. ?This information is not intended to replace advice given to you by your health care provider. Make sure you discuss any questions you have with your health care provider. ?Document Revised: 05/07/2021 Document Reviewed: 05/07/2021 ?Elsevier Patient Education ? [place]. ?General Anesthesia, Adult, Care After ?This sheet gives you information about how to care for yourself after your procedure. Your health care provider may also give you more specific instructions. If you have problems or questions, contact your health care provider. ?What can I expect after the procedure? ?After the procedure, the following side effects are common: ?Pain or discomfort at the IV site. ?Nausea. ?Vomiting. ?Sore throat. ?Trouble concentrating. ?  Feeling cold or chills. ?Feeling weak or tired. ?Sleepiness and fatigue. ?Soreness and body aches. These side effects can affect parts of the body that were not involved in surgery. ?Follow these instructions at home: ?For the time period you were told by your health care provider: ? ?Rest. ?Do not participate in activities where you could fall or become injured. ?Do not drive or use machinery. ?Do not drink alcohol. ?Do not take sleeping pills or medicines that cause drowsiness. ?Do not make important decisions or sign legal documents. ?Do not take care of children on your own. ?Eating and drinking ?Follow any instructions from your health care provider about eating or drinking restrictions. ?When you feel hungry, start by eating small amounts of foods that are soft and easy to digest (bland), such as toast. Gradually return to  your regular diet. ?Drink enough fluid to keep your urine pale yellow. ?If you vomit, rehydrate by drinking water, juice, or clear broth. ?General instructions ?If you have sleep apnea, surgery and certain medi

## 2021-09-08 ENCOUNTER — Other Ambulatory Visit: Payer: Self-pay

## 2021-09-08 ENCOUNTER — Encounter (HOSPITAL_COMMUNITY): Payer: Self-pay

## 2021-09-08 ENCOUNTER — Encounter (HOSPITAL_COMMUNITY)
Admission: RE | Admit: 2021-09-08 | Discharge: 2021-09-08 | Disposition: A | Discharge status: Home / Self Care | Payer: 59 | Source: Ambulatory Visit | Attending: Urology | Admitting: Urology

## 2021-09-08 NOTE — Pre-Procedure Instructions (Signed)
Dr Charna Elizabeth spoke with patient about [facility]. Since patient had last dose on Saturday, he will have clear liquids only after 6 pm and a light dinner with no meats or vegetables. He is aware to drink clear apple juice to decrease chances of hypoglycemia. He verbalizes understanding. ?

## 2021-09-09 ENCOUNTER — Other Ambulatory Visit: Payer: Self-pay | Admitting: Internal Medicine

## 2021-09-09 ENCOUNTER — Encounter (HOSPITAL_COMMUNITY)
Admission: RE | Disposition: A | Discharge status: Home / Self Care | Payer: Self-pay | Source: Ambulatory Visit | Attending: Urology

## 2021-09-09 ENCOUNTER — Ambulatory Visit (HOSPITAL_COMMUNITY): Payer: 59 | Admitting: Anesthesiology

## 2021-09-09 ENCOUNTER — Ambulatory Visit (HOSPITAL_COMMUNITY)
Admission: RE | Admit: 2021-09-09 | Discharge: 2021-09-09 | Disposition: A | Discharge status: Home / Self Care | Payer: 59 | Source: Ambulatory Visit | Attending: Urology | Admitting: Urology

## 2021-09-09 ENCOUNTER — Encounter (HOSPITAL_COMMUNITY): Payer: Self-pay | Admitting: Urology

## 2021-09-09 ENCOUNTER — Ambulatory Visit (HOSPITAL_BASED_OUTPATIENT_CLINIC_OR_DEPARTMENT_OTHER): Payer: 59 | Admitting: Anesthesiology

## 2021-09-09 DIAGNOSIS — I10 Essential (primary) hypertension: Secondary | ICD-10-CM | POA: Insufficient documentation

## 2021-09-09 DIAGNOSIS — K219 Gastro-esophageal reflux disease without esophagitis: Secondary | ICD-10-CM | POA: Insufficient documentation

## 2021-09-09 DIAGNOSIS — F32A Depression, unspecified: Secondary | ICD-10-CM | POA: Insufficient documentation

## 2021-09-09 DIAGNOSIS — D494 Neoplasm of unspecified behavior of bladder: Secondary | ICD-10-CM

## 2021-09-09 DIAGNOSIS — I2510 Atherosclerotic heart disease of native coronary artery without angina pectoris: Secondary | ICD-10-CM | POA: Insufficient documentation

## 2021-09-09 DIAGNOSIS — G4730 Sleep apnea, unspecified: Secondary | ICD-10-CM | POA: Diagnosis not present

## 2021-09-09 DIAGNOSIS — Z79899 Other long term (current) drug therapy: Secondary | ICD-10-CM | POA: Insufficient documentation

## 2021-09-09 DIAGNOSIS — F419 Anxiety disorder, unspecified: Secondary | ICD-10-CM | POA: Insufficient documentation

## 2021-09-09 DIAGNOSIS — F418 Other specified anxiety disorders: Secondary | ICD-10-CM | POA: Diagnosis not present

## 2021-09-09 DIAGNOSIS — I4891 Unspecified atrial fibrillation: Secondary | ICD-10-CM | POA: Insufficient documentation

## 2021-09-09 DIAGNOSIS — J45909 Unspecified asthma, uncomplicated: Secondary | ICD-10-CM | POA: Insufficient documentation

## 2021-09-09 DIAGNOSIS — F1290 Cannabis use, unspecified, uncomplicated: Secondary | ICD-10-CM | POA: Insufficient documentation

## 2021-09-09 DIAGNOSIS — M1990 Unspecified osteoarthritis, unspecified site: Secondary | ICD-10-CM | POA: Diagnosis not present

## 2021-09-09 DIAGNOSIS — R195 Other fecal abnormalities: Secondary | ICD-10-CM

## 2021-09-09 DIAGNOSIS — Z87891 Personal history of nicotine dependence: Secondary | ICD-10-CM

## 2021-09-09 DIAGNOSIS — Z8551 Personal history of malignant neoplasm of bladder: Secondary | ICD-10-CM | POA: Insufficient documentation

## 2021-09-09 DIAGNOSIS — R109 Unspecified abdominal pain: Secondary | ICD-10-CM | POA: Insufficient documentation

## 2021-09-09 HISTORY — PX: CYSTOSCOPY: SHX5120

## 2021-09-09 HISTORY — PX: TRANSURETHRAL RESECTION OF BLADDER TUMOR: SHX2575

## 2021-09-09 LAB — GLUCOSE, CAPILLARY: Glucose-Capillary: 96 mg/dL (ref 70–99)

## 2021-09-09 SURGERY — CYSTOSCOPY
Anesthesia: General | Site: Bladder

## 2021-09-09 MED ORDER — SUGAMMADEX SODIUM 200 MG/2ML IV SOLN
INTRAVENOUS | Status: DC | PRN
  Administered 2021-09-09: 226.8 mg via INTRAVENOUS

## 2021-09-09 MED ORDER — SODIUM CHLORIDE 0.9 % IR SOLN
Status: DC | PRN
  Administered 2021-09-09 (×2): 3000 mL

## 2021-09-09 MED ORDER — METOCLOPRAMIDE HCL 5 MG/ML IJ SOLN
10.0000 mg | Freq: Once | INTRAMUSCULAR | Status: AC
  Administered 2021-09-09: 10 mg via INTRAVENOUS

## 2021-09-09 MED ORDER — STERILE WATER FOR IRRIGATION IR SOLN
Status: DC | PRN
Start: 2021-09-09 — End: 2021-09-09
  Administered 2021-09-09: 500 mL

## 2021-09-09 MED ORDER — CYCLOBENZAPRINE HCL 5 MG PO TABS
5.0000 mg | ORAL_TABLET | Freq: Three times a day (TID) | ORAL | 0 refills | Status: DC | PRN
Start: 2021-09-09 — End: 2021-10-13

## 2021-09-09 MED ORDER — FENTANYL CITRATE (PF) 250 MCG/5ML IJ SOLN
INTRAMUSCULAR | Status: DC | PRN
  Administered 2021-09-09: 50 ug via INTRAVENOUS
  Administered 2021-09-09: 100 ug via INTRAVENOUS

## 2021-09-09 MED ORDER — DIPHENHYDRAMINE HCL 50 MG/ML IJ SOLN
INTRAMUSCULAR | Status: DC | PRN
  Administered 2021-09-09: 12.5 mg via INTRAVENOUS

## 2021-09-09 MED ORDER — LIDOCAINE 2% (20 MG/ML) 5 ML SYRINGE
INTRAMUSCULAR | Status: DC | PRN
  Administered 2021-09-09: 80 mg via INTRAVENOUS

## 2021-09-09 MED ORDER — ORAL CARE MOUTH RINSE: 15.0000 mL | Freq: Once | OROMUCOSAL | Status: AC

## 2021-09-09 MED ORDER — PROPOFOL 10 MG/ML IV BOLUS
INTRAVENOUS | Status: AC
  Filled 2021-09-09: qty 20

## 2021-09-09 MED ORDER — FENTANYL CITRATE (PF) 250 MCG/5ML IJ SOLN
INTRAMUSCULAR | Status: AC
  Filled 2021-09-09: qty 5

## 2021-09-09 MED ORDER — PROPOFOL 10 MG/ML IV BOLUS
INTRAVENOUS | Status: DC | PRN
  Administered 2021-09-09: 140 mg via INTRAVENOUS

## 2021-09-09 MED ORDER — CHLORHEXIDINE GLUCONATE 0.12 % MT SOLN: 15.0000 mL | Freq: Once | OROMUCOSAL | Status: AC

## 2021-09-09 MED ORDER — CHLORHEXIDINE GLUCONATE 0.12 % MT SOLN
OROMUCOSAL | Status: AC
  Administered 2021-09-09: 15 mL via OROMUCOSAL
  Filled 2021-09-09: qty 15

## 2021-09-09 MED ORDER — CEFAZOLIN SODIUM-DEXTROSE 2-4 GM/100ML-% IV SOLN
2.0000 g | INTRAVENOUS | Status: AC
  Administered 2021-09-09: 2 g via INTRAVENOUS

## 2021-09-09 MED ORDER — METOCLOPRAMIDE HCL 5 MG/ML IJ SOLN
INTRAMUSCULAR | Status: AC
  Filled 2021-09-09: qty 2

## 2021-09-09 MED ORDER — ROCURONIUM BROMIDE 10 MG/ML (PF) SYRINGE
PREFILLED_SYRINGE | INTRAVENOUS | Status: DC | PRN
  Administered 2021-09-09: 30 mg via INTRAVENOUS

## 2021-09-09 MED ORDER — SUCCINYLCHOLINE CHLORIDE 200 MG/10ML IV SOSY
PREFILLED_SYRINGE | INTRAVENOUS | Status: DC | PRN
  Administered 2021-09-09: 120 mg via INTRAVENOUS

## 2021-09-09 MED ORDER — LACTATED RINGERS IV SOLN: INTRAVENOUS | Status: DC

## 2021-09-09 MED ORDER — CEFAZOLIN SODIUM-DEXTROSE 2-4 GM/100ML-% IV SOLN
INTRAVENOUS | Status: AC
  Filled 2021-09-09: qty 100

## 2021-09-09 MED ORDER — ONDANSETRON HCL 4 MG/2ML IJ SOLN
INTRAMUSCULAR | Status: DC | PRN
  Administered 2021-09-09: 4 mg via INTRAVENOUS

## 2021-09-09 MED ORDER — OXYCODONE-ACETAMINOPHEN 5-325 MG PO TABS
1.0000 | ORAL_TABLET | Freq: Once | ORAL | Status: DC
Start: 2021-09-09 — End: 2021-09-09

## 2021-09-09 MED ORDER — OXYCODONE-ACETAMINOPHEN 5-325 MG PO TABS: 2.0000 | ORAL_TABLET | Freq: Once | ORAL | Status: DC

## 2021-09-09 MED ORDER — METOCLOPRAMIDE HCL 5 MG/ML IJ SOLN
INTRAMUSCULAR | Status: DC | PRN
  Administered 2021-09-09: 10 mg via INTRAVENOUS

## 2021-09-09 MED ORDER — MIDAZOLAM HCL 2 MG/2ML IJ SOLN
INTRAMUSCULAR | Status: DC | PRN
Start: 2021-09-09 — End: 2021-09-09
  Administered 2021-09-09: 2 mg via INTRAVENOUS

## 2021-09-09 MED ORDER — PHENYLEPHRINE 80 MCG/ML (10ML) SYRINGE FOR IV PUSH (FOR BLOOD PRESSURE SUPPORT)
PREFILLED_SYRINGE | INTRAVENOUS | Status: DC | PRN
  Administered 2021-09-09: 80 ug via INTRAVENOUS
  Administered 2021-09-09: 160 ug via INTRAVENOUS

## 2021-09-09 MED ORDER — MIDAZOLAM HCL 2 MG/2ML IJ SOLN
INTRAMUSCULAR | Status: AC
Start: 2021-09-09 — End: ?
  Filled 2021-09-09: qty 2

## 2021-09-09 MED ORDER — HYDROCODONE-ACETAMINOPHEN 7.5-325 MG PO TABS: 0.5000 | ORAL_TABLET | ORAL | 0 refills | Status: DC | PRN

## 2021-09-09 SURGICAL SUPPLY — 23 items
BAG DRAIN URO TABLE W/ADPT NS (BAG) ×2 IMPLANT
BAG DRN 8 ADPR NS SKTRN CSTL (BAG) ×1
BAG HAMPER (MISCELLANEOUS) ×2 IMPLANT
CLOTH BEACON ORANGE TIMEOUT ST (SAFETY) ×2 IMPLANT
ELECT LOOP 22F BIPOLAR SML (ELECTROSURGICAL) ×2
ELECTRODE LOOP 22F BIPOLAR SML (ELECTROSURGICAL) ×1 IMPLANT
GLOVE BIO SURGEON STRL SZ8 (GLOVE) ×2 IMPLANT
GLOVE BIOGEL PI IND STRL 7.0 (GLOVE) ×2 IMPLANT
GLOVE BIOGEL PI INDICATOR 7.0 (GLOVE) ×2
GOWN STRL REUS W/TWL LRG LVL3 (GOWN DISPOSABLE) ×4 IMPLANT
GOWN STRL REUS W/TWL XL LVL3 (GOWN DISPOSABLE) ×2 IMPLANT
IV NS IRRIG 3000ML ARTHROMATIC (IV SOLUTION) ×4 IMPLANT
KIT TURNOVER CYSTO (KITS) ×2 IMPLANT
MANIFOLD NEPTUNE II (INSTRUMENTS) ×2 IMPLANT
PACK CYSTO (CUSTOM PROCEDURE TRAY) ×2 IMPLANT
PAD ARMBOARD 7.5X6 YLW CONV (MISCELLANEOUS) ×2 IMPLANT
PLUG CATH AND CAP STER (CATHETERS) IMPLANT
SYR 30ML LL (SYRINGE) IMPLANT
SYR TOOMEY IRRIG 70ML (MISCELLANEOUS)
SYRINGE TOOMEY IRRIG 70ML (MISCELLANEOUS) IMPLANT
TOWEL NATURAL 4PK STERILE (DISPOSABLE) ×2 IMPLANT
TOWEL OR 17X26 4PK STRL BLUE (TOWEL DISPOSABLE) ×2 IMPLANT
WATER STERILE IRR 500ML POUR (IV SOLUTION) ×2 IMPLANT

## 2021-09-09 NOTE — Anesthesia Procedure Notes (Addendum)
Procedure Name: Intubation ?Date/Time: 09/09/2021 11:35 AM ?Performed by: Minerva Ends, CRNA ?Pre-anesthesia Checklist: Patient identified, Emergency Drugs available, Suction available and Patient being monitored ?Patient Re-evaluated:Patient Re-evaluated prior to induction ?Oxygen Delivery Method: Circle system utilized ?Preoxygenation: Pre-oxygenation with 100% oxygen ?Induction Type: IV induction, Rapid sequence and Cricoid Pressure applied ?Laryngoscope Size: Mac and 3 ?Grade View: Grade III ?Tube type: Oral ?Tube size: 7.0 mm ?Number of attempts: 1 ?Airway Equipment and Method: Stylet and Oral airway ?Placement Confirmation: ETT inserted through vocal cords under direct vision, positive ETCO2 and breath sounds checked- equal and bilateral ?Secured at: 23 cm ?Tube secured with: Tape ?Dental Injury: Teeth and Oropharynx as per pre-operative assessment  ? ? ? ? ?

## 2021-09-09 NOTE — Anesthesia Postprocedure Evaluation (Signed)
Anesthesia Post Note ? ?Patient: Christian [name] ? ?Procedure(s) Performed: CYSTOSCOPY (Bladder) ?TRANSURETHRAL RESECTION OF BLADDER TUMOR (TURBT) (Bladder) ? ?Patient location during evaluation: Phase II ?Anesthesia Type: General ?Level of consciousness: awake and alert and oriented ?Pain management: pain level controlled ?Vital Signs Assessment: post-procedure vital signs reviewed and stable ?Respiratory status: spontaneous breathing, nonlabored ventilation and respiratory function stable ?Cardiovascular status: blood pressure returned to baseline and stable ?Postop Assessment: no apparent nausea or vomiting ?Anesthetic complications: no ? ? ?No notable events documented. ? ? ?Last Vitals:  ?Vitals:  ? 09/09/21 1230 09/09/21 1256  ?BP: 121/86 127/89  ?Pulse: 68 70  ?Resp: 12 17  ?Temp: 36.7 ?C 36.7 ?C  ?SpO2: 96% 98%  ?  ?Last Pain:  ?Vitals:  ? 09/09/21 1256  ?TempSrc: Oral  ?PainSc: 3   ? ? ?  ?  ?  ?  ?  ?  ? ?[name] C Ewell Benassi ? ? ? ? ?

## 2021-09-09 NOTE — Transfer of Care (Signed)
Immediate Anesthesia Transfer of Care Note ? ?Patient: Christian [name] ? ?Procedure(s) Performed: CYSTOSCOPY (Bladder) ?TRANSURETHRAL RESECTION OF BLADDER TUMOR (TURBT) (Bladder) ? ?Patient Location: PACU ? ?Anesthesia Type:General ? ?Level of Consciousness: awake, alert  and oriented ? ?Airway & Oxygen Therapy: Patient Spontanous Breathing ? ?Post-op Assessment: Report given to RN and Post -op Vital signs reviewed and stable ? ?Post vital signs: Reviewed and stable ? ?Last Vitals:  ?Vitals Value Taken Time  ?BP    ?Temp    ?Pulse    ?Resp    ?SpO2    ? ? ?Last Pain:  ?Vitals:  ? 09/09/21 1024  ?TempSrc: Oral  ?PainSc: 2   ?   ? ?  ? ?Complications: No notable events documented. ?

## 2021-09-09 NOTE — Interval H&P Note (Signed)
History and Physical Interval Note: ? ?09/09/2021 ?11:13 AM ? ?Christian [name]  has presented today for surgery, with the diagnosis of bladder tumor.  The various methods of treatment have been discussed with the patient and family. After consideration of risks, benefits and other options for treatment, the patient has consented to  Procedure(s): ?CYSTOSCOPY (N/A) ?TRANSURETHRAL RESECTION OF BLADDER TUMOR (TURBT) (N/A) as a surgical intervention.  The patient's history has been reviewed, patient examined, no change in status, stable for surgery.  I have reviewed the patient's chart and labs.  Questions were answered to the patient's satisfaction.   ? ? ?[name] ? ? ?

## 2021-09-09 NOTE — Anesthesia Preprocedure Evaluation (Signed)
Anesthesia Evaluation  ?Patient identified by MRN, date of birth, ID band ?Patient awake ? ? ? ?Reviewed: ?Allergy & Precautions, NPO status , Patient's Chart, lab work & pertinent test results ? ?Airway ?Mallampati: III ? ?TM Distance: >3 FB ?Neck ROM: Full ? ? ? Dental ? ?(+) Dental Advisory Given, Teeth Intact ?  ?Pulmonary ?shortness of breath and with exertion, asthma , sleep apnea (non complaint with CPAP) , former smoker,  ?  ?Pulmonary exam normal ?breath sounds clear to auscultation ? ? ? ? ? ? Cardiovascular ?hypertension, Pt. on medications ?+ CAD  ?Normal cardiovascular exam+ dysrhythmias Atrial Fibrillation  ?Rhythm:Regular Rate:Normal ? ?12-May-2021 08:21:25 [facility] ROUTINE RECORD ?[birth year] (14 yr) ?Male Caucasian ?Vent. rate 82 BPM ?PR interval 206 ms ?QRS duration 164 ms ?QT/QTcB 404/472 ms ?P-R-T axes 49 -84 52 ?Normal sinus rhythm ?Right bundle branch block ?Left anterior fascicular block  Bifascicular block  ?Abnormal ECG ?Confirmed by Asencion Noble [phone]) on 05/13/2021 6:43:48 AM ?  ?Neuro/Psych ?PSYCHIATRIC DISORDERS Anxiety Depression  Neuromuscular disease   ? GI/Hepatic ?GERD  Medicated,(+)  ?  ? substance abuse ? alcohol use and marijuana use,   ?Endo/Other  ?negative endocrine ROS ? Renal/GU ?negative Renal ROS Bladder dysfunction (bladder cancer) ? ? ? ?  ?Musculoskeletal ? ?(+) Arthritis , Osteoarthritis,   ? Abdominal ?  ?Peds ?negative pediatric ROS ?(+)  Hematology ?negative hematology ROS ?(+)   ?Anesthesia Other Findings ?1. Left ventricular ejection fraction, by estimation, is 60 to 65%. The  ?left ventricle has normal function. The left ventricle has no regional  ?wall motion abnormalities. There is mild left ventricular hypertrophy.  ?Left ventricular diastolic parameters  ?are consistent with Grade I diastolic dysfunction (impaired relaxation).  ?Elevated left ventricular end-diastolic pressure.  ??2. Right ventricular  systolic function is normal. The right ventricular  ?size is normal. Tricuspid regurgitation signal is inadequate for assessing  ?PA pressure.  ??3. The mitral valve is abnormal. Trivial mitral valve regurgitation.  ??4. The aortic valve is tricuspid. Aortic valve regurgitation is not visualized.  ??5. The inferior vena cava is normal in size with <50% respiratory  ?variability, suggesting right atrial pressure of 8 mmHg. ? Reproductive/Obstetrics ?negative OB ROS ? ?  ? ? ? ? ? ? ? ? ? ? ? ? ? ?  ?  ? ? ? ? ? ? ?Anesthesia Physical ?Anesthesia Plan ? ?ASA: 3 ? ?Anesthesia Plan: General  ? ?Post-op Pain Management: Dilaudid IV  ? ?Induction: Intravenous and Rapid sequence ? ?PONV Risk Score and Plan: 4 or greater and Ondansetron, Dexamethasone, Midazolam and Metaclopromide ? ?Airway Management Planned: Oral ETT ? ?Additional Equipment:  ? ?Intra-op Plan:  ? ?Post-operative Plan: Extubation in OR ? ?Informed Consent: I have reviewed the patients History and Physical, chart, labs and discussed the procedure including the risks, benefits and alternatives for the proposed anesthesia with the patient or authorized representative who has indicated his/her understanding and acceptance.  ? ? ? ?Dental advisory given ? ?Plan Discussed with: CRNA and Surgeon ? ?Anesthesia Plan Comments:   ? ? ? ? ? ?Anesthesia Quick Evaluation ? ?

## 2021-09-09 NOTE — Op Note (Addendum)
.  Preoperative diagnosis: bladder tumor ? ?Postoperative diagnosis: Same ? ?Procedure: 1 cystoscopy ?2.Transurethral resection of bladder tumor, small ? ?Attending: Rosie Fate ? ?Anesthesia: General ? ?Estimated blood loss: Minimal ? ?Drains: 22 French foley ? ?Specimens: none ? ?Antibiotics: ancef ? ?Findings: 1cm papillary dome tumor.  Ureteral orifices in normal anatomic location.  ? ?Indications: Patient is a 61 year old male with a history of bladder tumor found on office cystoscopy.  After discussing treatment options, they decided proceed with transurethral resection of a bladder tumor. ? ?Procedure in detail: The patient was brought to the operating room and a brief timeout was done to ensure correct patient, correct procedure, correct site.  General anesthesia was administered patient was placed in dorsal lithotomy position.  Their genitalia was then prepped and draped in usual sterile fashion.  A rigid 72 French cystoscope was passed in the urethra and the bladder.  Bladder was inspected and we noted a 1cm bladder tumor.  the ureteral orifices were in the normal orthotopic locations.  Using the bipolar resectoscope we removed the bladder tumor down to the base. Hemostasis was then obtained with electrocautery. We then re-inspected the bladder and found no residual bleeding.  the bladder was then drained and this concluded the procedure which was well tolerated by patient. ? ?Complications: None ? ?Condition: Stable, extubated, transferred to PACU ? ?Plan: Patient is to be discharged home and followup in 1 week ?

## 2021-09-11 ENCOUNTER — Other Ambulatory Visit: Payer: Self-pay | Admitting: Urology

## 2021-09-13 ENCOUNTER — Encounter (HOSPITAL_COMMUNITY): Payer: Self-pay | Admitting: Urology

## 2021-09-16 ENCOUNTER — Other Ambulatory Visit: Payer: Self-pay | Admitting: Internal Medicine

## 2021-09-17 ENCOUNTER — Ambulatory Visit (INDEPENDENT_AMBULATORY_CARE_PROVIDER_SITE_OTHER): Payer: 59 | Admitting: Urology

## 2021-09-17 ENCOUNTER — Encounter: Payer: Self-pay | Admitting: Urology

## 2021-09-17 VITALS — BP 91/68 | HR 73

## 2021-09-17 DIAGNOSIS — D494 Neoplasm of unspecified behavior of bladder: Secondary | ICD-10-CM

## 2021-09-17 DIAGNOSIS — M549 Dorsalgia, unspecified: Secondary | ICD-10-CM
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
## 2021-09-17 NOTE — Patient Instructions (Signed)

## 2021-09-21 ENCOUNTER — Encounter: Payer: Self-pay | Admitting: Gastroenterology

## 2021-09-27 ENCOUNTER — Ambulatory Visit: Payer: 59 | Admitting: Internal Medicine

## 2021-10-01 ENCOUNTER — Other Ambulatory Visit (INDEPENDENT_AMBULATORY_CARE_PROVIDER_SITE_OTHER): Payer: 59

## 2021-10-01 DIAGNOSIS — E291 Testicular hypofunction: Secondary | ICD-10-CM | POA: Diagnosis not present

## 2021-10-01 DIAGNOSIS — R739 Hyperglycemia, unspecified: Secondary | ICD-10-CM | POA: Diagnosis not present

## 2021-10-01 DIAGNOSIS — I10 Essential (primary) hypertension: Secondary | ICD-10-CM

## 2021-10-01 LAB — CBC WITH DIFFERENTIAL/PLATELET
Basophils Absolute: 0 10*3/uL (ref 0.0–0.1)
Basophils Relative: 0.6 % (ref 0.0–3.0)
Eosinophils Absolute: 0.2 10*3/uL (ref 0.0–0.7)
Eosinophils Relative: 3.5 % (ref 0.0–5.0)
HCT: 48.4 % (ref 39.0–52.0)
Hemoglobin: 16.3 g/dL (ref 13.0–17.0)
Lymphocytes Relative: 21.6 % (ref 12.0–46.0)
Lymphs Abs: 1.4 10*3/uL (ref 0.7–4.0)
MCHC: 33.7 g/dL (ref 30.0–36.0)
MCV: 91.8 fl (ref 78.0–100.0)
Monocytes Absolute: 0.5 10*3/uL (ref 0.1–1.0)
Monocytes Relative: 7.9 % (ref 3.0–12.0)
Neutro Abs: 4.3 10*3/uL (ref 1.4–7.7)
Neutrophils Relative %: 66.4 % (ref 43.0–77.0)
Platelets: 162 10*3/uL (ref 150.0–400.0)
RBC: 5.27 Mil/uL (ref 4.22–5.81)
RDW: 13.8 % (ref 11.5–15.5)
WBC: 6.6 10*3/uL (ref 4.0–10.5)

## 2021-10-01 LAB — COMPREHENSIVE METABOLIC PANEL
ALT: 15 U/L (ref 0–53)
AST: 17 U/L (ref 0–37)
Albumin: 4.6 g/dL (ref 3.5–5.2)
Alkaline Phosphatase: 55 U/L (ref 39–117)
BUN: 13 mg/dL (ref 6–23)
CO2: 27 mEq/L (ref 19–32)
Calcium: 9.7 mg/dL (ref 8.4–10.5)
Chloride: 101 mEq/L (ref 96–112)
Creatinine, Ser: 1.01 mg/dL (ref 0.40–1.50)
GFR: 81.75 mL/min (ref >60.00)
Glucose, Bld: 86 mg/dL (ref 70–99)
Potassium: 4.6 mEq/L (ref 3.5–5.1)
Sodium: 138 mEq/L (ref 135–145)
Total Bilirubin: 0.8 mg/dL (ref 0.2–1.2)
Total Protein: 6.8 g/dL (ref 6.0–8.3)

## 2021-10-01 LAB — TSH: TSH: 1.58 u[IU]/mL (ref 0.35–5.50)

## 2021-10-01 LAB — HEMOGLOBIN A1C: Hgb A1c MFr Bld: 5.3 % (ref 4.6–6.5)

## 2021-10-01 LAB — TESTOSTERONE: Testosterone: 969.77 ng/dL — ABNORMAL HIGH (ref 300.00–890.00)

## 2021-10-01 LAB — PSA: PSA: 0.85 ng/mL (ref 0.10–4.00)

## 2021-10-04 ENCOUNTER — Ambulatory Visit (INDEPENDENT_AMBULATORY_CARE_PROVIDER_SITE_OTHER): Payer: 59 | Admitting: Internal Medicine

## 2021-10-04 ENCOUNTER — Encounter: Payer: Self-pay | Admitting: Internal Medicine

## 2021-10-04 ENCOUNTER — Telehealth: Payer: Self-pay

## 2021-10-04 DIAGNOSIS — M545 Low back pain, unspecified: Secondary | ICD-10-CM

## 2021-10-04 DIAGNOSIS — E291 Testicular hypofunction: Secondary | ICD-10-CM

## 2021-10-04 DIAGNOSIS — I10 Essential (primary) hypertension: Secondary | ICD-10-CM

## 2021-10-04 DIAGNOSIS — G8929 Other chronic pain: Secondary | ICD-10-CM

## 2021-10-04 DIAGNOSIS — Z6839 Body mass index (BMI) 39.0-39.9, adult: Secondary | ICD-10-CM

## 2021-10-04 MED ORDER — INDOMETHACIN 50 MG PO CAPS: ORAL_CAPSULE | ORAL | 1 refills | Status: DC

## 2021-10-04 MED ORDER — HYDROCODONE-ACETAMINOPHEN 7.5-325 MG PO TABS: 0.5000 | ORAL_TABLET | ORAL | 0 refills | Status: DC | PRN

## 2021-10-04 MED ORDER — TIRZEPATIDE 7.5 MG/0.5ML ~~LOC~~ SOAJ: 7.5000 mg | SUBCUTANEOUS | 3 refills | Status: DC

## 2021-10-04 NOTE — Assessment & Plan Note (Signed)
Much better. Increase Mounjaro.

## 2021-10-04 NOTE — Assessment & Plan Note (Signed)
Cont on shots   Potential benefits of a long term sex steroid  use as well as potential risks  and complications were explained to the patient and were aknowledged - OSA, MI etc.

## 2021-10-04 NOTE — Telephone Encounter (Signed)
Pt wife is calling to have the Rx for HYDROcodone-acetaminophen (NORCO) 7.5-325 MG tablet sent to the [place] [place] - [address] [place] [address] [place] [address]  [facility] is out of stock  Please advise

## 2021-10-04 NOTE — Assessment & Plan Note (Signed)
LBP - R flank - worse w/ROM. CT - no urinary obstruction. Norco as needed rare use  Potential benefits of a long term opioids use as well as potential risks (i.e. addiction risk, apnea etc) and complications (i.e. Somnolence, constipation and others) were explained to the patient and were aknowledged. Indocin - rare use

## 2021-10-04 NOTE — Assessment & Plan Note (Signed)
Discont Norvasc and Irbesartan due to low BP after wt loss

## 2021-10-04 NOTE — Progress Notes (Addendum)
Subjective:  Patient ID: Christian [name], male    DOB: 11-10-1960  Age: 61 y.o. MRN: [id number]  CC: No chief complaint on file.   HPI Christian [name] presents for HTN - BP was low after 30 lbs wt loss. F/u on hypogonadism, DM. C/o LBP - R flank - worse w/ROM. CT - no urinary obstruction.  Outpatient Medications Prior to Visit  Medication Sig Dispense Refill   acetaminophen (TYLENOL) 500 MG tablet Take 1,000 mg by mouth every 8 (eight) hours as needed for moderate pain.     albuterol (VENTOLIN HFA) 108 (90 Base) MCG/ACT inhaler Inhale 2 puffs into the lungs every 6 (six) hours as needed for wheezing or shortness of breath. 8 g 5   ascorbic acid (VITAMIN C) 500 MG tablet Take 500 mg by mouth 2 (two) times a week.     aspirin EC 81 MG tablet Take 81 mg by mouth daily. Swallow whole.     atorvastatin (LIPITOR) 20 MG tablet TAKE 1 TABLET BY MOUTH EVERY DAY 90 tablet 3   b complex vitamins capsule Take 1 capsule by mouth every other day.     Cholecalciferol (VITAMIN D3) 2000 units capsule Take 1 capsule (2,000 Units total) by mouth daily. (Patient taking differently: Take 2,000 Units by mouth every other day.) 100 capsule 3   clonazePAM (KLONOPIN) 1 MG tablet TAKE 1 TABLET BY MOUTH TWICE A DAY AS NEEDED FOR ANXIETY (Patient taking differently: Take 0.5 mg by mouth at bedtime as needed for anxiety.) 60 tablet 1   Colchicine 0.6 MG CAPS TAKE 1 CAPSULE TWICE DAILY FOR GOUT ATTACK (Patient taking differently: Take 0.6 mg by mouth daily as needed (gout attack).) 90 capsule 1   cyclobenzaprine (FLEXERIL) 5 MG tablet Take 1 tablet (5 mg total) by mouth 3 (three) times daily as needed for muscle spasms. 30 tablet 0   fenofibrate (TRICOR) 145 MG tablet TAKE 1 TABLET BY MOUTH EVERY DAY 90 tablet 3   Misc Natural Products (GLUCOSAMINE CHOND MSM FORMULA PO) Take 1 tablet by mouth 2 (two) times a week.     Multiple Vitamins-Minerals (MULTIVITAMIN WITH MINERALS) tablet Take 1 tablet by mouth every other day.      OVER THE COUNTER MEDICATION Take 1 capsule by mouth 2 (two) times a week. Lion's Mane Mushroom     tamsulosin (FLOMAX) 0.4 MG CAPS capsule TAKE 1 CAPSULE BY MOUTH EVERY DAY AFTER SUPPER 30 capsule 1   testosterone cypionate (DEPOTESTOSTERONE CYPIONATE) 200 MG/ML injection Inject 1 mL (200 mg total) into the muscle every 14 (fourteen) days. 10 mL 5   umeclidinium-vilanterol (ANORO ELLIPTA) 62.5-25 MCG/INH AEPB Inhale 1 puff into the lungs daily. 60 each 2   vitamin B-12 (CYANOCOBALAMIN) 500 MCG tablet Take 500 mcg by mouth once a week.     zinc gluconate 50 MG tablet Take 50 mg by mouth daily.     HYDROcodone-acetaminophen (NORCO) 7.5-325 MG tablet Take 0.5-1 tablets by mouth every 4 (four) hours as needed for moderate pain or severe pain. 30 tablet 0   indomethacin (INDOCIN) 50 MG capsule TAKE 1 CAPSULE 3 TIMES DAILY AS NEEDED FOR MODERATE PAIN. OFFICE VISIT NEEDED BEFORE REFILLS 30 capsule 1   tirzepatide (MOUNJARO) 5 MG/0.5ML Pen Inject 5 mg into the skin once a week. 6 mL 3   amLODipine (NORVASC) 5 MG tablet TAKE 1 TABLET BY MOUTH EVERY DAY (Patient not taking: Reported on 10/04/2021) 90 tablet 2   irbesartan (AVAPRO) 300 MG tablet TAKE  1 TABLET BY MOUTH EVERY DAY (Patient not taking: Reported on 10/04/2021) 90 tablet 3   No facility-administered medications prior to visit.    ROS: Review of Systems  Constitutional:  Negative for appetite change, fatigue and unexpected weight change.  HENT:  Negative for congestion, nosebleeds, sneezing, sore throat and trouble swallowing.   Eyes:  Negative for itching and visual disturbance.  Respiratory:  Negative for cough.   Cardiovascular:  Negative for chest pain, palpitations and leg swelling.  Gastrointestinal:  Negative for abdominal distention, blood in stool, diarrhea and nausea.  Genitourinary:  Negative for frequency and hematuria.  Musculoskeletal:  Positive for back pain. Negative for gait problem, joint swelling and neck pain.  Skin:   Negative for rash.  Neurological:  Negative for dizziness, tremors, speech difficulty and weakness.  Psychiatric/Behavioral:  Negative for agitation, dysphoric mood, sleep disturbance and suicidal ideas. The patient is not nervous/anxious.    Objective:  BP 120/82 (BP Location: Right Arm, Patient Position: Sitting, Cuff Size: Normal)   Pulse 68   Temp 99.1 F (37.3 C) (Oral)   Ht '5\' 8"'$  (1.727 m)   Wt 243 lb (110.2 kg)   SpO2 98%   BMI 36.95 kg/m   BP Readings from Last 3 Encounters:  10/04/21 120/82  09/17/21 91/68  09/09/21 127/89    Wt Readings from Last 3 Encounters:  10/04/21 243 lb (110.2 kg)  09/09/21 250 lb (113.4 kg)  09/08/21 250 lb (113.4 kg)    Physical Exam Constitutional:      General: He is not in acute distress.    Appearance: He is well-developed.     Comments: NAD  Eyes:     Conjunctiva/sclera: Conjunctivae normal.     Pupils: Pupils are equal, round, and reactive to light.  Neck:     Thyroid: No thyromegaly.     Vascular: No JVD.  Cardiovascular:     Rate and Rhythm: Normal rate and regular rhythm.     Heart sounds: Normal heart sounds. No murmur heard.   No friction rub. No gallop.  Pulmonary:     Effort: Pulmonary effort is normal. No respiratory distress.     Breath sounds: Normal breath sounds. No wheezing or rales.  Chest:     Chest wall: No tenderness.  Abdominal:     General: Bowel sounds are normal. There is no distension.     Palpations: Abdomen is soft. There is no mass.     Tenderness: There is no abdominal tenderness. There is no guarding or rebound.  Musculoskeletal:        General: No tenderness. Normal range of motion.     Cervical back: Normal range of motion.  Lymphadenopathy:     Cervical: No cervical adenopathy.  Skin:    General: Skin is warm and dry.     Findings: No rash.  Neurological:     Mental Status: He is alert and oriented to person, place, and time.     Cranial Nerves: No cranial nerve deficit.     Motor: No  abnormal muscle tone.     Coordination: Coordination normal.     Gait: Gait normal.     Deep Tendon Reflexes: Reflexes are normal and symmetric.  Psychiatric:        Behavior: Behavior normal.        Thought Content: Thought content normal.        Judgment: Judgment normal.    Lab Results  Component Value Date   WBC 6.6 10/01/2021  HGB 16.3 10/01/2021   HCT 48.4 10/01/2021   PLT 162.0 10/01/2021   GLUCOSE 86 10/01/2021   CHOL 260 (H) 03/22/2021   TRIG 263.0 (H) 03/22/2021   HDL 41.30 03/22/2021   LDLDIRECT 191.0 03/22/2021   LDLCALC 96 02/10/2020   ALT 15 10/01/2021   AST 17 10/01/2021   NA 138 10/01/2021   K 4.6 10/01/2021   CL 101 10/01/2021   CREATININE 1.01 10/01/2021   BUN 13 10/01/2021   CO2 27 10/01/2021   TSH 1.58 10/01/2021   PSA 0.85 10/01/2021   INR 1.01 03/28/2012   HGBA1C 5.3 10/01/2021    No results found.  Assessment & Plan:   Problem List Items Addressed This Visit     HTN (hypertension)    Discont Norvasc and Irbesartan due to low BP after wt loss      Obesity    Much better. Increase Mounjaro.       Relevant Medications   tirzepatide (MOUNJARO) 7.5 MG/0.5ML Pen   Low back pain    LBP - R flank - worse w/ROM. CT - no urinary obstruction. Norco as needed rare use  Potential benefits of a long term opioids use as well as potential risks (i.e. addiction risk, apnea etc) and complications (i.e. Somnolence, constipation and others) were explained to the patient and were aknowledged. Indocin - rare use      Relevant Medications   HYDROcodone-acetaminophen (NORCO) 7.5-325 MG tablet   indomethacin (INDOCIN) 50 MG capsule   Hypogonadism male    Cont on shots   Potential benefits of a long term sex steroid  use as well as potential risks  and complications were explained to the patient and were aknowledged - OSA, MI etc.          Meds ordered this encounter  Medications   HYDROcodone-acetaminophen (NORCO) 7.5-325 MG tablet    Sig:  Take 0.5-1 tablets by mouth every 4 (four) hours as needed for moderate pain or severe pain.    Dispense:  30 tablet    Refill:  0   indomethacin (INDOCIN) 50 MG capsule    Sig: TAKE 1 CAPSULE 3 TIMES DAILY AS NEEDED FOR MODERATE PAIN. OFFICE VISIT NEEDED BEFORE REFILLS    Dispense:  30 capsule    Refill:  1   tirzepatide (MOUNJARO) 7.5 MG/0.5ML Pen    Sig: Inject 7.5 mg into the skin once a week.    Dispense:  6 mL    Refill:  3      Follow-up: Return in about 3 months (around 01/04/2022) for a follow-up visit.  Walker Kehr, MD

## 2021-10-05 MED ORDER — HYDROCODONE-ACETAMINOPHEN 7.5-325 MG PO TABS: 0.5000 | ORAL_TABLET | ORAL | 0 refills | Status: DC | PRN

## 2021-10-05 NOTE — Telephone Encounter (Signed)
Ok Done Thx 

## 2021-10-05 NOTE — Telephone Encounter (Signed)
I called pt to let him know the Rx was sent in to [facility]

## 2021-10-08 ENCOUNTER — Other Ambulatory Visit: Payer: Self-pay | Admitting: Internal Medicine

## 2021-10-13 ENCOUNTER — Ambulatory Visit (AMBULATORY_SURGERY_CENTER): Payer: Self-pay | Admitting: *Deleted

## 2021-10-13 VITALS — Ht 68.0 in | Wt 242.0 lb

## 2021-10-13 DIAGNOSIS — R195 Other fecal abnormalities: Secondary | ICD-10-CM

## 2021-10-13 MED ORDER — SUTAB 1479-225-188 MG PO TABS: 1.0000 | ORAL_TABLET | ORAL | 0 refills | Status: DC

## 2021-10-13 NOTE — Progress Notes (Signed)
Patient is here in-person for PV. Patient denies any allergies to eggs or soy. Patient denies any problems with anesthesia/sedation. Patient is not on any oxygen at home. Patient is not taking any diet/weight loss medications or blood thinners. Patient is aware of our care-partner policy.patient request sutab-coupon given to pt. Pt denies constipation.   EMMI education assigned to the patient for the procedure, sent to [place].

## 2021-10-31 ENCOUNTER — Encounter: Payer: Self-pay | Admitting: Certified Registered Nurse Anesthetist

## 2021-11-01 ENCOUNTER — Encounter: Payer: Self-pay | Admitting: Gastroenterology

## 2021-11-03 ENCOUNTER — Encounter: Payer: Self-pay | Admitting: Gastroenterology

## 2021-11-03 ENCOUNTER — Ambulatory Visit (AMBULATORY_SURGERY_CENTER): Payer: 59 | Admitting: Gastroenterology

## 2021-11-03 VITALS — BP 124/79 | HR 59 | Temp 96.9°F | Resp 12 | Ht 68.0 in | Wt 242.0 lb

## 2021-11-03 DIAGNOSIS — K5730 Diverticulosis of large intestine without perforation or abscess without bleeding: Secondary | ICD-10-CM | POA: Diagnosis not present

## 2021-11-03 DIAGNOSIS — D120 Benign neoplasm of cecum: Secondary | ICD-10-CM

## 2021-11-03 DIAGNOSIS — D123 Benign neoplasm of transverse colon: Secondary | ICD-10-CM

## 2021-11-03 DIAGNOSIS — R195 Other fecal abnormalities: Secondary | ICD-10-CM | POA: Diagnosis present

## 2021-11-03 DIAGNOSIS — K635 Polyp of colon: Secondary | ICD-10-CM | POA: Diagnosis not present

## 2021-11-03 DIAGNOSIS — K648 Other hemorrhoids: Secondary | ICD-10-CM

## 2021-11-03 MED ORDER — SODIUM CHLORIDE 0.9 % IV SOLN: 500.0000 mL | Freq: Once | INTRAVENOUS | Status: DC

## 2021-11-03 NOTE — Patient Instructions (Signed)
YOU HAD AN ENDOSCOPIC PROCEDURE TODAY AT [facility]:   Refer to the procedure report that was given to you for any specific questions about what was found during the examination.  If the procedure report does not answer your questions, please call your gastroenterologist to clarify.  If you requested that your care partner not be given the details of your procedure findings, then the procedure report has been included in a sealed envelope for you to review at your convenience later.  YOU SHOULD EXPECT: Some feelings of bloating in the abdomen. Passage of more gas than usual.  Walking can help get rid of the air that was put into your GI tract during the procedure and reduce the bloating. If you had a lower endoscopy (such as a colonoscopy or flexible sigmoidoscopy) you may notice spotting of blood in your stool or on the toilet paper. If you underwent a bowel prep for your procedure, you may not have a normal bowel movement for a few days.  Please Note:  You might notice some irritation and congestion in your nose or some drainage.  This is from the oxygen used during your procedure.  There is no need for concern and it should clear up in a day or so.  SYMPTOMS TO REPORT IMMEDIATELY:  Following lower endoscopy (colonoscopy or flexible sigmoidoscopy):  Excessive amounts of blood in the stool  Significant tenderness or worsening of abdominal pains  Swelling of the abdomen that is new, acute  Fever of 100F or higher  Following upper endoscopy (EGD)  Vomiting of blood or coffee ground material  New chest pain or pain under the shoulder blades  Painful or persistently difficult swallowing  New shortness of breath  Fever of 100F or higher  Black, tarry-looking stools  For urgent or emergent issues, a gastroenterologist can be reached at any hour by calling [phone]. Do not use MyChart messaging for urgent concerns.    DIET:  We do recommend a small meal at first, but  then you may proceed to your regular diet.  Drink plenty of fluids but you should avoid alcoholic beverages for 24 hours.  ACTIVITY:  You should plan to take it easy for the rest of today and you should NOT DRIVE or use heavy machinery until tomorrow (because of the sedation medicines used during the test).    FOLLOW UP: Our staff will call the number listed on your records 24-72 hours following your procedure to check on you and address any questions or concerns that you may have regarding the information given to you following your procedure. If we do not reach you, we will leave a message.  We will attempt to reach you two times.  During this call, we will ask if you have developed any symptoms of COVID 19. If you develop any symptoms (ie: fever, flu-like symptoms, shortness of breath, cough etc.) before then, please call [phone].  If you test positive for Covid 19 in the 2 weeks post procedure, please call and report this information to [place].    If any biopsies were taken you will be contacted by phone or by letter within the next 1-3 weeks.  Please call us at [phone] if you have not heard about the biopsies in 3 weeks.    SIGNATURES/CONFIDENTIALITY: You and/or your care partner have signed paperwork which will be entered into your electronic medical record.  These signatures attest to the fact that that the information above on your After  Visit Summary has been reviewed and is understood.  Full responsibility of the confidentiality of this discharge information lies with you and/or your care-partner.

## 2021-11-03 NOTE — Progress Notes (Signed)
Report given to PACU, vss 

## 2021-11-03 NOTE — Op Note (Signed)
[place] Patient Name: Christian [name] Procedure Date: 11/03/2021 7:57 AM MRN: [id number] Endoscopist: Mallie Mussel L. Loletha Carrow , MD Age: 61 Referring MD:  Date of Birth: 01/25/1961 Gender: Male Account #: [id number] Procedure:                Colonoscopy Indications:              Positive Cologuard test Medicines:                Monitored Anesthesia Care Procedure:                Pre-Anesthesia Assessment:                           - Prior to the procedure, a History and Physical                            was performed, and patient medications and                            allergies were reviewed. The patient's tolerance of                            previous anesthesia was also reviewed. The risks                            and benefits of the procedure and the sedation                            options and risks were discussed with the patient.                            All questions were answered, and informed consent                            was obtained. Prior Anticoagulants: The patient has                            taken no previous anticoagulant or antiplatelet                            agents. ASA Grade Assessment: III - A patient with                            severe systemic disease. After reviewing the risks                            and benefits, the patient was deemed in                            satisfactory condition to undergo the procedure.                           After obtaining informed consent, the colonoscope  was passed under direct vision. Throughout the                            procedure, the patient's blood pressure, pulse, and                            oxygen saturations were monitored continuously. The                            CF [id number] #[id number] was introduced through the anus                            and advanced to the the cecum, identified by                            appendiceal orifice and ileocecal  valve. The                            colonoscopy was performed without difficulty. The                            patient tolerated the procedure well. The quality                            of the bowel preparation was good after lavage. The                            ileocecal valve, appendiceal orifice, and rectum                            were photographed. The bowel preparation used was                            SUPREP. Scope In: 8:15:20 AM Scope Out: 8:31:23 AM Scope Withdrawal Time: 0 hours 13 minutes 1 second  Total Procedure Duration: 0 hours 16 minutes 3 seconds  Findings:                 The perianal and digital rectal examinations were                            normal.                           Repeat examination of right colon under NBI                            performed.                           Multiple diverticula were found in the left colon                            and right colon.  A 5 mm polyp was found in the ileocecal valve. The                            polyp was sessile. The polyp was removed with a                            cold snare. Resection and retrieval were complete.                           An 8-10 mm polyp was found in the mid transverse                            colon. The polyp was pedunculated. The polyp was                            removed with a hot snare. Resection and retrieval                            were complete. (removed en bloc, then cut with cold                            snare for retrieval)                           Internal hemorrhoids were found. The hemorrhoids                            were small.                           The exam was otherwise without abnormality on                            direct and retroflexion views. Complications:            No immediate complications. Estimated Blood Loss:     Estimated blood loss was minimal. Impression:               - Diverticulosis in the  left colon and in the right                            colon.                           - One 5 mm polyp at the ileocecal valve, removed                            with a cold snare. Resected and retrieved.                           - One 8-10 mm polyp in the mid transverse colon,                            removed with a hot snare. Resected and retrieved.                           -  Internal hemorrhoids.                           - The examination was otherwise normal on direct                            and retroflexion views. Recommendation:           - Patient has a contact number available for                            emergencies. The signs and symptoms of potential                            delayed complications were discussed with the                            patient. Return to normal activities tomorrow.                            Written discharge instructions were provided to the                            patient.                           - Resume previous diet.                           - Continue present medications.                           - Await pathology results.                           - Repeat colonoscopy is recommended for                            surveillance. The colonoscopy date will be                            determined after pathology results from today's                            exam become available for review. Julietta Batterman L. Loletha Carrow, MD 11/03/2021 8:37:03 AM This report has been signed electronically.
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
## 2021-11-03 NOTE — Progress Notes (Signed)
Pt's states no medical or surgical changes since previsit or office visit. 

## 2021-11-03 NOTE — Progress Notes (Signed)
Called to room to assist during endoscopic procedure.  Patient ID and intended procedure confirmed with present staff. Received instructions for my participation in the procedure from the performing physician.  

## 2021-11-04 ENCOUNTER — Telehealth: Payer: Self-pay | Admitting: *Deleted

## 2021-11-04 NOTE — Telephone Encounter (Signed)
  Follow up Call-     11/03/2021    7:11 AM  Call back number  Post procedure Call Back phone  # [phone]  Permission to leave phone message Yes     Patient questions:  Do you have a fever, pain , or abdominal swelling? No. Pain Score  0 *  Have you tolerated food without any problems? Yes.    Have you been able to return to your normal activities? Yes.    Do you have any questions about your discharge instructions: Diet   No. Medications  No. Follow up visit  No.  Do you have questions or concerns about your Care? No.  Actions: * If pain score is 4 or above: No action needed, pain <4.

## 2021-11-05 ENCOUNTER — Encounter: Payer: Self-pay | Admitting: Gastroenterology

## 2021-11-30 ENCOUNTER — Telehealth: Payer: Self-pay | Admitting: Internal Medicine

## 2021-11-30 NOTE — Telephone Encounter (Signed)
Caller & Relationship to patient: Jaekwon Mcclune  Call back number: [phone]  Date of last office visit: 10/04/21  Date of next office visit: 01/04/22  Medication(s) to be refilled: HYDROcodone-acetaminophen ([place]) 7.5-325 MG tablet   Preferred Pharmacy:  [facility], [place] - [address] OF [place] [address] Phone:  [phone]  Fax:  [phone]

## 2021-12-01 ENCOUNTER — Telehealth: Payer: Self-pay | Admitting: Internal Medicine

## 2021-12-01 MED ORDER — HYDROCODONE-ACETAMINOPHEN 7.5-325 MG PO TABS
0.5000 | ORAL_TABLET | ORAL | 0 refills | Status: DC | PRN
Start: 2021-12-01 — End: 2021-12-02

## 2021-12-01 NOTE — Telephone Encounter (Signed)
Pt stated [facility] use [facility]  Pt requesting meds be sent to  [id number] - [place],  - [address] [place] [address] [place] [address] [place] [address] Phone:  [phone]  Fax:  [phone]

## 2021-12-01 NOTE — Telephone Encounter (Signed)
Okay.  Keep return office visit.  Thanks

## 2021-12-01 NOTE — Telephone Encounter (Signed)
Notified pt rx sent to [facility].

## 2021-12-02 MED ORDER — HYDROCODONE-ACETAMINOPHEN 7.5-325 MG PO TABS
0.5000 | ORAL_TABLET | ORAL | 0 refills | Status: DC | PRN
Start: 2021-12-02 — End: 2022-01-04

## 2021-12-02 NOTE — Telephone Encounter (Signed)
Pt wife came up here. [facility] was [facility], Wanting script to go to [facility] instead.Marland KitchenJohny [name]

## 2021-12-03 NOTE — Telephone Encounter (Signed)
Yes.. wife came at lunch on yesterday and was given written script.Marland KitchenJohny [name]

## 2021-12-03 NOTE — Telephone Encounter (Signed)
It has been taking care of, I believe.  Thanks

## 2021-12-28 ENCOUNTER — Ambulatory Visit (INDEPENDENT_AMBULATORY_CARE_PROVIDER_SITE_OTHER): Payer: 59 | Admitting: Urology

## 2021-12-28 ENCOUNTER — Encounter: Payer: Self-pay | Admitting: Urology

## 2021-12-28 VITALS — BP 136/79 | HR 71

## 2021-12-28 DIAGNOSIS — Z8551 Personal history of malignant neoplasm of bladder: Secondary | ICD-10-CM | POA: Diagnosis not present

## 2021-12-28 DIAGNOSIS — D494 Neoplasm of unspecified behavior of bladder: Secondary | ICD-10-CM

## 2021-12-28 MED ORDER — CIPROFLOXACIN HCL 500 MG PO TABS
500.0000 mg | ORAL_TABLET | Freq: Once | ORAL | Status: AC
  Administered 2021-12-28: 500 mg via ORAL

## 2021-12-28 NOTE — Patient Instructions (Signed)

## 2021-12-28 NOTE — Progress Notes (Signed)
   12/28/21  CC: followup bladder cancer  HPI: Christian [name] is a 61yo here for followup for low grade bladder cancer Blood pressure 136/79, pulse 71. NED. A&Ox3.   No respiratory distress   Abd soft, NT, ND Normal phallus with bilateral descended testicles  Cystoscopy Procedure Note  Patient identification was confirmed, informed consent was obtained, and patient was prepped using Betadine solution.  Lidocaine jelly was administered per urethral meatus.     Pre-Procedure: - Inspection reveals a normal caliber ureteral meatus.  Procedure: The flexible cystoscope was introduced without difficulty - No urethral strictures/lesions are present. - Enlarged prostate  - Normal bladder neck - Bilateral ureteral orifices identified - Bladder mucosa  reveals no ulcers, tumors, or lesions - No bladder stones - No trabeculation   Post-Procedure: - Patient tolerated the procedure well  Assessment/ Plan: RTC 6 months for cystoscopy  No follow-ups on file.  [name], MD

## 2021-12-30 LAB — URINALYSIS, ROUTINE W REFLEX MICROSCOPIC
Bilirubin, UA: NEGATIVE
Glucose, UA: NEGATIVE
Leukocytes,UA: NEGATIVE
Nitrite, UA: NEGATIVE
Protein,UA: NEGATIVE
RBC, UA: NEGATIVE
Specific Gravity, UA: 1.02 (ref 1.005–1.030)
Urobilinogen, Ur: 0.2 mg/dL (ref 0.2–1.0)
pH, UA: 5.5 (ref 5.0–7.5)

## 2022-01-04 ENCOUNTER — Encounter: Payer: Self-pay | Admitting: Internal Medicine

## 2022-01-04 ENCOUNTER — Ambulatory Visit (INDEPENDENT_AMBULATORY_CARE_PROVIDER_SITE_OTHER): Payer: 59 | Admitting: Internal Medicine

## 2022-01-04 VITALS — BP 130/64 | HR 65 | Temp 98.1°F | Ht 68.0 in | Wt 207.0 lb

## 2022-01-04 DIAGNOSIS — E6609 Other obesity due to excess calories: Secondary | ICD-10-CM

## 2022-01-04 DIAGNOSIS — Z6830 Body mass index (BMI) 30.0-30.9, adult: Secondary | ICD-10-CM

## 2022-01-04 DIAGNOSIS — C670 Malignant neoplasm of trigone of bladder: Secondary | ICD-10-CM

## 2022-01-04 DIAGNOSIS — E669 Obesity, unspecified: Secondary | ICD-10-CM

## 2022-01-04 DIAGNOSIS — E1169 Type 2 diabetes mellitus with other specified complication: Secondary | ICD-10-CM

## 2022-01-04 DIAGNOSIS — I10 Essential (primary) hypertension: Secondary | ICD-10-CM

## 2022-01-04 MED ORDER — TESTOSTERONE CYPIONATE 200 MG/ML IM SOLN: 200.0000 mg | INTRAMUSCULAR | 5 refills | Status: DC

## 2022-01-04 MED ORDER — HYDROCODONE-ACETAMINOPHEN 7.5-325 MG PO TABS
0.5000 | ORAL_TABLET | ORAL | 0 refills | Status: DC | PRN
Start: 2022-01-04 — End: 2022-04-06

## 2022-01-04 NOTE — Assessment & Plan Note (Signed)
8/23 On Mounjaro BMI 31

## 2022-01-04 NOTE — Assessment & Plan Note (Signed)
F/u w/Urology - s/p recent nl cystoscopy

## 2022-01-04 NOTE — Progress Notes (Signed)
Subjective:  Patient ID: Christian [name], male    DOB: Jul 01, 1960  Age: 61 y.o. MRN: [id number]  CC: Follow-up (3 month f/u)   HPI Christian [name] presents for DM, LBP, CAD  Outpatient Medications Prior to Visit  Medication Sig Dispense Refill   acetaminophen (TYLENOL) 500 MG tablet Take 1,000 mg by mouth every 8 (eight) hours as needed for moderate pain.     aspirin EC 81 MG tablet Take 81 mg by mouth daily. Swallow whole.     atorvastatin (LIPITOR) 20 MG tablet TAKE 1 TABLET BY MOUTH EVERY DAY 90 tablet 3   clonazePAM (KLONOPIN) 1 MG tablet TAKE 1 TABLET BY MOUTH TWICE A DAY AS NEEDED FOR ANXIETY 60 tablet 1   fenofibrate (TRICOR) 145 MG tablet TAKE 1 TABLET BY MOUTH EVERY DAY 90 tablet 3   indomethacin (INDOCIN) 50 MG capsule TAKE 1 CAPSULE 3 TIMES DAILY AS NEEDED FOR MODERATE PAIN. 30 capsule 1   tirzepatide (MOUNJARO) 7.5 MG/0.5ML Pen Inject 7.5 mg into the skin once a week. (Patient taking differently: Inject 7.5 mg into the skin once a week. Friday) 6 mL 3   umeclidinium-vilanterol (ANORO ELLIPTA) 62.5-25 MCG/INH AEPB Inhale 1 puff into the lungs daily. 60 each 2   HYDROcodone-acetaminophen (NORCO) 7.5-325 MG tablet Take 0.5-1 tablets by mouth every 4 (four) hours as needed for moderate pain or severe pain. 30 tablet 0   testosterone cypionate (DEPOTESTOSTERONE CYPIONATE) 200 MG/ML injection Inject 1 mL (200 mg total) into the muscle every 14 (fourteen) days. 10 mL 5   albuterol (VENTOLIN HFA) 108 (90 Base) MCG/ACT inhaler Inhale 2 puffs into the lungs every 6 (six) hours as needed for wheezing or shortness of breath. (Patient not taking: Reported on 10/13/2021) 8 g 5   Colchicine 0.6 MG CAPS TAKE 1 CAPSULE TWICE DAILY FOR GOUT ATTACK (Patient not taking: Reported on 10/13/2021) 90 capsule 1   No facility-administered medications prior to visit.    ROS: Review of Systems  Constitutional:  Negative for appetite change, fatigue and unexpected weight change.  HENT:  Negative for  congestion, nosebleeds, sneezing, sore throat and trouble swallowing.   Eyes:  Negative for itching and visual disturbance.  Respiratory:  Negative for cough.   Cardiovascular:  Negative for chest pain, palpitations and leg swelling.  Gastrointestinal:  Negative for abdominal distention, blood in stool, diarrhea and nausea.  Genitourinary:  Negative for frequency and hematuria.  Musculoskeletal:  Negative for back pain, gait problem, joint swelling and neck pain.  Skin:  Negative for rash.  Neurological:  Negative for dizziness, tremors, speech difficulty and weakness.  Psychiatric/Behavioral:  Negative for agitation, dysphoric mood and sleep disturbance. The patient is not nervous/anxious.     Objective:  BP 130/64 (BP Location: Left Arm)   Pulse 65   Temp 98.1 F (36.7 C) (Oral)   Ht '5\' 8"'$  (1.727 m)   Wt 207 lb (93.9 kg)   SpO2 98%   BMI 31.47 kg/m   BP Readings from Last 3 Encounters:  01/04/22 130/64  12/28/21 136/79  11/03/21 124/79    Wt Readings from Last 3 Encounters:  01/04/22 207 lb (93.9 kg)  11/03/21 242 lb (109.8 kg)  10/13/21 242 lb (109.8 kg)    Physical Exam Constitutional:      General: He is not in acute distress.    Appearance: He is well-developed. He is obese.     Comments: NAD  Eyes:     Conjunctiva/sclera: Conjunctivae normal.  Pupils: Pupils are equal, round, and reactive to light.  Neck:     Thyroid: No thyromegaly.     Vascular: No JVD.  Cardiovascular:     Rate and Rhythm: Normal rate and regular rhythm.     Heart sounds: Normal heart sounds. No murmur heard.    No friction rub. No gallop.  Pulmonary:     Effort: Pulmonary effort is normal. No respiratory distress.     Breath sounds: Normal breath sounds. No wheezing or rales.  Chest:     Chest wall: No tenderness.  Abdominal:     General: Bowel sounds are normal. There is no distension.     Palpations: Abdomen is soft. There is no mass.     Tenderness: There is no abdominal  tenderness. There is no guarding or rebound.  Musculoskeletal:        General: Tenderness present. Normal range of motion.     Cervical back: Normal range of motion.  Lymphadenopathy:     Cervical: No cervical adenopathy.  Skin:    General: Skin is warm and dry.     Findings: No rash.  Neurological:     Mental Status: He is alert and oriented to person, place, and time.     Cranial Nerves: No cranial nerve deficit.     Motor: No abnormal muscle tone.     Coordination: Coordination normal.     Gait: Gait normal.     Deep Tendon Reflexes: Reflexes are normal and symmetric.  Psychiatric:        Behavior: Behavior normal.        Thought Content: Thought content normal.        Judgment: Judgment normal.     Lab Results  Component Value Date   WBC 6.6 10/01/2021   HGB 16.3 10/01/2021   HCT 48.4 10/01/2021   PLT 162.0 10/01/2021   GLUCOSE 86 10/01/2021   CHOL 260 (H) 03/22/2021   TRIG 263.0 (H) 03/22/2021   HDL 41.30 03/22/2021   LDLDIRECT 191.0 03/22/2021   LDLCALC 96 02/10/2020   ALT 15 10/01/2021   AST 17 10/01/2021   NA 138 10/01/2021   K 4.6 10/01/2021   CL 101 10/01/2021   CREATININE 1.01 10/01/2021   BUN 13 10/01/2021   CO2 27 10/01/2021   TSH 1.58 10/01/2021   PSA 0.85 10/01/2021   INR 1.01 03/28/2012   HGBA1C 5.3 10/01/2021    No results found.  Assessment & Plan:   Problem List Items Addressed This Visit     Bladder cancer ([place])    F/u w/Urology - s/p recent nl cystoscopy      HTN (hypertension)    NAS Off Rx 8/23      Relevant Orders   Comprehensive metabolic panel   Hemoglobin A1c   TSH   Obesity    8/23 On Mounjaro BMI 31      Other Visit Diagnoses     Diabetes mellitus type 2 in obese ([place])    -  Primary   Relevant Orders   Hemoglobin A1c         Meds ordered this encounter  Medications   HYDROcodone-acetaminophen (NORCO) 7.5-325 MG tablet    Sig: Take 0.5-1 tablets by mouth every 4 (four) hours as needed for moderate pain  or severe pain.    Dispense:  30 tablet    Refill:  0   testosterone cypionate (DEPOTESTOSTERONE CYPIONATE) 200 MG/ML injection    Sig: Inject 1 mL (200 mg total)  into the muscle every 14 (fourteen) days.    Dispense:  10 mL    Refill:  5      Follow-up: Return in about 3 months (around 04/06/2022) for a follow-up visit.  Walker Kehr, MD

## 2022-01-04 NOTE — Assessment & Plan Note (Signed)
NAS Off Rx 8/23

## 2022-02-11 ENCOUNTER — Telehealth: Payer: Self-pay | Admitting: Internal Medicine

## 2022-02-11 NOTE — Telephone Encounter (Signed)
Patient wants to know if his Mounjaro dosage can be increased - please advise.  Patient starts feeling hungry 3 or 4 days before his next dose.

## 2022-02-15 MED ORDER — TIRZEPATIDE 10 MG/0.5ML ~~LOC~~ SOAJ: 10.0000 mg | SUBCUTANEOUS | 3 refills | Status: DC

## 2022-02-15 NOTE — Telephone Encounter (Signed)
Notified pt MD sent new rx to [facility].Marland KitchenJohny Chess

## 2022-02-15 NOTE — Telephone Encounter (Signed)
Okay.  Done.  Thanks 

## 2022-04-05 ENCOUNTER — Other Ambulatory Visit (INDEPENDENT_AMBULATORY_CARE_PROVIDER_SITE_OTHER): Payer: 59

## 2022-04-05 DIAGNOSIS — I10 Essential (primary) hypertension: Secondary | ICD-10-CM | POA: Diagnosis not present

## 2022-04-05 DIAGNOSIS — E1169 Type 2 diabetes mellitus with other specified complication: Secondary | ICD-10-CM

## 2022-04-05 DIAGNOSIS — E669 Obesity, unspecified: Secondary | ICD-10-CM | POA: Diagnosis not present

## 2022-04-05 LAB — COMPREHENSIVE METABOLIC PANEL
ALT: 14 U/L (ref 0–53)
AST: 22 U/L (ref 0–37)
Albumin: 4.5 g/dL (ref 3.5–5.2)
Alkaline Phosphatase: 49 U/L (ref 39–117)
BUN: 16 mg/dL (ref 6–23)
CO2: 28 mEq/L (ref 19–32)
Calcium: 9.4 mg/dL (ref 8.4–10.5)
Chloride: 102 mEq/L (ref 96–112)
Creatinine, Ser: 1.04 mg/dL (ref 0.40–1.50)
GFR: 78.65 mL/min (ref >60.00)
Glucose, Bld: 84 mg/dL (ref 70–99)
Potassium: 4.3 mEq/L (ref 3.5–5.1)
Sodium: 136 mEq/L (ref 135–145)
Total Bilirubin: 0.8 mg/dL (ref 0.2–1.2)
Total Protein: 6.8 g/dL (ref 6.0–8.3)

## 2022-04-05 LAB — TSH: TSH: 1.4 u[IU]/mL (ref 0.35–5.50)

## 2022-04-05 LAB — HEMOGLOBIN A1C: Hgb A1c MFr Bld: 5.1 % (ref 4.6–6.5)

## 2022-04-06 ENCOUNTER — Ambulatory Visit (INDEPENDENT_AMBULATORY_CARE_PROVIDER_SITE_OTHER): Payer: 59 | Admitting: Internal Medicine

## 2022-04-06 ENCOUNTER — Encounter: Payer: Self-pay | Admitting: Internal Medicine

## 2022-04-06 VITALS — BP 120/84 | HR 61 | Temp 98.3°F | Ht 68.0 in | Wt 198.2 lb

## 2022-04-06 DIAGNOSIS — M109 Gout, unspecified: Secondary | ICD-10-CM

## 2022-04-06 DIAGNOSIS — I10 Essential (primary) hypertension: Secondary | ICD-10-CM

## 2022-04-06 DIAGNOSIS — E7849 Other hyperlipidemia: Secondary | ICD-10-CM

## 2022-04-06 DIAGNOSIS — E785 Hyperlipidemia, unspecified: Secondary | ICD-10-CM | POA: Diagnosis not present

## 2022-04-06 DIAGNOSIS — E291 Testicular hypofunction: Secondary | ICD-10-CM

## 2022-04-06 DIAGNOSIS — I2583 Coronary atherosclerosis due to lipid rich plaque: Secondary | ICD-10-CM

## 2022-04-06 DIAGNOSIS — I2510 Atherosclerotic heart disease of native coronary artery without angina pectoris: Secondary | ICD-10-CM | POA: Diagnosis not present

## 2022-04-06 DIAGNOSIS — M545 Low back pain, unspecified: Secondary | ICD-10-CM | POA: Diagnosis not present

## 2022-04-06 DIAGNOSIS — R739 Hyperglycemia, unspecified: Secondary | ICD-10-CM

## 2022-04-06 DIAGNOSIS — G8929 Other chronic pain: Secondary | ICD-10-CM

## 2022-04-06 MED ORDER — FENOFIBRATE 145 MG PO TABS: 145.0000 mg | ORAL_TABLET | Freq: Every day | ORAL | 3 refills | Status: DC

## 2022-04-06 MED ORDER — ATORVASTATIN CALCIUM 20 MG PO TABS: 20.0000 mg | ORAL_TABLET | Freq: Every day | ORAL | 3 refills | Status: DC

## 2022-04-06 MED ORDER — INDOMETHACIN 50 MG PO CAPS: ORAL_CAPSULE | ORAL | 1 refills | Status: DC

## 2022-04-06 MED ORDER — TIRZEPATIDE 10 MG/0.5ML ~~LOC~~ SOAJ
10.0000 mg | SUBCUTANEOUS | 3 refills | Status: DC
Start: 2022-04-06 — End: 2022-11-14

## 2022-04-06 MED ORDER — HYDROCODONE-ACETAMINOPHEN 7.5-325 MG PO TABS: 0.5000 | ORAL_TABLET | ORAL | 0 refills | Status: DC | PRN

## 2022-04-06 MED ORDER — TESTOSTERONE CYPIONATE 200 MG/ML IM SOLN: 200.0000 mg | INTRAMUSCULAR | 5 refills | Status: DC

## 2022-04-06 NOTE — Patient Instructions (Signed)
Read about Vit D3, K2, MK7

## 2022-04-06 NOTE — Assessment & Plan Note (Signed)
On shots 2023  Potential benefits of a long term sex steroid  use as well as potential risks  and complications were explained to the patient and were aknowledged - OSA, MI etc.

## 2022-04-06 NOTE — Assessment & Plan Note (Signed)
On [facility]

## 2022-04-06 NOTE — Assessment & Plan Note (Signed)
Norco as needed rare use  Potential benefits of a long term opioids use as well as potential risks (i.e. addiction risk, apnea etc) and complications (i.e. Somnolence, constipation and others) were explained to the patient and were aknowledged. Indocin - rare use

## 2022-04-06 NOTE — Assessment & Plan Note (Signed)
Monitor BP 

## 2022-04-06 NOTE — Assessment & Plan Note (Addendum)
Cont on Lipitor Off Fenofibrate Coronary calcium CT 11/20: Normal coronary origins with coronary calcifications in the RCA and LAD. Coronary calcium score of 61.  Read about Vit D3, K2, MK7

## 2022-04-06 NOTE — Assessment & Plan Note (Signed)
No relapdse

## 2022-04-06 NOTE — Assessment & Plan Note (Signed)
Cont on Lipitor, ASA 81 Check lipids

## 2022-04-06 NOTE — Progress Notes (Signed)
Subjective:  Patient ID: Christian [name], male    DOB: October 24, 1960  Age: 61 y.o. MRN: [id number]  CC: Follow-up (3 month f/u)   HPI Christian [name] presents for DM, CAD, dyslipidemia, OA, hypogonadism  Outpatient Medications Prior to Visit  Medication Sig Dispense Refill   acetaminophen (TYLENOL) 500 MG tablet Take 1,000 mg by mouth every 8 (eight) hours as needed for moderate pain.     aspirin EC 81 MG tablet Take 81 mg by mouth daily. Swallow whole.     atorvastatin (LIPITOR) 20 MG tablet TAKE 1 TABLET BY MOUTH EVERY DAY 90 tablet 3   clonazePAM (KLONOPIN) 1 MG tablet TAKE 1 TABLET BY MOUTH TWICE A DAY AS NEEDED FOR ANXIETY 60 tablet 1   fenofibrate (TRICOR) 145 MG tablet TAKE 1 TABLET BY MOUTH EVERY DAY 90 tablet 3   HYDROcodone-acetaminophen (NORCO) 7.5-325 MG tablet Take 0.5-1 tablets by mouth every 4 (four) hours as needed for moderate pain or severe pain. 30 tablet 0   indomethacin (INDOCIN) 50 MG capsule TAKE 1 CAPSULE 3 TIMES DAILY AS NEEDED FOR MODERATE PAIN. 30 capsule 1   testosterone cypionate (DEPOTESTOSTERONE CYPIONATE) 200 MG/ML injection Inject 1 mL (200 mg total) into the muscle every 14 (fourteen) days. 10 mL 5   umeclidinium-vilanterol (ANORO ELLIPTA) 62.5-25 MCG/INH AEPB Inhale 1 puff into the lungs daily. 60 each 2   tirzepatide (MOUNJARO) 10 MG/0.5ML Pen Inject 10 mg into the skin once a week. 6 mL 3   Colchicine 0.6 MG CAPS TAKE 1 CAPSULE TWICE DAILY FOR GOUT ATTACK (Patient not taking: Reported on 10/13/2021) 90 capsule 1   albuterol (VENTOLIN HFA) 108 (90 Base) MCG/ACT inhaler Inhale 2 puffs into the lungs every 6 (six) hours as needed for wheezing or shortness of breath. (Patient not taking: Reported on 10/13/2021) 8 g 5   No facility-administered medications prior to visit.    ROS: Review of Systems  Constitutional:  Negative for appetite change, fatigue and unexpected weight change.  HENT:  Negative for congestion, nosebleeds, sneezing, sore throat and  trouble swallowing.   Eyes:  Negative for itching and visual disturbance.  Respiratory:  Negative for cough.   Cardiovascular:  Negative for chest pain, palpitations and leg swelling.  Gastrointestinal:  Negative for abdominal distention, blood in stool, diarrhea and nausea.  Genitourinary:  Negative for frequency and hematuria.  Musculoskeletal:  Positive for arthralgias. Negative for back pain, gait problem, joint swelling and neck pain.  Skin:  Negative for rash.  Neurological:  Negative for dizziness, tremors, speech difficulty and weakness.  Psychiatric/Behavioral:  Negative for agitation, dysphoric mood and sleep disturbance. The patient is not nervous/anxious.     Objective:  BP 120/84 (BP Location: Left Arm)   Pulse 61   Temp 98.3 F (36.8 C) (Oral)   Ht '5\' 8"'$  (1.727 m)   Wt 198 lb 3.2 oz (89.9 kg)   SpO2 96%   BMI 30.14 kg/m   BP Readings from Last 3 Encounters:  04/06/22 120/84  01/04/22 130/64  12/28/21 136/79    Wt Readings from Last 3 Encounters:  04/06/22 198 lb 3.2 oz (89.9 kg)  01/04/22 207 lb (93.9 kg)  11/03/21 242 lb (109.8 kg)    Physical Exam Constitutional:      General: He is not in acute distress.    Appearance: He is well-developed.     Comments: NAD  Eyes:     Conjunctiva/sclera: Conjunctivae normal.     Pupils: Pupils are equal, round,  and reactive to light.  Neck:     Thyroid: No thyromegaly.     Vascular: No JVD.  Cardiovascular:     Rate and Rhythm: Normal rate and regular rhythm.     Heart sounds: Normal heart sounds. No murmur heard.    No friction rub. No gallop.  Pulmonary:     Effort: Pulmonary effort is normal. No respiratory distress.     Breath sounds: Normal breath sounds. No wheezing or rales.  Chest:     Chest wall: No tenderness.  Abdominal:     General: Bowel sounds are normal. There is no distension.     Palpations: Abdomen is soft. There is no mass.     Tenderness: There is no abdominal tenderness. There is no  guarding or rebound.  Musculoskeletal:        General: No tenderness. Normal range of motion.     Cervical back: Normal range of motion.  Lymphadenopathy:     Cervical: No cervical adenopathy.  Skin:    General: Skin is warm and dry.     Findings: No rash.  Neurological:     Mental Status: He is alert and oriented to person, place, and time.     Cranial Nerves: No cranial nerve deficit.     Motor: No abnormal muscle tone.     Coordination: Coordination normal.     Gait: Gait normal.     Deep Tendon Reflexes: Reflexes are normal and symmetric.  Psychiatric:        Behavior: Behavior normal.        Thought Content: Thought content normal.        Judgment: Judgment normal.     Lab Results  Component Value Date   WBC 6.6 10/01/2021   HGB 16.3 10/01/2021   HCT 48.4 10/01/2021   PLT 162.0 10/01/2021   GLUCOSE 84 04/05/2022   CHOL 260 (H) 03/22/2021   TRIG 263.0 (H) 03/22/2021   HDL 41.30 03/22/2021   LDLDIRECT 191.0 03/22/2021   LDLCALC 96 02/10/2020   ALT 14 04/05/2022   AST 22 04/05/2022   NA 136 04/05/2022   K 4.3 04/05/2022   CL 102 04/05/2022   CREATININE 1.04 04/05/2022   BUN 16 04/05/2022   CO2 28 04/05/2022   TSH 1.40 04/05/2022   PSA 0.85 10/01/2021   INR 1.01 03/28/2012   HGBA1C 5.1 04/05/2022    No results found.  Assessment & Plan:   Problem List Items Addressed This Visit     Low back pain    Norco as needed rare use  Potential benefits of a long term opioids use as well as potential risks (i.e. addiction risk, apnea etc) and complications (i.e. Somnolence, constipation and others) were explained to the patient and were aknowledged. Indocin - rare use      Hypogonadism male    On shots 2023  Potential benefits of a long term sex steroid  use as well as potential risks  and complications were explained to the patient and were aknowledged - OSA, MI etc.      Hyperglycemia    On Mounjaro      HTN (hypertension)    Monitor BP      Gout     No relapdse      Dyslipidemia    Cont on Lipitor Off Fenofibrate Coronary calcium CT 11/20: Normal coronary origins with coronary calcifications in the RCA and LAD. Coronary calcium score of 61.  Read about Vit D3, K2, MK7  Relevant Orders   Hemoglobin A1c   Lipid panel   Testosterone   PSA   Comprehensive metabolic panel   Coronary artery disease - Primary    Cont on Lipitor, ASA 81 Check lipids      Relevant Orders   Hemoglobin A1c   Lipid panel   Testosterone   PSA   Comprehensive metabolic panel      Meds ordered this encounter  Medications   tirzepatide (MOUNJARO) 10 MG/0.5ML Pen    Sig: Inject 10 mg into the skin once a week.    Dispense:  6 mL    Refill:  3      Follow-up: No follow-ups on file.  Walker Kehr, MD

## 2022-04-27 ENCOUNTER — Ambulatory Visit: Payer: 59 | Admitting: Urology

## 2022-06-29 ENCOUNTER — Telehealth: Payer: Self-pay | Admitting: Internal Medicine

## 2022-06-29 ENCOUNTER — Other Ambulatory Visit: Payer: 59 | Admitting: Urology

## 2022-06-29 NOTE — Telephone Encounter (Signed)
Patient called requesting a refill on the following medications clonazePAM (KLONOPIN) 1 MG tablet and HYDROcodone-acetaminophen (NORCO) 7.5-325 MG tablet. Patient is requesting this refill be sent to his pharmacy on file  [place], [place] - [address]  . Best callback number for patient is [phone].

## 2022-06-30 MED ORDER — HYDROCODONE-ACETAMINOPHEN 7.5-325 MG PO TABS: 0.5000 | ORAL_TABLET | ORAL | 0 refills | Status: DC | PRN

## 2022-06-30 MED ORDER — CLONAZEPAM 1 MG PO TABS: ORAL_TABLET | ORAL | 1 refills | Status: DC

## 2022-06-30 NOTE — Telephone Encounter (Signed)
Notified pt MD sent refills this am../lmb

## 2022-06-30 NOTE — Telephone Encounter (Signed)
Okay.  Thanks.

## 2022-07-11 ENCOUNTER — Ambulatory Visit: Payer: 59 | Admitting: Internal Medicine

## 2022-07-25 ENCOUNTER — Other Ambulatory Visit: Payer: 59 | Admitting: Urology

## 2022-08-08 ENCOUNTER — Ambulatory Visit: Payer: 59 | Admitting: Urology

## 2022-08-08 VITALS — BP 157/91 | HR 60 | Ht 68.0 in | Wt 198.2 lb

## 2022-08-08 DIAGNOSIS — D494 Neoplasm of unspecified behavior of bladder: Secondary | ICD-10-CM

## 2022-08-08 LAB — URINALYSIS, ROUTINE W REFLEX MICROSCOPIC
Bilirubin, UA: NEGATIVE
Glucose, UA: NEGATIVE
Ketones, UA: NEGATIVE
Leukocytes,UA: NEGATIVE
Nitrite, UA: NEGATIVE
Protein,UA: NEGATIVE
RBC, UA: NEGATIVE
Specific Gravity, UA: 1.02 (ref 1.005–1.030)
Urobilinogen, Ur: 0.2 mg/dL (ref 0.2–1.0)
pH, UA: 5 (ref 5.0–7.5)

## 2022-08-08 MED ORDER — CIPROFLOXACIN HCL 500 MG PO TABS
500.0000 mg | ORAL_TABLET | Freq: Once | ORAL | Status: AC
  Administered 2022-08-08: 500 mg via ORAL

## 2022-08-08 NOTE — Progress Notes (Unsigned)
   08/08/22  CC: followup bladder cancer  HPI: [name] is a 62yo here for followup for bladder cancer Blood pressure (!) 157/91, pulse 60, height 5\' 8"  (1.727 m), weight 198 lb 3.2 oz (89.9 kg). NED. A&Ox3.   No respiratory distress   Abd soft, NT, ND Normal phallus with bilateral descended testicles  Cystoscopy Procedure Note  Patient identification was confirmed, informed consent was obtained, and patient was prepped using Betadine solution.  Lidocaine jelly was administered per urethral meatus.     Pre-Procedure: - Inspection reveals a normal caliber ureteral meatus.  Procedure: The flexible cystoscope was introduced without difficulty - No urethral strictures/lesions are present. - Enlarged prostate  - Normal bladder neck - Bilateral ureteral orifices identified - 60mm dome papillary tumor - No bladder stones - No trabeculation    Post-Procedure: - Patient tolerated the procedure well  Assessment/ Plan: We discussed the management of bladder cancer including surveillance versus transurethral resection and the patient will call with his decision.   No follow-ups on file.  [name], MD

## 2022-08-09 ENCOUNTER — Encounter: Payer: Self-pay | Admitting: Urology

## 2022-08-09 NOTE — Patient Instructions (Signed)

## 2022-08-31 ENCOUNTER — Telehealth: Payer: Self-pay

## 2022-08-31 NOTE — Telephone Encounter (Signed)
Pt wife has stated ot is needing a refill on his   HYDROcodone-acetaminophen (NORCO) 7.5-325 MG tablet  LOV 04/06/2022.

## 2022-09-02 MED ORDER — HYDROCODONE-ACETAMINOPHEN 7.5-325 MG PO TABS: 0.5000 | ORAL_TABLET | ORAL | 0 refills | Status: DC | PRN

## 2022-09-02 NOTE — Addendum Note (Signed)
Addended by: Tresa Garter on: 09/02/2022 11:18 AM   Modules accepted: Orders

## 2022-09-02 NOTE — Telephone Encounter (Signed)
I will renewed.  Needs office visit every 3 months.  Thanks

## 2022-09-18 ENCOUNTER — Other Ambulatory Visit: Payer: Self-pay | Admitting: Internal Medicine

## 2022-10-21 ENCOUNTER — Other Ambulatory Visit: Payer: Self-pay | Admitting: Internal Medicine

## 2022-11-10 ENCOUNTER — Other Ambulatory Visit: Payer: Self-pay | Admitting: Internal Medicine

## 2022-11-11 ENCOUNTER — Other Ambulatory Visit (INDEPENDENT_AMBULATORY_CARE_PROVIDER_SITE_OTHER): Payer: 59

## 2022-11-11 DIAGNOSIS — I2510 Atherosclerotic heart disease of native coronary artery without angina pectoris: Secondary | ICD-10-CM

## 2022-11-11 DIAGNOSIS — I2583 Coronary atherosclerosis due to lipid rich plaque: Secondary | ICD-10-CM | POA: Diagnosis not present

## 2022-11-11 DIAGNOSIS — E785 Hyperlipidemia, unspecified: Secondary | ICD-10-CM

## 2022-11-11 LAB — COMPREHENSIVE METABOLIC PANEL
ALT: 11 U/L (ref 0–53)
AST: 16 U/L (ref 0–37)
Albumin: 4.5 g/dL (ref 3.5–5.2)
Alkaline Phosphatase: 50 U/L (ref 39–117)
BUN: 20 mg/dL (ref 6–23)
CO2: 29 mEq/L (ref 19–32)
Calcium: 9.9 mg/dL (ref 8.4–10.5)
Chloride: 104 mEq/L (ref 96–112)
Creatinine, Ser: 1.02 mg/dL (ref 0.40–1.50)
GFR: 80.16 mL/min (ref >60.00)
Glucose, Bld: 85 mg/dL (ref 70–99)
Potassium: 4 mEq/L (ref 3.5–5.1)
Sodium: 140 mEq/L (ref 135–145)
Total Bilirubin: 0.8 mg/dL (ref 0.2–1.2)
Total Protein: 6.8 g/dL (ref 6.0–8.3)

## 2022-11-11 LAB — LIPID PANEL
Cholesterol: 166 mg/dL (ref 0–200)
HDL: 44 mg/dL (ref >39.00)
LDL Cholesterol: 105 mg/dL — ABNORMAL HIGH (ref 0–99)
NonHDL: 122.32
Total CHOL/HDL Ratio: 4
Triglycerides: 86 mg/dL (ref 0.0–149.0)
VLDL: 17.2 mg/dL (ref 0.0–40.0)

## 2022-11-11 LAB — TESTOSTERONE: Testosterone: 104.87 ng/dL — ABNORMAL LOW (ref 300.00–890.00)

## 2022-11-11 LAB — PSA: PSA: 0.77 ng/mL (ref 0.10–4.00)

## 2022-11-11 LAB — HEMOGLOBIN A1C: Hgb A1c MFr Bld: 5 % (ref 4.6–6.5)

## 2022-11-14 ENCOUNTER — Ambulatory Visit (INDEPENDENT_AMBULATORY_CARE_PROVIDER_SITE_OTHER): Payer: 59 | Admitting: Internal Medicine

## 2022-11-14 ENCOUNTER — Encounter: Payer: Self-pay | Admitting: Internal Medicine

## 2022-11-14 VITALS — BP 122/82 | HR 63 | Temp 98.2°F | Ht 68.0 in | Wt 207.0 lb

## 2022-11-14 DIAGNOSIS — I10 Essential (primary) hypertension: Secondary | ICD-10-CM | POA: Diagnosis not present

## 2022-11-14 DIAGNOSIS — E7849 Other hyperlipidemia: Secondary | ICD-10-CM

## 2022-11-14 DIAGNOSIS — E291 Testicular hypofunction: Secondary | ICD-10-CM

## 2022-11-14 DIAGNOSIS — E785 Hyperlipidemia, unspecified: Secondary | ICD-10-CM

## 2022-11-14 DIAGNOSIS — C670 Malignant neoplasm of trigone of bladder: Secondary | ICD-10-CM | POA: Diagnosis not present

## 2022-11-14 DIAGNOSIS — E6609 Other obesity due to excess calories: Secondary | ICD-10-CM

## 2022-11-14 DIAGNOSIS — Z6830 Body mass index (BMI) 30.0-30.9, adult: Secondary | ICD-10-CM

## 2022-11-14 MED ORDER — TIRZEPATIDE 10 MG/0.5ML ~~LOC~~ SOAJ: 10.0000 mg | SUBCUTANEOUS | 3 refills | Status: DC

## 2022-11-14 MED ORDER — COLCHICINE 0.6 MG PO CAPS: ORAL_CAPSULE | ORAL | 1 refills | Status: DC

## 2022-11-14 MED ORDER — INDOMETHACIN 50 MG PO CAPS: ORAL_CAPSULE | ORAL | 1 refills | Status: DC

## 2022-11-14 MED ORDER — CLONAZEPAM 1 MG PO TABS: ORAL_TABLET | ORAL | 1 refills | Status: DC

## 2022-11-14 MED ORDER — FENOFIBRATE 145 MG PO TABS: 145.0000 mg | ORAL_TABLET | Freq: Every day | ORAL | 3 refills | Status: DC

## 2022-11-14 MED ORDER — ATORVASTATIN CALCIUM 20 MG PO TABS: 20.0000 mg | ORAL_TABLET | Freq: Every day | ORAL | 3 refills | Status: DC

## 2022-11-14 MED ORDER — HYDROCODONE-ACETAMINOPHEN 7.5-325 MG PO TABS: 0.5000 | ORAL_TABLET | ORAL | 0 refills | Status: DC | PRN

## 2022-11-14 NOTE — Assessment & Plan Note (Signed)
Much better 

## 2022-11-14 NOTE — Assessment & Plan Note (Signed)
Re-start Testosterone  Potential benefits of a long term sex steroid  use as well as potential risks  and complications were explained to the patient and were aknowledged - OSA, MI etc.

## 2022-11-14 NOTE — Progress Notes (Signed)
Subjective:  Patient ID: Christian [name], male    DOB: 17-Dec-1960  Age: 62 y.o. MRN: [id number]  CC: Follow-up (6 mnth f/u)   HPI Christian [name] presents for hypogonadism, HTN, DM, dyslipidemia  Outpatient Medications Prior to Visit  Medication Sig Dispense Refill   acetaminophen (TYLENOL) 500 MG tablet Take 1,000 mg by mouth every 8 (eight) hours as needed for moderate pain.     aspirin EC 81 MG tablet Take 81 mg by mouth daily. Swallow whole.     testosterone cypionate (DEPOTESTOSTERONE CYPIONATE) 200 MG/ML injection INJECT 1 ML IN THE MUSCLE EVERY 14 DAYS 10 mL 0   umeclidinium-vilanterol (ANORO ELLIPTA) 62.5-25 MCG/INH AEPB Inhale 1 puff into the lungs daily. 60 each 2   atorvastatin (LIPITOR) 20 MG tablet Take 1 tablet (20 mg total) by mouth daily. 90 tablet 3   clonazePAM (KLONOPIN) 1 MG tablet TAKE 1 TABLET BY MOUTH TWICE A DAY AS NEEDED FOR ANXIETY 60 tablet 1   Colchicine 0.6 MG CAPS TAKE 1 CAPSULE TWICE DAILY FOR GOUT ATTACK 90 capsule 1   fenofibrate (TRICOR) 145 MG tablet Take 1 tablet (145 mg total) by mouth daily. 90 tablet 3   HYDROcodone-acetaminophen (NORCO) 7.5-325 MG tablet Take 0.5-1 tablets by mouth every 4 (four) hours as needed for moderate pain or severe pain. 30 tablet 0   indomethacin (INDOCIN) 50 MG capsule TAKE 1 CAPSULE BY MOUTH THREE TIMES DAILY AS NEEDED FOR MODERATE PAIN 30 capsule 1   tirzepatide (MOUNJARO) 10 MG/0.5ML Pen Inject 10 mg into the skin once a week. 6 mL 3   No facility-administered medications prior to visit.    ROS: Review of Systems  Constitutional:  Negative for appetite change, fatigue and unexpected weight change.  HENT:  Negative for congestion, nosebleeds, sneezing, sore throat and trouble swallowing.   Eyes:  Negative for itching and visual disturbance.  Respiratory:  Negative for cough.   Cardiovascular:  Negative for chest pain, palpitations and leg swelling.  Gastrointestinal:  Negative for abdominal distention, blood in  stool, diarrhea and nausea.  Genitourinary:  Negative for frequency and hematuria.  Musculoskeletal:  Positive for arthralgias. Negative for back pain, gait problem, joint swelling and neck pain.  Skin:  Negative for rash.  Neurological:  Negative for dizziness, tremors, speech difficulty and weakness.  Psychiatric/Behavioral:  Negative for agitation, dysphoric mood, sleep disturbance and suicidal ideas. The patient is not nervous/anxious.     Objective:  BP 122/82 (BP Location: Left Arm, Patient Position: Sitting, Cuff Size: Large)   Pulse 63   Temp 98.2 F (36.8 C) (Oral)   Ht 5\' 8"  (1.727 m)   Wt 207 lb (93.9 kg)   SpO2 98%   BMI 31.47 kg/m   BP Readings from Last 3 Encounters:  11/14/22 122/82  08/08/22 (!) 157/91  04/06/22 120/84    Wt Readings from Last 3 Encounters:  11/14/22 207 lb (93.9 kg)  08/08/22 198 lb 3.2 oz (89.9 kg)  04/06/22 198 lb 3.2 oz (89.9 kg)    Physical Exam Constitutional:      General: He is not in acute distress.    Appearance: Normal appearance. He is well-developed.     Comments: NAD  Eyes:     Conjunctiva/sclera: Conjunctivae normal.     Pupils: Pupils are equal, round, and reactive to light.  Neck:     Thyroid: No thyromegaly.     Vascular: No JVD.  Cardiovascular:     Rate and Rhythm: Normal rate  and regular rhythm.     Heart sounds: Normal heart sounds. No murmur heard.    No friction rub. No gallop.  Pulmonary:     Effort: Pulmonary effort is normal. No respiratory distress.     Breath sounds: Normal breath sounds. No wheezing or rales.  Chest:     Chest wall: No tenderness.  Abdominal:     General: Bowel sounds are normal. There is no distension.     Palpations: Abdomen is soft. There is no mass.     Tenderness: There is no abdominal tenderness. There is no guarding or rebound.  Musculoskeletal:        General: No tenderness. Normal range of motion.     Cervical back: Normal range of motion.  Lymphadenopathy:     Cervical:  No cervical adenopathy.  Skin:    General: Skin is warm and dry.     Findings: No rash.  Neurological:     Mental Status: He is alert and oriented to person, place, and time.     Cranial Nerves: No cranial nerve deficit.     Motor: No abnormal muscle tone.     Coordination: Coordination normal.     Gait: Gait normal.     Deep Tendon Reflexes: Reflexes are normal and symmetric.  Psychiatric:        Behavior: Behavior normal.        Thought Content: Thought content normal.        Judgment: Judgment normal.     Lab Results  Component Value Date   WBC 6.6 10/01/2021   HGB 16.3 10/01/2021   HCT 48.4 10/01/2021   PLT 162.0 10/01/2021   GLUCOSE 85 11/11/2022   CHOL 166 11/11/2022   TRIG 86.0 11/11/2022   HDL 44.00 11/11/2022   LDLDIRECT 191.0 03/22/2021   LDLCALC 105 (H) 11/11/2022   ALT 11 11/11/2022   AST 16 11/11/2022   NA 140 11/11/2022   K 4.0 11/11/2022   CL 104 11/11/2022   CREATININE 1.02 11/11/2022   BUN 20 11/11/2022   CO2 29 11/11/2022   TSH 1.40 04/05/2022   PSA 0.77 11/11/2022   INR 1.01 03/28/2012   HGBA1C 5.0 11/11/2022    No results found.  Assessment & Plan:   Problem List Items Addressed This Visit     Bladder cancer (HCC)    F/u w/Dr Ronne Binning       Relevant Medications   Colchicine 0.6 MG CAPS   HTN (hypertension) - Primary    Doing well w/wt loss      Relevant Medications   atorvastatin (LIPITOR) 20 MG tablet   fenofibrate (TRICOR) 145 MG tablet   Dyslipidemia    Cont on Lipitor      Relevant Medications   atorvastatin (LIPITOR) 20 MG tablet   fenofibrate (TRICOR) 145 MG tablet   Obesity    Much better      Relevant Medications   tirzepatide (MOUNJARO) 10 MG/0.5ML Pen   Hypogonadism male    Re-start Testosterone  Potential benefits of a long term sex steroid  use as well as potential risks  and complications were explained to the patient and were aknowledged - OSA, MI etc.      Other Visit Diagnoses     Other  hyperlipidemia       Relevant Medications   atorvastatin (LIPITOR) 20 MG tablet   fenofibrate (TRICOR) 145 MG tablet         Meds ordered this encounter  Medications  atorvastatin (LIPITOR) 20 MG tablet    Sig: Take 1 tablet (20 mg total) by mouth daily.    Dispense:  90 tablet    Refill:  3   clonazePAM (KLONOPIN) 1 MG tablet    Sig: TAKE 1 TABLET BY MOUTH TWICE A DAY AS NEEDED FOR ANXIETY    Dispense:  60 tablet    Refill:  1    This request is for a new prescription for a controlled substance as required by Federal/State law..   Colchicine 0.6 MG CAPS    Sig: TAKE 1 CAPSULE TWICE DAILY FOR GOUT ATTACK    Dispense:  90 capsule    Refill:  1   fenofibrate (TRICOR) 145 MG tablet    Sig: Take 1 tablet (145 mg total) by mouth daily.    Dispense:  90 tablet    Refill:  3   HYDROcodone-acetaminophen (NORCO) 7.5-325 MG tablet    Sig: Take 0.5-1 tablets by mouth every 4 (four) hours as needed for moderate pain or severe pain.    Dispense:  30 tablet    Refill:  0    Office visit every 3 months   indomethacin (INDOCIN) 50 MG capsule    Sig: TAKE 1 CAPSULE BY MOUTH THREE TIMES DAILY AS NEEDED FOR MODERATE PAIN    Dispense:  30 capsule    Refill:  1   tirzepatide (MOUNJARO) 10 MG/0.5ML Pen    Sig: Inject 10 mg into the skin once a week.    Dispense:  6 mL    Refill:  3      Follow-up: No follow-ups on file.  Sonda Primes, MD

## 2022-11-14 NOTE — Assessment & Plan Note (Signed)
F/u w/Dr McKenzie  

## 2022-11-14 NOTE — Assessment & Plan Note (Signed)
Cont on Lipitor 

## 2022-11-14 NOTE — Assessment & Plan Note (Signed)
Doing well w/wt loss 

## 2023-01-10 ENCOUNTER — Telehealth: Payer: Self-pay | Admitting: Urology

## 2023-01-10 NOTE — Telephone Encounter (Signed)
Will patient need a follow up?  If not please add to surgery workque.

## 2023-01-10 NOTE — Telephone Encounter (Signed)
Patient wife is calling to get her husband surgery scheduled, he is ready to have it done.

## 2023-01-17 ENCOUNTER — Other Ambulatory Visit: Payer: Self-pay | Admitting: Urology

## 2023-01-17 DIAGNOSIS — D494 Neoplasm of unspecified behavior of bladder: Secondary | ICD-10-CM

## 2023-01-17 NOTE — Telephone Encounter (Signed)
Patient needs this surgery schedule before her insurance expires .  She needs you to have you call her cell phone, she needs a call today before 5pm . She is very upset that she did not get a response on Friday.

## 2023-01-31 NOTE — Patient Instructions (Signed)
Christian [name]  01/31/2023     @PREFPERIOPPHARMACY @   Your procedure is scheduled on 02/02/2023.  Report to [facility] at 11:30 A.M.  Call this number if you have problems the morning of surgery:  [phone]  If you experience any cold or flu symptoms such as cough, fever, chills, shortness of breath, etc. between now and your scheduled surgery, please notify us at the above number.   Remember:   Do not eat or drink anything after midnight.      Take these medicines the morning of surgery with A SIP OF WATER : Clonazepam and Norco    Do not wear jewelry, make-up or nail polish, including gel polish,  artificial nails, or any other type of covering on natural nails (fingers and  toes).  Do not wear lotions, powders, or perfumes, or deodorant.  Do not shave 48 hours prior to surgery.  Men may shave face and neck.  Do not bring valuables to the hospital.  [facility] is not responsible for any belongings or valuables.  Contacts, dentures or bridgework may not be worn into surgery.  Leave your suitcase in the car.  After surgery it may be brought to your room.  For patients admitted to the hospital, discharge time will be determined by your treatment team.  Patients discharged the day of surgery will not be allowed to drive home.   Name and phone number of your driver:   Family Special instructions:  N/A  Please read over the following fact sheets that you were given. Care and Recovery After Surgery  Transurethral Resection of Bladder Tumor  Transurethral resection of a bladder tumor is the removal (resection) of cancerous tissue (tumor) from the inside wall of the bladder. The bladder is the organ that holds urine. The tumor is removed through the tube that carries urine out of the body (urethra). In a transurethral resection, a thin telescope with a light, a tiny camera, and an electric cutting edge (resectoscope) is passed through the urethra. In men, the opening of the  urethra is at the end of the penis. In women, it is just above the opening of the vagina. Tell a health care provider about: Any allergies you have. All medicines you are taking, including vitamins, herbs, eye drops, creams, and over-the-counter medicines. Any problems you or family members have had with anesthetic medicines. Any bleeding problems you have. Any surgeries you have had. Any medical conditions you have, including recent urinary tract infections. Whether you are pregnant or may be pregnant. What are the risks? Generally, this is a safe procedure. However, problems may occur, including: Infection. Bleeding. Allergic reactions to medicines. Damage to nearby structures or organs. Difficulty urinating from blockage of the urethra or not being able to urinate (urinary retention). Deep vein thrombosis. This is a blood clot that can develop in your leg. Recurring cancer. What happens before the procedure? When to stop eating and drinking Follow instructions from your health care provider about what you may eat and drink before your procedure. These may include: 8 hours before your procedure Stop eating most foods. Do not eat meat, fried foods, or fatty foods. Eat only light foods, such as toast or crackers. All liquids are okay except energy drinks and alcohol. 6 hours before your procedure Stop eating. Drink only clear liquids, such as water, clear fruit juice, black coffee, plain tea, and sports drinks. Do not drink energy drinks or alcohol. 2 hours before your procedure Stop drinking  all liquids. You may be allowed to take medicines with small sips of water. Medicines Ask your health care provider about: Changing or stopping your regular medicines. This is especially important if you are taking diabetes medicines or blood thinners. Taking medicines such as aspirin and ibuprofen. These medicines can thin your blood. Do not take these medicines unless your health care  provider tells you to take them. Taking over-the-counter medicines, vitamins, herbs, and supplements. General instructions If you will be going home right after the procedure, plan to have a responsible adult: Take you home from the hospital or clinic. You will not be allowed to drive. Care for you for the time you are told. Ask your health care provider what steps will be taken to help prevent infection. These steps may include: Washing skin with a germ-killing soap. Taking antibiotic medicine. Do not use any products that contain nicotine or tobacco for at least 4 weeks before the procedure. These products include cigarettes, chewing tobacco, and vaping devices, such as e-cigarettes. If you need help quitting, ask your health care provider. What happens during the procedure? An IV will be inserted into one of your veins. You will be given one or more of the following: A medicine to help you relax (sedative). A medicine that is injected into your spine to numb the area below and slightly above the injection site (spinal anesthetic). A medicine that is injected into an area of your body to numb everything below the injection site (regional anesthetic). A medicine to make you fall asleep (general anesthetic). Your legs will be placed in foot rests (stirrups) to open your legs and bend your knees. The resectoscope will be passed through your urethra and into your bladder. The part of your bladder with the tumor will be resected by the cutting edge of the resectoscope. Fluid will be passed to rinse out the cut tissues (irrigation). The resectoscope will then be taken out. A small, thin tube (catheter) will be passed through your urethra and into your bladder. The catheter will drain urine into a bag outside of your body. The procedure may vary among health care providers and hospitals. What happens after the procedure? Your blood pressure, heart rate, breathing rate, and blood oxygen level will  be monitored until you leave the hospital or clinic. You may continue to receive fluids and medicines through an IV. You will be given pain medicine to relieve pain. You will have a catheter to drain your urine. The amount of urine will be measured. If you have blood in your urine, your bladder may be rinsed out by passing fluid through your catheter. You will be encouraged to walk as soon as you can. You may have to wear compression stockings. These stockings help to prevent blood clots and reduce swelling in your legs. If you were given a sedative during the procedure, it can affect you for several hours. Do not drive or operate machinery until your health care provider says that it is safe. Summary Transurethral resection of a bladder tumor is the removal (resection) of a cancerous growth (tumor) on the inside wall of the bladder. To do this procedure, your health care provider uses a thin telescope with a light, a tiny camera, and an electric cutting edge (resectoscope) that is guided to your bladder through your urethra. The part of your bladder that is affected by the tumor will be resected by the cutting edge of the resectoscope. A catheter will be passed through your urethra and  into your bladder. The catheter will drain urine into a bag outside of your body. If you will be going home right after the procedure, plan to have a responsible adult take you home from the hospital or clinic. You will not be allowed to drive. This information is not intended to replace advice given to you by your health care provider. Make sure you discuss any questions you have with your health care provider. Document Revised: 05/07/2021 Document Reviewed: 05/07/2021 Elsevier Patient Education  2024 [employer]  General Anesthesia, Adult General anesthesia is the use of medicine to make you fall asleep (unconscious) for a medical procedure. General anesthesia must be used for certain procedures. It is often  recommended for surgery or procedures that: Last a long time. Require you to be still or in an unusual position. Are major and can cause blood loss. Affect your breathing. The medicines used for general anesthesia are called general anesthetics. During general anesthesia, these medicines are given along with medicines that: Prevent pain. Control your blood pressure. Relax your muscles. Prevent nausea and vomiting after the procedure. Tell a health care provider about: Any allergies you have. All medicines you are taking, including vitamins, herbs, eye drops, creams, and over-the-counter medicines. Your history of any: Medical conditions you have, including: High blood pressure. Bleeding problems. Diabetes. Heart or lung conditions, such as: Heart failure. Sleep apnea. Asthma. Chronic obstructive pulmonary disease (COPD). Current or recent illnesses, such as: Upper respiratory, chest, or ear infections. Cough or fever. Tobacco or drug use, including marijuana or alcohol use. Depression or anxiety. Surgeries and types of anesthetics you have had. Problems you or family members have had with anesthetic medicines. Whether you are pregnant or may be pregnant. Whether you have any chipped or loose teeth, dentures, caps, bridgework, or issues with your mouth, swallowing, or choking. What are the risks? Your health care provider will talk with you about risks. These may include: Allergic reaction to the medicines. Lung and heart problems. Inhaling food or liquid from the stomach into the lungs (aspiration). Nerve injury. Injury to the lips, mouth, teeth, or gums. Stroke. Waking up during your procedure and being unable to move. This is rare. These problems are more likely to develop if you are having a major surgery or if you have an advanced or serious medical condition. You can prevent some of these complications by answering all of your health care provider's questions thoroughly  and by following all instructions before your procedure. General anesthesia can cause side effects, including: Nausea or vomiting. A sore throat or hoarseness from the breathing tube. Wheezing or coughing. Shaking chills or feeling cold. Body aches. Sleepiness. Confusion, agitation (delirium), or anxiety. What happens before the procedure? When to stop eating and drinking Follow instructions from your health care provider about what you may eat and drink before your procedure. If you do not follow your health care provider's instructions, your procedure may be delayed or canceled. Medicines Ask your health care provider about: Changing or stopping your regular medicines. These include any diabetes medicines or blood thinners you take. Taking medicines such as aspirin and ibuprofen. These medicines can thin your blood. Do not take them unless your health care provider tells you to. Taking over-the-counter medicines, vitamins, herbs, and supplements. General instructions Do not use any products that contain nicotine or tobacco for at least 4 weeks before the procedure. These products include cigarettes, chewing tobacco, and vaping devices, such as e-cigarettes. If you need help quitting, ask your  health care provider. If you brush your teeth on the morning of the procedure, make sure to spit out all of the water and toothpaste. If told by your health care provider, bring your sleep apnea device with you to surgery (if applicable). If you will be going home right after the procedure, plan to have a responsible adult: Take you home from the hospital or clinic. You will not be allowed to drive. Care for you for the time you are told. What happens during the procedure?  An IV will be inserted into one of your veins. You will be given one or more of the following through a face mask or IV: A sedative. This helps you relax. Anesthesia. This will: Numb certain areas of your body. Make you fall  asleep for surgery. After you are unconscious, a breathing tube may be inserted down your throat to help you breathe. This will be removed before you wake up. An anesthesia provider, such as an anesthesiologist, will stay with you throughout your procedure. The anesthesia provider will: Keep you comfortable and safe by continuing to give you medicines and adjusting the amount of medicine that you get. Monitor your blood pressure, heart rate, and oxygen levels to make sure that the anesthetics do not cause any problems. The procedure may vary among health care providers and hospitals. What happens after the procedure? Your blood pressure, temperature, heart rate, breathing rate, and blood oxygen level will be monitored until you leave the hospital or clinic. You will wake up in a recovery area. You may wake up slowly. You may be given medicine to help you with pain, nausea, or any other side effects from the anesthesia. Summary General anesthesia is the use of medicine to make you fall asleep (unconscious) for a medical procedure. Follow your health care provider's instructions about when to stop eating, drinking, or taking certain medicines before your procedure. Plan to have a responsible adult take you home from the hospital or clinic. This information is not intended to replace advice given to you by your health care provider. Make sure you discuss any questions you have with your health care provider. Document Revised: 07/29/2021 Document Reviewed: 07/29/2021 Elsevier Patient Education  2024 [employer]  How to Use Chlorhexidine Before Surgery Chlorhexidine gluconate (CHG) is a germ-killing (antiseptic) solution that is used to clean the skin. It can get rid of the bacteria that normally live on the skin and can keep them away for about 24 hours. To clean your skin with CHG, you may be given: A CHG solution to use in the shower or as part of a sponge bath. A prepackaged cloth that  contains CHG. Cleaning your skin with CHG may help lower the risk for infection: While you are staying in the intensive care unit of the hospital. If you have a vascular access, such as a central line, to provide short-term or long-term access to your veins. If you have a catheter to drain urine from your bladder. If you are on a ventilator. A ventilator is a machine that helps you breathe by moving air in and out of your lungs. After surgery. What are the risks? Risks of using CHG include: A skin reaction. Hearing loss, if CHG gets in your ears and you have a perforated eardrum. Eye injury, if CHG gets in your eyes and is not rinsed out. The CHG product catching fire. Make sure that you avoid smoking and flames after applying CHG to your skin. Do not use  CHG: If you have a chlorhexidine allergy or have previously reacted to chlorhexidine. On babies younger than 23 months of age. How to use CHG solution Use CHG only as told by your health care provider, and follow the instructions on the label. Use the full amount of CHG as directed. Usually, this is one bottle. During a shower Follow these steps when using CHG solution during a shower (unless your health care provider gives you different instructions): Start the shower. Use your normal soap and shampoo to wash your face and hair. Turn off the shower or move out of the shower stream. Pour the CHG onto a clean washcloth. Do not use any type of brush or rough-edged sponge. Starting at your neck, lather your body down to your toes. Make sure you follow these instructions: If you will be having surgery, pay special attention to the part of your body where you will be having surgery. Scrub this area for at least 1 minute. Do not use CHG on your head or face. If the solution gets into your ears or eyes, rinse them well with water. Avoid your genital area. Avoid any areas of skin that have broken skin, cuts, or scrapes. Scrub your back and  under your arms. Make sure to wash skin folds. Let the lather sit on your skin for 1-2 minutes or as long as told by your health care provider. Thoroughly rinse your entire body in the shower. Make sure that all body creases and crevices are rinsed well. Dry off with a clean towel. Do not put any substances on your body afterward--such as powder, lotion, or perfume--unless you are told to do so by your health care provider. Only use lotions that are recommended by the manufacturer. Put on clean clothes or pajamas. If it is the night before your surgery, sleep in clean sheets.  During a sponge bath Follow these steps when using CHG solution during a sponge bath (unless your health care provider gives you different instructions): Use your normal soap and shampoo to wash your face and hair. Pour the CHG onto a clean washcloth. Starting at your neck, lather your body down to your toes. Make sure you follow these instructions: If you will be having surgery, pay special attention to the part of your body where you will be having surgery. Scrub this area for at least 1 minute. Do not use CHG on your head or face. If the solution gets into your ears or eyes, rinse them well with water. Avoid your genital area. Avoid any areas of skin that have broken skin, cuts, or scrapes. Scrub your back and under your arms. Make sure to wash skin folds. Let the lather sit on your skin for 1-2 minutes or as long as told by your health care provider. Using a different clean, wet washcloth, thoroughly rinse your entire body. Make sure that all body creases and crevices are rinsed well. Dry off with a clean towel. Do not put any substances on your body afterward--such as powder, lotion, or perfume--unless you are told to do so by your health care provider. Only use lotions that are recommended by the manufacturer. Put on clean clothes or pajamas. If it is the night before your surgery, sleep in clean sheets. How to use  CHG prepackaged cloths Only use CHG cloths as told by your health care provider, and follow the instructions on the label. Use the CHG cloth on clean, dry skin. Do not use the CHG cloth on your  head or face unless your health care provider tells you to. When washing with the CHG cloth: Avoid your genital area. Avoid any areas of skin that have broken skin, cuts, or scrapes. Before surgery Follow these steps when using a CHG cloth to clean before surgery (unless your health care provider gives you different instructions): Using the CHG cloth, vigorously scrub the part of your body where you will be having surgery. Scrub using a back-and-forth motion for 3 minutes. The area on your body should be completely wet with CHG when you are done scrubbing. Do not rinse. Discard the cloth and let the area air-dry. Do not put any substances on the area afterward, such as powder, lotion, or perfume. Put on clean clothes or pajamas. If it is the night before your surgery, sleep in clean sheets.  For general bathing Follow these steps when using CHG cloths for general bathing (unless your health care provider gives you different instructions). Use a separate CHG cloth for each area of your body. Make sure you wash between any folds of skin and between your fingers and toes. Wash your body in the following order, switching to a new cloth after each step: The front of your neck, shoulders, and chest. Both of your arms, under your arms, and your hands. Your stomach and groin area, avoiding the genitals. Your right leg and foot. Your left leg and foot. The back of your neck, your back, and your buttocks. Do not rinse. Discard the cloth and let the area air-dry. Do not put any substances on your body afterward--such as powder, lotion, or perfume--unless you are told to do so by your health care provider. Only use lotions that are recommended by the manufacturer. Put on clean clothes or pajamas. Contact a health  care provider if: Your skin gets irritated after scrubbing. You have questions about using your solution or cloth. You swallow any chlorhexidine. Call your local poison control center (7406935958 in the [place] Get help right away if: Your eyes itch badly, or they become very red or swollen. Your skin itches badly and is red or swollen. Your hearing changes. You have trouble seeing. You have swelling or tingling in your mouth or throat. You have trouble breathing. These symptoms may represent a serious problem that is an emergency. Do not wait to see if the symptoms will go away. Get medical help right away. Call your local emergency services (911 in the [place] Do not drive yourself to the hospital. Summary Chlorhexidine gluconate (CHG) is a germ-killing (antiseptic) solution that is used to clean the skin. Cleaning your skin with CHG may help to lower your risk for infection. You may be given CHG to use for bathing. It may be in a bottle or in a prepackaged cloth to use on your skin. Carefully follow your health care provider's instructions and the instructions on the product label. Do not use CHG if you have a chlorhexidine allergy. Contact your health care provider if your skin gets irritated after scrubbing. This information is not intended to replace advice given to you by your health care provider. Make sure you discuss any questions you have with your health care provider. Document Revised: 08/30/2021 Document Reviewed: 07/13/2020 Elsevier Patient Education  2023 [employer].

## 2023-02-01 ENCOUNTER — Encounter (HOSPITAL_COMMUNITY): Payer: Self-pay

## 2023-02-01 ENCOUNTER — Encounter (HOSPITAL_COMMUNITY)
Admission: RE | Admit: 2023-02-01 | Discharge: 2023-02-01 | Disposition: A | Discharge status: Home / Self Care | Payer: 59 | Source: Ambulatory Visit | Attending: Urology | Admitting: Urology

## 2023-02-01 ENCOUNTER — Other Ambulatory Visit: Payer: Self-pay

## 2023-02-01 VITALS — BP 167/89 | HR 68 | Temp 97.8°F | Resp 18 | Ht 68.0 in | Wt 207.0 lb

## 2023-02-01 DIAGNOSIS — I453 Trifascicular block: Secondary | ICD-10-CM | POA: Diagnosis not present

## 2023-02-01 DIAGNOSIS — Z01818 Encounter for other preprocedural examination: Secondary | ICD-10-CM | POA: Diagnosis present

## 2023-02-01 DIAGNOSIS — R9431 Abnormal electrocardiogram [ECG] [EKG]: Secondary | ICD-10-CM | POA: Insufficient documentation

## 2023-02-01 DIAGNOSIS — Z01812 Encounter for preprocedural laboratory examination: Secondary | ICD-10-CM | POA: Diagnosis not present

## 2023-02-01 DIAGNOSIS — C670 Malignant neoplasm of trigone of bladder: Secondary | ICD-10-CM | POA: Insufficient documentation

## 2023-02-01 DIAGNOSIS — Z01810 Encounter for preprocedural cardiovascular examination: Secondary | ICD-10-CM | POA: Insufficient documentation

## 2023-02-01 HISTORY — DX: Prediabetes: R73.03

## 2023-02-01 LAB — BASIC METABOLIC PANEL WITH GFR
Anion gap: 10 (ref 5–15)
BUN: 16 mg/dL (ref 6–20)
CO2: 23 mmol/L (ref 22–32)
Calcium: 9.3 mg/dL (ref 8.9–10.3)
Chloride: 105 mmol/L (ref 98–111)
Creatinine, Ser: 0.98 mg/dL (ref 0.61–1.24)
GFR, Estimated: >60 mL/min (ref >60)
Glucose, Bld: 99 mg/dL (ref 70–99)
Potassium: 3.7 mmol/L (ref 3.5–5.1)
Sodium: 138 mmol/L (ref 135–145)

## 2023-02-02 ENCOUNTER — Ambulatory Visit (HOSPITAL_BASED_OUTPATIENT_CLINIC_OR_DEPARTMENT_OTHER): Payer: 59 | Admitting: Certified Registered"

## 2023-02-02 ENCOUNTER — Ambulatory Visit (HOSPITAL_COMMUNITY): Payer: 59 | Admitting: Certified Registered"

## 2023-02-02 ENCOUNTER — Ambulatory Visit (HOSPITAL_COMMUNITY)
Admission: RE | Admit: 2023-02-02 | Discharge: 2023-02-02 | Disposition: A | Discharge status: Home / Self Care | Payer: 59 | Attending: Urology | Admitting: Urology

## 2023-02-02 ENCOUNTER — Encounter (HOSPITAL_COMMUNITY)
Admission: RE | Disposition: A | Discharge status: Home / Self Care | Payer: Self-pay | Source: Home / Self Care | Attending: Urology

## 2023-02-02 DIAGNOSIS — R7303 Prediabetes: Secondary | ICD-10-CM | POA: Diagnosis not present

## 2023-02-02 DIAGNOSIS — K219 Gastro-esophageal reflux disease without esophagitis: Secondary | ICD-10-CM | POA: Insufficient documentation

## 2023-02-02 DIAGNOSIS — G4730 Sleep apnea, unspecified: Secondary | ICD-10-CM | POA: Insufficient documentation

## 2023-02-02 DIAGNOSIS — D494 Neoplasm of unspecified behavior of bladder: Secondary | ICD-10-CM

## 2023-02-02 DIAGNOSIS — Z87891 Personal history of nicotine dependence: Secondary | ICD-10-CM | POA: Insufficient documentation

## 2023-02-02 DIAGNOSIS — I452 Bifascicular block: Secondary | ICD-10-CM | POA: Insufficient documentation

## 2023-02-02 DIAGNOSIS — Z8551 Personal history of malignant neoplasm of bladder: Secondary | ICD-10-CM | POA: Diagnosis not present

## 2023-02-02 DIAGNOSIS — I10 Essential (primary) hypertension: Secondary | ICD-10-CM | POA: Diagnosis not present

## 2023-02-02 DIAGNOSIS — J45909 Unspecified asthma, uncomplicated: Secondary | ICD-10-CM | POA: Diagnosis not present

## 2023-02-02 HISTORY — PX: CYSTOSCOPY: SHX5120

## 2023-02-02 HISTORY — PX: TRANSURETHRAL RESECTION OF BLADDER TUMOR: SHX2575

## 2023-02-02 LAB — GLUCOSE, CAPILLARY
Glucose-Capillary: 91 mg/dL (ref 70–99)
Glucose-Capillary: 95 mg/dL (ref 70–99)

## 2023-02-02 SURGERY — CYSTOSCOPY
Anesthesia: General | Site: Bladder

## 2023-02-02 MED ORDER — FENTANYL CITRATE (PF) 100 MCG/2ML IJ SOLN
INTRAMUSCULAR | Status: AC
  Filled 2023-02-02: qty 2

## 2023-02-02 MED ORDER — EPHEDRINE 5 MG/ML INJ
INTRAVENOUS | Status: AC
  Filled 2023-02-02: qty 5

## 2023-02-02 MED ORDER — DEXAMETHASONE SODIUM PHOSPHATE 10 MG/ML IJ SOLN
INTRAMUSCULAR | Status: DC | PRN
  Administered 2023-02-02: 6 mg via INTRAVENOUS

## 2023-02-02 MED ORDER — PROPOFOL 10 MG/ML IV BOLUS
INTRAVENOUS | Status: AC
  Filled 2023-02-02: qty 20

## 2023-02-02 MED ORDER — LACTATED RINGERS IV SOLN: INTRAVENOUS | Status: DC

## 2023-02-02 MED ORDER — SUGAMMADEX SODIUM 200 MG/2ML IV SOLN
INTRAVENOUS | Status: DC | PRN
  Administered 2023-02-02: 200 mg via INTRAVENOUS

## 2023-02-02 MED ORDER — ROCURONIUM BROMIDE 10 MG/ML (PF) SYRINGE
PREFILLED_SYRINGE | INTRAVENOUS | Status: DC | PRN
  Administered 2023-02-02: 50 mg via INTRAVENOUS

## 2023-02-02 MED ORDER — CHLORHEXIDINE GLUCONATE 0.12 % MT SOLN: 15.0000 mL | Freq: Once | OROMUCOSAL | Status: AC

## 2023-02-02 MED ORDER — ROCURONIUM BROMIDE 10 MG/ML (PF) SYRINGE
PREFILLED_SYRINGE | INTRAVENOUS | Status: AC
  Filled 2023-02-02: qty 10

## 2023-02-02 MED ORDER — CEFAZOLIN SODIUM-DEXTROSE 2-4 GM/100ML-% IV SOLN
2.0000 g | INTRAVENOUS | Status: AC
  Administered 2023-02-02: 2 g via INTRAVENOUS
  Filled 2023-02-02: qty 100

## 2023-02-02 MED ORDER — ONDANSETRON HCL 4 MG/2ML IJ SOLN
INTRAMUSCULAR | Status: DC | PRN
  Administered 2023-02-02: 4 mg via INTRAVENOUS

## 2023-02-02 MED ORDER — CHLORHEXIDINE GLUCONATE 0.12 % MT SOLN
OROMUCOSAL | Status: AC
  Administered 2023-02-02: 15 mL via OROMUCOSAL
  Filled 2023-02-02: qty 15

## 2023-02-02 MED ORDER — EPHEDRINE SULFATE-NACL 50-0.9 MG/10ML-% IV SOSY
PREFILLED_SYRINGE | INTRAVENOUS | Status: DC | PRN
Start: 2023-02-02 — End: 2023-02-02
  Administered 2023-02-02 (×2): 10 mg via INTRAVENOUS

## 2023-02-02 MED ORDER — ORAL CARE MOUTH RINSE: 15.0000 mL | Freq: Once | OROMUCOSAL | Status: AC

## 2023-02-02 MED ORDER — SODIUM CHLORIDE 0.9 % IR SOLN
Status: DC | PRN
  Administered 2023-02-02: 3000 mL

## 2023-02-02 MED ORDER — FENTANYL CITRATE (PF) 100 MCG/2ML IJ SOLN
INTRAMUSCULAR | Status: DC | PRN
  Administered 2023-02-02: 50 ug via INTRAVENOUS
  Administered 2023-02-02: 100 ug via INTRAVENOUS

## 2023-02-02 MED ORDER — OXYCODONE HCL 5 MG PO TABS: 5.0000 mg | ORAL_TABLET | Freq: Once | ORAL | Status: DC | PRN

## 2023-02-02 MED ORDER — LIDOCAINE HCL (CARDIAC) PF 100 MG/5ML IV SOSY
PREFILLED_SYRINGE | INTRAVENOUS | Status: DC | PRN
  Administered 2023-02-02: 80 mg via INTRATRACHEAL

## 2023-02-02 MED ORDER — MIDAZOLAM HCL 2 MG/2ML IJ SOLN
INTRAMUSCULAR | Status: AC
  Filled 2023-02-02: qty 2

## 2023-02-02 MED ORDER — ONDANSETRON HCL 4 MG/2ML IJ SOLN
INTRAMUSCULAR | Status: AC
  Filled 2023-02-02: qty 2

## 2023-02-02 MED ORDER — LIDOCAINE HCL (PF) 2 % IJ SOLN
INTRAMUSCULAR | Status: AC
  Filled 2023-02-02: qty 5

## 2023-02-02 MED ORDER — ONDANSETRON HCL 4 MG/2ML IJ SOLN: 4.0000 mg | Freq: Once | INTRAMUSCULAR | Status: DC | PRN

## 2023-02-02 MED ORDER — PROPOFOL 10 MG/ML IV BOLUS
INTRAVENOUS | Status: DC | PRN
Start: 2023-02-02 — End: 2023-02-02
  Administered 2023-02-02: 230 mg via INTRAVENOUS

## 2023-02-02 MED ORDER — HYDROMORPHONE HCL 1 MG/ML IJ SOLN
INTRAMUSCULAR | Status: AC
  Filled 2023-02-02: qty 1

## 2023-02-02 MED ORDER — FENTANYL CITRATE PF 50 MCG/ML IJ SOSY: 25.0000 ug | PREFILLED_SYRINGE | INTRAMUSCULAR | Status: DC | PRN

## 2023-02-02 MED ORDER — MIDAZOLAM HCL 2 MG/2ML IJ SOLN
INTRAMUSCULAR | Status: DC | PRN
  Administered 2023-02-02: 2 mg via INTRAVENOUS

## 2023-02-02 MED ORDER — OXYCODONE HCL 5 MG/5ML PO SOLN: 5.0000 mg | Freq: Once | ORAL | Status: DC | PRN

## 2023-02-02 MED ORDER — STERILE WATER FOR IRRIGATION IR SOLN
Status: DC | PRN
  Administered 2023-02-02: 500 mL

## 2023-02-02 MED ORDER — HYDROCODONE-ACETAMINOPHEN 7.5-325 MG PO TABS: 0.5000 | ORAL_TABLET | ORAL | 0 refills | Status: DC | PRN

## 2023-02-02 SURGICAL SUPPLY — 24 items
BAG DRAIN URO TABLE W/ADPT NS (BAG) ×2 IMPLANT
BAG DRN 8 ADPR NS SKTRN CSTL (BAG) ×2
BAG HAMPER (MISCELLANEOUS) ×2 IMPLANT
CLOTH BEACON ORANGE TIMEOUT ST (SAFETY) ×2 IMPLANT
ELECT LOOP 22F BIPOLAR SML (ELECTROSURGICAL) ×2
ELECTRODE LOOP 22F BIPOLAR SML (ELECTROSURGICAL) ×2 IMPLANT
GLOVE BIO SURGEON STRL SZ8 (GLOVE) ×2 IMPLANT
GLOVE BIOGEL PI IND STRL 6.5 (GLOVE) IMPLANT
GLOVE BIOGEL PI IND STRL 7.0 (GLOVE) ×4 IMPLANT
GOWN STRL REUS W/TWL LRG LVL3 (GOWN DISPOSABLE) ×4 IMPLANT
GOWN STRL REUS W/TWL XL LVL3 (GOWN DISPOSABLE) ×2 IMPLANT
IV NS IRRIG 3000ML ARTHROMATIC (IV SOLUTION) ×4 IMPLANT
KIT TURNOVER CYSTO (KITS) ×2 IMPLANT
MANIFOLD NEPTUNE II (INSTRUMENTS) ×2 IMPLANT
PACK CYSTO (CUSTOM PROCEDURE TRAY) ×2 IMPLANT
PAD ARMBOARD 7.5X6 YLW CONV (MISCELLANEOUS) ×2 IMPLANT
PAD TELFA 3X4 1S STER (GAUZE/BANDAGES/DRESSINGS) IMPLANT
POSITIONER HEAD 8X9X4 ADT (SOFTGOODS) ×2 IMPLANT
SYR 10ML LL (SYRINGE) IMPLANT
SYR 30ML LL (SYRINGE) ×2 IMPLANT
SYR TOOMEY IRRIG 70ML (MISCELLANEOUS) ×2
SYRINGE TOOMEY IRRIG 70ML (MISCELLANEOUS) ×2 IMPLANT
TOWEL OR 17X26 4PK STRL BLUE (TOWEL DISPOSABLE) ×2 IMPLANT
WATER STERILE IRR 500ML POUR (IV SOLUTION) ×2 IMPLANT

## 2023-02-02 NOTE — Op Note (Addendum)
.  Preoperative diagnosis: bladder tumor  Postoperative diagnosis: Same  Procedure: 1 cystoscopy 2. Transurethral resection of bladder tumor small  Attending: Cleda Mccreedy  Anesthesia: General  Estimated blood loss: Minimal  Drains: none  Specimens: bladder tumor  Antibiotics: ancef  Findings: 4-23mm papillary left dome tumor tumor.  Ureteral orifices in normal anatomic location.   Indications: Patient is a 62 year old male with a history of recurrent bladder tumor and gross hematuria.  After discussing treatment options, they decided proceed with transurethral resection of a bladder tumor.  Procedure in detail: The patient was brought to the operating room and a brief timeout was done to ensure correct patient, correct procedure, correct site.  General anesthesia was administered patient was placed in dorsal lithotomy position.  Their genitalia was then prepped and draped in usual sterile fashion.  A rigid 22 French cystoscope was passed in the urethra and the bladder.  Bladder was inspected and we noted a 4mm bladder tumor.  the ureteral orifices were in the normal orthotopic locations.  Using the bipolar resectoscope we removed the bladder tumor down to the base. A subsequent muscle deep biopsy was then taken. Hemostasis was then obtained with electrocautery. We then removed the bladder tumor chips and sent them for pathology. We then re-inspected the bladder and found no residual bleeding.  the bladder was then drained and this concluded the procedure which was well tolerated by patient. Gemcitabine was not administered since this was his sixth recurrance  Complications: None  Condition: Stable, extubated, transferred to PACU  Plan: Patient is to be discharged home and followup in 5 days for pathology discussion.

## 2023-02-02 NOTE — Anesthesia Procedure Notes (Signed)
Procedure Name: Intubation Date/Time: 02/02/2023 7:48 AM  Performed by: Oletha Cruel, CRNAPre-anesthesia Checklist: Patient identified, Emergency Drugs available, Suction available and Patient being monitored Patient Re-evaluated:Patient Re-evaluated prior to induction Oxygen Delivery Method: Circle system utilized Preoxygenation: Pre-oxygenation with 100% oxygen Induction Type: IV induction Ventilation: Mask ventilation without difficulty Laryngoscope Size: Glidescope and 3 Grade View: Grade III Tube type: Oral Number of attempts: 1 Airway Equipment and Method: Video-laryngoscopy Placement Confirmation: ETT inserted through vocal cords under direct vision, positive ETCO2, CO2 detector and breath sounds checked- equal and bilateral Tube secured with: Tape Dental Injury: Teeth and Oropharynx as per pre-operative assessment  Comments: Direct laryngoscopy x 1. Poor view due to floppy epiglottis Atraumatic intubation with Glidescope.Difficulty discerning glottis. Positive bilateral breath sounds. Positive end tidal and chest rise. Teeth and lips remain in preoperative condition.

## 2023-02-02 NOTE — Transfer of Care (Signed)
Immediate Anesthesia Transfer of Care Note  Patient: Christian [name]  Procedure(s) Performed: CYSTOSCOPY TRANSURETHRAL RESECTION OF BLADDER TUMOR (TURBT) (Bladder)  Patient Location: PACU  Anesthesia Type:General  Level of Consciousness: awake and patient cooperative  Airway & Oxygen Therapy: Patient Spontanous Breathing  Post-op Assessment: Report given to RN and Post -op Vital signs reviewed and stable  Post vital signs: Reviewed and stable  Last Vitals:  Vitals Value Taken Time  BP 156/107 02/02/23 0833  Temp 98.3 02/02/23   0833  Pulse 83 02/02/23 0835  Resp 19 02/02/23 0835  SpO2 97 % 02/02/23 0835  Vitals shown include unfiled device data.  Last Pain:  Vitals:   02/02/23 0640  TempSrc: Oral  PainSc: 0-No pain      Patients Stated Pain Goal: 5 (02/02/23 0640)  Complications: No notable events documented.

## 2023-02-02 NOTE — Anesthesia Preprocedure Evaluation (Signed)
Anesthesia Evaluation  Patient identified by MRN, date of birth, ID band Patient awake    Reviewed: Allergy & Precautions, H&P , NPO status , Patient's Chart, lab work & pertinent test results, reviewed documented beta blocker date and time   Airway Mallampati: II  TM Distance: >3 FB Neck ROM: full    Dental no notable dental hx.    Pulmonary neg pulmonary ROS, shortness of breath, asthma , sleep apnea , former smoker   Pulmonary exam normal breath sounds clear to auscultation       Cardiovascular Exercise Tolerance: Good hypertension, + CAD  negative cardio ROS + dysrhythmias  Rhythm:regular Rate:Normal     Neuro/Psych  PSYCHIATRIC DISORDERS Anxiety Depression     Neuromuscular disease negative neurological ROS  negative psych ROS   GI/Hepatic negative GI ROS, Neg liver ROS,GERD  ,,  Endo/Other  negative endocrine ROS    Renal/GU negative Renal ROS  negative genitourinary   Musculoskeletal   Abdominal   Peds  Hematology negative hematology ROS (+)   Anesthesia Other Findings   Reproductive/Obstetrics negative OB ROS                             Anesthesia Physical Anesthesia Plan  ASA: 2  Anesthesia Plan: General and General LMA   Post-op Pain Management:    Induction:   PONV Risk Score and Plan: Ondansetron  Airway Management Planned:   Additional Equipment:   Intra-op Plan:   Post-operative Plan:   Informed Consent: I have reviewed the patients History and Physical, chart, labs and discussed the procedure including the risks, benefits and alternatives for the proposed anesthesia with the patient or authorized representative who has indicated his/her understanding and acceptance.     Dental Advisory Given  Plan Discussed with: CRNA  Anesthesia Plan Comments:        Anesthesia Quick Evaluation
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
## 2023-02-03 ENCOUNTER — Encounter (HOSPITAL_COMMUNITY): Payer: Self-pay | Admitting: Urology

## 2023-02-03 LAB — SURGICAL PATHOLOGY

## 2023-02-06 NOTE — Anesthesia Postprocedure Evaluation (Signed)
Anesthesia Post Note  Patient: Christian [name]  Procedure(s) Performed: CYSTOSCOPY TRANSURETHRAL RESECTION OF BLADDER TUMOR (TURBT) (Bladder)  Patient location during evaluation: Phase II Anesthesia Type: General Level of consciousness: awake Pain management: pain level controlled Vital Signs Assessment: post-procedure vital signs reviewed and stable Respiratory status: spontaneous breathing and respiratory function stable Cardiovascular status: blood pressure returned to baseline and stable Postop Assessment: no headache and no apparent nausea or vomiting Anesthetic complications: no Comments: Late entry   No notable events documented.   Last Vitals:  Vitals:   02/02/23 0900 02/02/23 0904  BP: (!) 138/92 (!) 132/118  Pulse: 76 73  Resp: 19 19  Temp:  36.5 C  SpO2: 96% 97%    Last Pain:  Vitals:   02/02/23 0904  TempSrc: Oral  PainSc: 0-No pain                 Windell Norfolk

## 2023-02-08 ENCOUNTER — Encounter: Payer: 59 | Admitting: Urology

## 2023-02-15 ENCOUNTER — Other Ambulatory Visit: Payer: 59 | Admitting: Urology

## 2023-02-16 ENCOUNTER — Telehealth: Payer: Self-pay | Admitting: Internal Medicine

## 2023-02-16 NOTE — Telephone Encounter (Signed)
Prescription Request  02/16/2023  LOV: 11/14/2022  What is the name of the medication or equipment? HYDROcodone-acetaminophen (NORCO) 7.5-325 MG tablet  clonazePAM (KLONOPIN) 1 MG tablet   Have you contacted your pharmacy to request a refill? No   Which pharmacy would you like this sent to?  [id number] - [place], [place] - [address] [place] [address] Phone: [phone] Fax: [phone]    Patient notified that their request is being sent to the clinical staff for review and that they should receive a response within 2 business days.   Please advise at Mobile [phone] (mobile)

## 2023-02-20 MED ORDER — HYDROCODONE-ACETAMINOPHEN 7.5-325 MG PO TABS: 0.5000 | ORAL_TABLET | Freq: Four times a day (QID) | ORAL | 0 refills | Status: DC | PRN

## 2023-02-20 MED ORDER — CLONAZEPAM 1 MG PO TABS: ORAL_TABLET | ORAL | 1 refills | Status: DC

## 2023-02-20 NOTE — Telephone Encounter (Signed)
Okay.  Keep return office visit.  Thanks 

## 2023-02-20 NOTE — Telephone Encounter (Signed)
Patient's wife called to check on the status of the refill request. Best callback for Alan Ripper is [phone]. Best callback for patient is [phone].

## 2023-03-15 ENCOUNTER — Other Ambulatory Visit: Payer: 59 | Admitting: Urology

## 2023-03-21 ENCOUNTER — Ambulatory Visit: Payer: 59 | Admitting: Internal Medicine

## 2023-04-06 ENCOUNTER — Other Ambulatory Visit: Payer: Self-pay | Admitting: Internal Medicine

## 2023-04-06 NOTE — Telephone Encounter (Signed)
Patient also needs a refill on the hydrocodone - please send to same pharmacy

## 2023-05-01 ENCOUNTER — Other Ambulatory Visit (INDEPENDENT_AMBULATORY_CARE_PROVIDER_SITE_OTHER): Payer: 59

## 2023-05-01 ENCOUNTER — Other Ambulatory Visit: Payer: Self-pay

## 2023-05-01 DIAGNOSIS — E785 Hyperlipidemia, unspecified: Secondary | ICD-10-CM

## 2023-05-01 DIAGNOSIS — E291 Testicular hypofunction: Secondary | ICD-10-CM

## 2023-05-01 DIAGNOSIS — R739 Hyperglycemia, unspecified: Secondary | ICD-10-CM

## 2023-05-01 LAB — COMPREHENSIVE METABOLIC PANEL
ALT: 13 U/L (ref 0–53)
AST: 16 U/L (ref 0–37)
Albumin: 4.6 g/dL (ref 3.5–5.2)
Alkaline Phosphatase: 51 U/L (ref 39–117)
BUN: 16 mg/dL (ref 6–23)
CO2: 30 meq/L (ref 19–32)
Calcium: 9.6 mg/dL (ref 8.4–10.5)
Chloride: 102 meq/L (ref 96–112)
Creatinine, Ser: 0.92 mg/dL (ref 0.40–1.50)
GFR: 90.43 mL/min (ref >60.00)
Glucose, Bld: 90 mg/dL (ref 70–99)
Potassium: 4.7 meq/L (ref 3.5–5.1)
Sodium: 139 meq/L (ref 135–145)
Total Bilirubin: 0.8 mg/dL (ref 0.2–1.2)
Total Protein: 6.8 g/dL (ref 6.0–8.3)

## 2023-05-01 LAB — LIPID PANEL
Cholesterol: 162 mg/dL (ref 0–200)
HDL: 49.3 mg/dL (ref >39.00)
LDL Cholesterol: 97 mg/dL (ref 0–99)
NonHDL: 112.56
Total CHOL/HDL Ratio: 3
Triglycerides: 76 mg/dL (ref 0.0–149.0)
VLDL: 15.2 mg/dL (ref 0.0–40.0)

## 2023-05-01 LAB — TESTOSTERONE: Testosterone: 281.42 ng/dL — ABNORMAL LOW (ref 300.00–890.00)

## 2023-05-01 LAB — PSA: PSA: 0.93 ng/mL (ref 0.10–4.00)

## 2023-05-01 LAB — HEMOGLOBIN A1C: Hgb A1c MFr Bld: 5.2 % (ref 4.6–6.5)

## 2023-05-02 ENCOUNTER — Ambulatory Visit: Payer: 59 | Admitting: Internal Medicine

## 2023-05-02 ENCOUNTER — Encounter: Payer: Self-pay | Admitting: Internal Medicine

## 2023-05-02 VITALS — BP 150/102 | HR 78 | Temp 98.6°F | Ht 68.0 in | Wt 213.0 lb

## 2023-05-02 DIAGNOSIS — E559 Vitamin D deficiency, unspecified: Secondary | ICD-10-CM

## 2023-05-02 DIAGNOSIS — M545 Low back pain, unspecified: Secondary | ICD-10-CM | POA: Diagnosis not present

## 2023-05-02 DIAGNOSIS — G8929 Other chronic pain: Secondary | ICD-10-CM

## 2023-05-02 DIAGNOSIS — F419 Anxiety disorder, unspecified: Secondary | ICD-10-CM

## 2023-05-02 DIAGNOSIS — R413 Other amnesia: Secondary | ICD-10-CM

## 2023-05-02 DIAGNOSIS — I2510 Atherosclerotic heart disease of native coronary artery without angina pectoris: Secondary | ICD-10-CM | POA: Diagnosis not present

## 2023-05-02 DIAGNOSIS — I2583 Coronary atherosclerosis due to lipid rich plaque: Secondary | ICD-10-CM

## 2023-05-02 MED ORDER — TESTOSTERONE CYPIONATE 200 MG/ML IM SOLN: INTRAMUSCULAR | 1 refills | Status: DC

## 2023-05-02 MED ORDER — TIRZEPATIDE 10 MG/0.5ML ~~LOC~~ SOAJ: 10.0000 mg | SUBCUTANEOUS | 3 refills | Status: DC

## 2023-05-02 MED ORDER — HYDROCODONE-ACETAMINOPHEN 7.5-325 MG PO TABS: 0.5000 | ORAL_TABLET | Freq: Four times a day (QID) | ORAL | 0 refills | Status: DC | PRN

## 2023-05-02 MED ORDER — INDOMETHACIN 50 MG PO CAPS: ORAL_CAPSULE | ORAL | 1 refills | Status: DC

## 2023-05-02 MED ORDER — CLONAZEPAM 1 MG PO TABS: ORAL_TABLET | ORAL | 1 refills | Status: DC

## 2023-05-02 MED ORDER — OLMESARTAN MEDOXOMIL 20 MG PO TABS: 20.0000 mg | ORAL_TABLET | Freq: Every day | ORAL | 3 refills | Status: DC

## 2023-05-02 MED ORDER — ATORVASTATIN CALCIUM 20 MG PO TABS: 20.0000 mg | ORAL_TABLET | Freq: Every day | ORAL | 3 refills | Status: DC

## 2023-05-02 NOTE — Progress Notes (Signed)
Subjective:  Patient ID: Christian [name], male    DOB: Aug 05, 1960  Age: 62 y.o. MRN: [id number]  CC: Medical Management of Chronic Issues (3 mnth f/u)   HPI [name] presents for DM, HTN, dyslipidemia f/u  Outpatient Medications Prior to Visit  Medication Sig Dispense Refill   acetaminophen (TYLENOL) 500 MG tablet Take 1,000 mg by mouth every 8 (eight) hours as needed for moderate pain.     aspirin EC 81 MG tablet Take 81 mg by mouth daily. Swallow whole.     Carboxymethylcellulose Sodium (EYE DROPS OP) Place 1 drop into both eyes daily as needed (irritation).     atorvastatin (LIPITOR) 20 MG tablet Take 1 tablet (20 mg total) by mouth daily. 90 tablet 3   clonazePAM (KLONOPIN) 1 MG tablet TAKE 1 TABLET BY MOUTH TWICE A DAY AS NEEDED FOR ANXIETY 60 tablet 1   fenofibrate (TRICOR) 145 MG tablet Take 1 tablet (145 mg total) by mouth daily. 90 tablet 3   HYDROcodone-acetaminophen (NORCO) 7.5-325 MG tablet Take 0.5-1 tablets by mouth every 6 (six) hours as needed for moderate pain or severe pain. 60 tablet 0   indomethacin (INDOCIN) 50 MG capsule TAKE 1 CAPSULE BY MOUTH THREE TIMES DAILY AS NEEDED FOR MODERATE PAIN 30 capsule 1   testosterone cypionate (DEPOTESTOSTERONE CYPIONATE) 200 MG/ML injection ADMINISTER 1 ML IN THE MUSCLE EVERY 14 DAYS 10 mL 1   tirzepatide (MOUNJARO) 10 MG/0.5ML Pen Inject 10 mg into the skin once a week. 6 mL 3   No facility-administered medications prior to visit.    ROS: Review of Systems  Constitutional:  Negative for appetite change, fatigue and unexpected weight change.  HENT:  Negative for congestion, nosebleeds, sneezing, sore throat and trouble swallowing.   Eyes:  Negative for itching and visual disturbance.  Respiratory:  Negative for cough.   Cardiovascular:  Negative for chest pain, palpitations and leg swelling.  Gastrointestinal:  Negative for abdominal distention, blood in stool, diarrhea and nausea.  Genitourinary:  Negative for  frequency and hematuria.  Musculoskeletal:  Positive for back pain. Negative for gait problem, joint swelling and neck pain.  Skin:  Negative for rash.  Neurological:  Negative for dizziness, tremors, speech difficulty and weakness.  Psychiatric/Behavioral:  Negative for agitation, dysphoric mood and sleep disturbance. The patient is not nervous/anxious.     Objective:  BP (!) 150/102 (BP Location: Left Arm, Patient Position: Sitting, Cuff Size: Normal)   Pulse 78   Temp 98.6 F (37 C) (Oral)   Ht 5\' 8"  (1.727 m)   Wt 213 lb (96.6 kg)   SpO2 96%   BMI 32.39 kg/m   BP Readings from Last 3 Encounters:  05/02/23 (!) 150/102  02/02/23 (!) 132/118  02/01/23 (!) 167/89    Wt Readings from Last 3 Encounters:  05/02/23 213 lb (96.6 kg)  02/01/23 207 lb 0.2 oz (93.9 kg)  11/14/22 207 lb (93.9 kg)    Physical Exam Constitutional:      General: He is not in acute distress.    Appearance: Normal appearance. He is well-developed.     Comments: NAD  Eyes:     Conjunctiva/sclera: Conjunctivae normal.     Pupils: Pupils are equal, round, and reactive to light.  Neck:     Thyroid: No thyromegaly.     Vascular: No JVD.  Cardiovascular:     Rate and Rhythm: Normal rate and regular rhythm.     Heart sounds: Normal heart sounds. No murmur  heard.    No friction rub. No gallop.  Pulmonary:     Effort: Pulmonary effort is normal. No respiratory distress.     Breath sounds: Normal breath sounds. No wheezing or rales.  Chest:     Chest wall: No tenderness.  Abdominal:     General: Bowel sounds are normal. There is no distension.     Palpations: Abdomen is soft. There is no mass.     Tenderness: There is no abdominal tenderness. There is no guarding or rebound.  Musculoskeletal:        General: No tenderness. Normal range of motion.     Cervical back: Normal range of motion.  Lymphadenopathy:     Cervical: No cervical adenopathy.  Skin:    General: Skin is warm and dry.      Findings: No rash.  Neurological:     Mental Status: He is alert and oriented to person, place, and time.     Cranial Nerves: No cranial nerve deficit.     Motor: No abnormal muscle tone.     Coordination: Coordination normal.     Gait: Gait normal.     Deep Tendon Reflexes: Reflexes are normal and symmetric.  Psychiatric:        Behavior: Behavior normal.        Thought Content: Thought content normal.        Judgment: Judgment normal.     Lab Results  Component Value Date   WBC 6.6 10/01/2021   HGB 16.3 10/01/2021   HCT 48.4 10/01/2021   PLT 162.0 10/01/2021   GLUCOSE 90 05/01/2023   CHOL 162 05/01/2023   TRIG 76.0 05/01/2023   HDL 49.30 05/01/2023   LDLDIRECT 191.0 03/22/2021   LDLCALC 97 05/01/2023   ALT 13 05/01/2023   AST 16 05/01/2023   NA 139 05/01/2023   K 4.7 05/01/2023   CL 102 05/01/2023   CREATININE 0.92 05/01/2023   BUN 16 05/01/2023   CO2 30 05/01/2023   TSH 1.40 04/05/2022   PSA 0.93 05/01/2023   INR 1.01 03/28/2012   HGBA1C 5.2 05/01/2023    No results found.  Assessment & Plan:   Problem List Items Addressed This Visit     Anxiety - Primary   Klonopin - occasional use prn - not w/alcohol  Potential benefits of a long term benzodiazepines  use as well as potential risks  and complications were explained to the patient and were aknowledged.       Low back pain   Norco as needed rare use  Potential benefits of a long term opioids use as well as potential risks (i.e. addiction risk, apnea etc) and complications (i.e. Somnolence, constipation and others) were explained to the patient and were aknowledged. Indocin - rare use      Relevant Medications   HYDROcodone-acetaminophen (NORCO) 7.5-325 MG tablet   indomethacin (INDOCIN) 50 MG capsule   Vitamin D deficiency   On Vit D      Coronary artery disease   Cont on Lipitor, ASA 81 Check lipids      Relevant Medications   olmesartan (BENICAR) 20 MG tablet   atorvastatin (LIPITOR) 20 MG  tablet   Memory changes   Doing well         Meds ordered this encounter  Medications   olmesartan (BENICAR) 20 MG tablet    Sig: Take 1 tablet (20 mg total) by mouth daily.    Dispense:  90 tablet    Refill:  3   atorvastatin (LIPITOR) 20 MG tablet    Sig: Take 1 tablet (20 mg total) by mouth daily.    Dispense:  90 tablet    Refill:  3   clonazePAM (KLONOPIN) 1 MG tablet    Sig: TAKE 1 TABLET BY MOUTH TWICE A DAY AS NEEDED FOR ANXIETY    Dispense:  60 tablet    Refill:  1    This request is for a new prescription for a controlled substance as required by Federal/State law.[name]   HYDROcodone-acetaminophen (NORCO) 7.5-325 MG tablet    Sig: Take 0.5-1 tablets by mouth every 6 (six) hours as needed for moderate pain (pain score 4-6) or severe pain (pain score 7-10).    Dispense:  60 tablet    Refill:  0    Office visit every 3 months   indomethacin (INDOCIN) 50 MG capsule    Sig: TAKE 1 CAPSULE BY MOUTH THREE TIMES DAILY AS NEEDED FOR MODERATE PAIN    Dispense:  30 capsule    Refill:  1   testosterone cypionate (DEPOTESTOSTERONE CYPIONATE) 200 MG/ML injection    Sig: ADMINISTER 1 ML IN THE MUSCLE EVERY 10 DAYS    Dispense:  10 mL    Refill:  1   tirzepatide (MOUNJARO) 10 MG/0.5ML Pen    Sig: Inject 10 mg into the skin once a week.    Dispense:  6 mL    Refill:  3      Follow-up: Return in about 3 months (around 07/31/2023) for a follow-up visit.  Sonda Primes, MD

## 2023-05-02 NOTE — Assessment & Plan Note (Signed)
Cont on Lipitor, ASA 81 Check lipids

## 2023-05-02 NOTE — Assessment & Plan Note (Signed)
Norco as needed rare use  Potential benefits of a long term opioids use as well as potential risks (i.e. addiction risk, apnea etc) and complications (i.e. Somnolence, constipation and others) were explained to the patient and were aknowledged. Indocin - rare use

## 2023-05-02 NOTE — Assessment & Plan Note (Signed)
Doing well 

## 2023-05-02 NOTE — Assessment & Plan Note (Signed)
Klonopin - occasional use prn - not w/alcohol  Potential benefits of a long term benzodiazepines  use as well as potential risks  and complications were explained to the patient and were aknowledged.  

## 2023-05-02 NOTE — Assessment & Plan Note (Signed)
On Vit D 

## 2023-05-05 ENCOUNTER — Encounter: Payer: Self-pay | Admitting: Internal Medicine

## 2023-05-08 ENCOUNTER — Other Ambulatory Visit: Payer: Self-pay

## 2023-05-08 MED ORDER — ALBUTEROL SULFATE HFA 108 (90 BASE) MCG/ACT IN AERS: 2.0000 | INHALATION_SPRAY | Freq: Four times a day (QID) | RESPIRATORY_TRACT | 5 refills | Status: DC | PRN

## 2023-05-19 ENCOUNTER — Telehealth: Payer: Self-pay

## 2023-05-19 NOTE — Telephone Encounter (Signed)
 Copied from CRM [id number]. Topic: Clinical - Medical Advice >> May 19, 2023  7:53 AM Curlee DEL wrote: Reason for CRM: Patient is experiencing a sore throat and body aches, patient has tried Over the counter medication but is not helping. They'd like to know if Dr. Garald can call in a z-pack for the patient. If so, they'd like it to be called into the following pharmacy:   [id number] - [place], [place] - [address]

## 2023-05-21 MED ORDER — AZITHROMYCIN 250 MG PO TABS: ORAL_TABLET | ORAL | 0 refills | Status: DC

## 2023-05-21 NOTE — Addendum Note (Signed)
 Addended by: Tresa Garter on: 05/21/2023 10:48 PM   Modules accepted: Orders

## 2023-05-21 NOTE — Telephone Encounter (Signed)
Okay Z-Pak.  Thanks 

## 2023-07-14 ENCOUNTER — Telehealth: Payer: Self-pay

## 2023-07-14 NOTE — Telephone Encounter (Signed)
 Copied from CRM [id number]. Topic: Clinical - Medication Refill >> Jul 14, 2023  1:35 PM Tiffany H wrote: Most Recent Primary Care Visit:  Provider: Tresa Garter  Department: [facility]  Visit Type: OFFICE VISIT  Date: 05/02/2023  Medication: - indomethacin (INDOCIN) 50 MG capsule [[id number]] - HYDROcodone-acetaminophen (NORCO) 7.5-325 MG tablet [[id number]] - testosterone cypionate (DEPOTESTOSTERONE CYPIONATE) 200 MG/ML injection [[id number]] - clonazePAM (KLONOPIN) 1 MG tablet [[id number]] - tirzepatide (MOUNJARO) 10 MG/0.5ML Pen [[id number]] - atorvastatin (LIPITOR) 20 MG tablet [[id number]]  Has the patient contacted their pharmacy? Yes (Agent: If no, request that the patient contact the pharmacy for the refill. If patient does not wish to contact the pharmacy document the reason why and proceed with request.) (Agent: If yes, when and what did the pharmacy advise?)  Is this the correct pharmacy for this prescription? Yes If no, delete pharmacy and type the correct one.  This is the patient's preferred pharmacy:   [facility]  Address: [address], [place] [address] Phone: [phone]  Patient was having problems with [facility] pharmacy keeping certain items in stock to avoid delays. [facility]. Please write new prescriptions, if possible, as [facility] is refusing to transfer prescriptions that had not yet needed to be refilled.    Has the prescription been filled recently? No  Is the patient out of the medication? No  Has the patient been seen for an appointment in the last year OR does the patient have an upcoming appointment? Yes  Can we respond through MyChart? No  Agent: Please be advised that Rx refills may take up to 3 business days. We ask that you follow-up with your pharmacy.

## 2023-07-17 MED ORDER — ATORVASTATIN CALCIUM 20 MG PO TABS: 20.0000 mg | ORAL_TABLET | Freq: Every day | ORAL | 3 refills | Status: AC

## 2023-07-17 MED ORDER — INDOMETHACIN 50 MG PO CAPS: ORAL_CAPSULE | ORAL | 1 refills | Status: DC

## 2023-07-17 MED ORDER — TIRZEPATIDE 10 MG/0.5ML ~~LOC~~ SOAJ: 10.0000 mg | SUBCUTANEOUS | 3 refills | Status: DC

## 2023-07-17 MED ORDER — HYDROCODONE-ACETAMINOPHEN 7.5-325 MG PO TABS: 0.5000 | ORAL_TABLET | Freq: Four times a day (QID) | ORAL | 0 refills | Status: DC | PRN

## 2023-07-17 MED ORDER — TESTOSTERONE CYPIONATE 200 MG/ML IM SOLN: INTRAMUSCULAR | 1 refills | Status: DC

## 2023-07-17 NOTE — Telephone Encounter (Signed)
 Okay.  Done.  Thanks

## 2023-07-17 NOTE — Addendum Note (Signed)
 Addended by: Tresa Garter on: 07/17/2023 08:06 PM   Modules accepted: Orders

## 2023-07-24 ENCOUNTER — Other Ambulatory Visit: Payer: Self-pay | Admitting: Internal Medicine

## 2023-08-14 ENCOUNTER — Other Ambulatory Visit: Payer: Self-pay | Admitting: Internal Medicine

## 2023-08-31 ENCOUNTER — Ambulatory Visit: Payer: 59 | Admitting: Internal Medicine

## 2023-09-07 ENCOUNTER — Other Ambulatory Visit: Payer: Self-pay | Admitting: Orthopedic Surgery

## 2023-09-07 DIAGNOSIS — S82142A Displaced bicondylar fracture of left tibia, initial encounter for closed fracture: Secondary | ICD-10-CM | POA: Insufficient documentation

## 2023-09-08 ENCOUNTER — Ambulatory Visit: Payer: Self-pay
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
## 2023-09-11 NOTE — Telephone Encounter (Signed)
 LVM for patient or his wife to return our call -- patient has an upcoming appt with PCP if they wish to wait and discuss then

## 2023-09-12 ENCOUNTER — Other Ambulatory Visit: Payer: Self-pay | Admitting: Internal Medicine

## 2023-09-13 ENCOUNTER — Ambulatory Visit (INDEPENDENT_AMBULATORY_CARE_PROVIDER_SITE_OTHER): Admitting: Internal Medicine

## 2023-09-13 ENCOUNTER — Encounter: Payer: Self-pay | Admitting: Internal Medicine

## 2023-09-13 VITALS — BP 130/72 | HR 65 | Temp 98.3°F | Ht 68.0 in | Wt 209.0 lb

## 2023-09-13 DIAGNOSIS — E291 Testicular hypofunction: Secondary | ICD-10-CM | POA: Diagnosis not present

## 2023-09-13 DIAGNOSIS — M545 Low back pain, unspecified: Secondary | ICD-10-CM

## 2023-09-13 DIAGNOSIS — M25562 Pain in left knee: Secondary | ICD-10-CM | POA: Diagnosis not present

## 2023-09-13 DIAGNOSIS — G8929 Other chronic pain: Secondary | ICD-10-CM

## 2023-09-13 MED ORDER — TESTOSTERONE CYPIONATE 200 MG/ML IM SOLN: INTRAMUSCULAR | 1 refills | Status: DC

## 2023-09-13 MED ORDER — TIRZEPATIDE 12.5 MG/0.5ML ~~LOC~~ SOAJ: 12.5000 mg | SUBCUTANEOUS | 2 refills | Status: AC

## 2023-09-13 MED ORDER — OLMESARTAN MEDOXOMIL 20 MG PO TABS: 10.0000 mg | ORAL_TABLET | Freq: Every day | ORAL | Status: DC

## 2023-09-13 MED ORDER — CLONAZEPAM 1 MG PO TABS: ORAL_TABLET | ORAL | 1 refills | Status: DC

## 2023-09-13 MED ORDER — HYDROCODONE-ACETAMINOPHEN 7.5-325 MG PO TABS: 0.5000 | ORAL_TABLET | Freq: Four times a day (QID) | ORAL | 0 refills | Status: DC | PRN

## 2023-09-13 MED ORDER — ALBUTEROL SULFATE HFA 108 (90 BASE) MCG/ACT IN AERS: 2.0000 | INHALATION_SPRAY | Freq: Four times a day (QID) | RESPIRATORY_TRACT | 3 refills | Status: DC | PRN
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
## 2023-09-13 NOTE — Assessment & Plan Note (Signed)
Norco as needed rare use  Potential benefits of a long term opioids use as well as potential risks (i.e. addiction risk, apnea etc) and complications (i.e. Somnolence, constipation and others) were explained to the patient and were aknowledged. Indocin - rare use
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
## 2023-09-13 NOTE — Assessment & Plan Note (Signed)
 On  Testosterone   Potential benefits of a long term sex steroid  use as well as potential risks  and complications were explained to the patient and were aknowledged - OSA, MI etc.

## 2023-09-14 ENCOUNTER — Ambulatory Visit
Admission: RE | Admit: 2023-09-14 | Discharge: 2023-09-14 | Disposition: A | Discharge status: Home / Self Care | Source: Ambulatory Visit | Attending: Orthopedic Surgery | Admitting: Orthopedic Surgery

## 2023-09-14 DIAGNOSIS — S82142A Displaced bicondylar fracture of left tibia, initial encounter for closed fracture: Secondary | ICD-10-CM

## 2023-10-24 ENCOUNTER — Encounter: Payer: Self-pay | Admitting: Internal Medicine

## 2023-11-05 ENCOUNTER — Other Ambulatory Visit: Payer: Self-pay | Admitting: Internal Medicine

## 2023-11-26 ENCOUNTER — Other Ambulatory Visit: Payer: Self-pay | Admitting: Internal Medicine

## 2023-11-27 ENCOUNTER — Other Ambulatory Visit: Payer: Self-pay | Admitting: Internal Medicine

## 2023-11-27 NOTE — Telephone Encounter (Unsigned)
 Copied from CRM [id number]. Topic: Clinical - Medication Refill >> Nov 27, 2023 10:41 AM Macario HERO wrote: Medication: HYDROcodone -acetaminophen  (NORCO) 7.5-325 MG tablet [[id number]] clonazePAM  (KLONOPIN ) 1 MG tablet [[id number]]  Has the patient contacted their pharmacy? Yes (Agent: If no, request that the patient contact the pharmacy for the refill. If patient does not wish to contact the pharmacy document the reason why and proceed with request.) (Agent: If yes, when and what did the pharmacy advise?) Call provider  This is the patient's preferred pharmacy:  [id number] - [place], [place] - [id number] [place] [address] Phone: [phone] Fax: [phone]  Is this the correct pharmacy for this prescription? Yes If no, delete pharmacy and type the correct one.   Has the prescription been filled recently? Yes  Is the patient out of the medication? Yes  Has the patient been seen for an appointment in the last year OR does the patient have an upcoming appointment? Yes  Can we respond through MyChart? Yes  Agent: Please be advised that Rx refills may take up to 3 business days. We ask that you follow-up with your pharmacy.

## 2023-11-28 MED ORDER — CLONAZEPAM 1 MG PO TABS: ORAL_TABLET | ORAL | 1 refills | Status: DC

## 2023-11-28 MED ORDER — HYDROCODONE-ACETAMINOPHEN 7.5-325 MG PO TABS: 0.5000 | ORAL_TABLET | Freq: Four times a day (QID) | ORAL | 0 refills | Status: DC | PRN

## 2023-12-25 ENCOUNTER — Other Ambulatory Visit (INDEPENDENT_AMBULATORY_CARE_PROVIDER_SITE_OTHER)

## 2023-12-25 DIAGNOSIS — E291 Testicular hypofunction: Secondary | ICD-10-CM | POA: Diagnosis not present

## 2023-12-25 DIAGNOSIS — M545 Low back pain, unspecified: Secondary | ICD-10-CM | POA: Diagnosis not present

## 2023-12-25 DIAGNOSIS — G8929 Other chronic pain: Secondary | ICD-10-CM | POA: Diagnosis not present

## 2023-12-25 LAB — CBC WITH DIFFERENTIAL/PLATELET
Basophils Absolute: 0.1 K/uL (ref 0.0–0.1)
Basophils Relative: 0.9 % (ref 0.0–3.0)
Eosinophils Absolute: 0.6 K/uL (ref 0.0–0.7)
Eosinophils Relative: 9.9 % — ABNORMAL HIGH (ref 0.0–5.0)
HCT: 45.4 % (ref 39.0–52.0)
Hemoglobin: 15.4 g/dL (ref 13.0–17.0)
Lymphocytes Relative: 22.9 % (ref 12.0–46.0)
Lymphs Abs: 1.4 K/uL (ref 0.7–4.0)
MCHC: 33.9 g/dL (ref 30.0–36.0)
MCV: 97.6 fl (ref 78.0–100.0)
Monocytes Absolute: 0.6 K/uL (ref 0.1–1.0)
Monocytes Relative: 10.5 % (ref 3.0–12.0)
Neutro Abs: 3.4 K/uL (ref 1.4–7.7)
Neutrophils Relative %: 55.8 % (ref 43.0–77.0)
Platelets: 176 K/uL (ref 150.0–400.0)
RBC: 4.65 Mil/uL (ref 4.22–5.81)
RDW: 14.1 % (ref 11.5–15.5)
WBC: 6.1 K/uL (ref 4.0–10.5)

## 2023-12-25 LAB — COMPREHENSIVE METABOLIC PANEL WITH GFR
ALT: 13 U/L (ref 0–53)
AST: 16 U/L (ref 0–37)
Albumin: 4.2 g/dL (ref 3.5–5.2)
Alkaline Phosphatase: 49 U/L (ref 39–117)
BUN: 11 mg/dL (ref 6–23)
CO2: 28 meq/L (ref 19–32)
Calcium: 9.2 mg/dL (ref 8.4–10.5)
Chloride: 106 meq/L (ref 96–112)
Creatinine, Ser: 0.77 mg/dL (ref 0.40–1.50)
GFR: 96.88 mL/min (ref >60.00)
Glucose, Bld: 67 mg/dL — ABNORMAL LOW (ref 70–99)
Potassium: 4 meq/L (ref 3.5–5.1)
Sodium: 142 meq/L (ref 135–145)
Total Bilirubin: 0.4 mg/dL (ref 0.2–1.2)
Total Protein: 6.6 g/dL (ref 6.0–8.3)

## 2023-12-25 LAB — TESTOSTERONE: Testosterone: 500.25 ng/dL (ref 300.00–890.00)

## 2023-12-26 ENCOUNTER — Ambulatory Visit: Payer: Self-pay | Admitting: Internal Medicine

## 2023-12-27 ENCOUNTER — Encounter: Payer: Self-pay | Admitting: Internal Medicine

## 2023-12-27 ENCOUNTER — Ambulatory Visit: Admitting: Internal Medicine

## 2023-12-27 VITALS — BP 150/90 | HR 68 | Temp 98.1°F | Ht 68.0 in | Wt 209.0 lb

## 2023-12-27 DIAGNOSIS — E785 Hyperlipidemia, unspecified: Secondary | ICD-10-CM | POA: Diagnosis not present

## 2023-12-27 DIAGNOSIS — S82142D Displaced bicondylar fracture of left tibia, subsequent encounter for closed fracture with routine healing: Secondary | ICD-10-CM | POA: Diagnosis not present

## 2023-12-27 DIAGNOSIS — M545 Low back pain, unspecified: Secondary | ICD-10-CM

## 2023-12-27 DIAGNOSIS — I10 Essential (primary) hypertension: Secondary | ICD-10-CM

## 2023-12-27 DIAGNOSIS — I2510 Atherosclerotic heart disease of native coronary artery without angina pectoris: Secondary | ICD-10-CM

## 2023-12-27 DIAGNOSIS — E291 Testicular hypofunction: Secondary | ICD-10-CM

## 2023-12-27 DIAGNOSIS — I2583 Coronary atherosclerosis due to lipid rich plaque: Secondary | ICD-10-CM

## 2023-12-27 DIAGNOSIS — Z6830 Body mass index (BMI) 30.0-30.9, adult: Secondary | ICD-10-CM

## 2023-12-27 DIAGNOSIS — F419 Anxiety disorder, unspecified: Secondary | ICD-10-CM

## 2023-12-27 DIAGNOSIS — S82142S Displaced bicondylar fracture of left tibia, sequela: Secondary | ICD-10-CM

## 2023-12-27 DIAGNOSIS — E66811 Obesity, class 1: Secondary | ICD-10-CM

## 2023-12-27 DIAGNOSIS — G8929 Other chronic pain: Secondary | ICD-10-CM

## 2023-12-27 DIAGNOSIS — E6609 Other obesity due to excess calories: Secondary | ICD-10-CM

## 2023-12-27 MED ORDER — HYDROCODONE-ACETAMINOPHEN 7.5-325 MG PO TABS: 0.5000 | ORAL_TABLET | Freq: Four times a day (QID) | ORAL | 0 refills | Status: AC | PRN

## 2023-12-27 MED ORDER — BD LUER-LOK SYRINGE 23G X 1-1/2" 3 ML MISC: 3 refills | Status: AC

## 2023-12-27 MED ORDER — OLMESARTAN MEDOXOMIL 20 MG PO TABS: 20.0000 mg | ORAL_TABLET | Freq: Every day | ORAL | Status: DC

## 2023-12-27 NOTE — Assessment & Plan Note (Signed)
 Much better

## 2023-12-27 NOTE — Progress Notes (Signed)
 Subjective:  Patient ID: Christian [name], male    DOB: 23-Mar-1961  Age: 64 y.o. MRN: [id number]  CC: Medical Management of Chronic Issues (3 mnth f/u )   HPI Christian [name] presents for DM, HTN, L tibial plateau fx (May 2025), hypogonadism  Outpatient Medications Prior to Visit  Medication Sig Dispense Refill   acetaminophen  (TYLENOL ) 500 MG tablet Take 1,000 mg by mouth every 8 (eight) hours as needed for moderate pain.     albuterol  (VENTOLIN  HFA) 108 (90 Base) MCG/ACT inhaler INHALE 2 PUFFS INTO THE LUNGS EVERY 6 HOURS AS NEEDED FOR WHEEZING OR SHORTNESS OF BREATH 6.7 g 3   aspirin  EC 81 MG tablet Take 81 mg by mouth daily. Swallow whole.     atorvastatin  (LIPITOR) 20 MG tablet Take 1 tablet (20 mg total) by mouth daily. 90 tablet 3   Carboxymethylcellulose Sodium (EYE DROPS OP) Place 1 drop into both eyes daily as needed (irritation).     clonazePAM  (KLONOPIN ) 1 MG tablet TAKE 1 TABLET BY MOUTH TWICE A DAY AS NEEDED FOR ANXIETY 60 tablet 1   indomethacin  (INDOCIN ) 50 MG capsule TAKE 1 CAPSULE BY MOUTH THREE TIMES DAILY AS NEEDED FOR MODERATE PAIN 30 capsule 1   meloxicam (MOBIC) 7.5 MG tablet TAKE 1 TABLET BY MOUTH TWICE DAILY FOR 21 DAYS     testosterone  cypionate (DEPOTESTOSTERONE CYPIONATE) 200 MG/ML injection ADMINISTER 1 ML IN THE MUSCLE EVERY 10 DAYS 10 mL 1   tirzepatide  (MOUNJARO ) 12.5 MG/0.5ML Pen Inject 12.5 mg into the skin once a week. 6 mL 2   HYDROcodone -acetaminophen  (NORCO) 7.5-325 MG tablet Take 0.5-1 tablets by mouth every 6 (six) hours as needed for moderate pain (pain score 4-6) or severe pain (pain score 7-10). 60 tablet 0   olmesartan  (BENICAR ) 20 MG tablet Take 0.5 tablets (10 mg total) by mouth daily.     No facility-administered medications prior to visit.    ROS: Review of Systems  Constitutional:  Negative for appetite change, fatigue and unexpected weight change.  HENT:  Negative for congestion, nosebleeds, sneezing, sore throat and trouble swallowing.    Eyes:  Negative for itching and visual disturbance.  Respiratory:  Negative for cough.   Cardiovascular:  Negative for chest pain, palpitations and leg swelling.  Gastrointestinal:  Negative for abdominal distention, blood in stool, diarrhea and nausea.  Genitourinary:  Negative for frequency and hematuria.  Musculoskeletal:  Negative for back pain, gait problem, joint swelling and neck pain.  Skin:  Negative for rash.  Neurological:  Negative for dizziness, tremors, speech difficulty and weakness.  Psychiatric/Behavioral:  Negative for agitation, dysphoric mood and sleep disturbance. The patient is not nervous/anxious.     Objective:  BP (!) 150/90   Pulse 68   Temp 98.1 F (36.7 C) (Oral)   Ht 5' 8 (1.727 m)   Wt 209 lb (94.8 kg)   SpO2 99%   BMI 31.78 kg/m   BP Readings from Last 3 Encounters:  12/27/23 (!) 150/90  09/13/23 130/72  05/02/23 (!) 150/102    Wt Readings from Last 3 Encounters:  12/27/23 209 lb (94.8 kg)  09/13/23 209 lb (94.8 kg)  05/02/23 213 lb (96.6 kg)    Physical Exam Constitutional:      General: He is not in acute distress.    Appearance: Normal appearance. He is well-developed.     Comments: NAD  Eyes:     Conjunctiva/sclera: Conjunctivae normal.     Pupils: Pupils are equal, round,  and reactive to light.  Neck:     Thyroid : No thyromegaly.     Vascular: No JVD.  Cardiovascular:     Rate and Rhythm: Normal rate and regular rhythm.     Heart sounds: Normal heart sounds. No murmur heard.    No friction rub. No gallop.  Pulmonary:     Effort: Pulmonary effort is normal. No respiratory distress.     Breath sounds: Normal breath sounds. No wheezing or rales.  Chest:     Chest wall: No tenderness.  Abdominal:     General: Bowel sounds are normal. There is no distension.     Palpations: Abdomen is soft. There is no mass.     Tenderness: There is no abdominal tenderness. There is no guarding or rebound.  Musculoskeletal:        General:  No tenderness. Normal range of motion.     Cervical back: Normal range of motion.  Lymphadenopathy:     Cervical: No cervical adenopathy.  Skin:    General: Skin is warm and dry.     Findings: No rash.  Neurological:     Mental Status: He is alert and oriented to person, place, and time.     Cranial Nerves: No cranial nerve deficit.     Motor: No abnormal muscle tone.     Coordination: Coordination normal.     Gait: Gait normal.     Deep Tendon Reflexes: Reflexes are normal and symmetric.  Psychiatric:        Behavior: Behavior normal.        Thought Content: Thought content normal.        Judgment: Judgment normal.     Lab Results  Component Value Date   WBC 6.1 12/25/2023   HGB 15.4 12/25/2023   HCT 45.4 12/25/2023   PLT 176.0 12/25/2023   GLUCOSE 67 (L) 12/25/2023   CHOL 162 05/01/2023   TRIG 76.0 05/01/2023   HDL 49.30 05/01/2023   LDLDIRECT 191.0 03/22/2021   LDLCALC 97 05/01/2023   ALT 13 12/25/2023   AST 16 12/25/2023   NA 142 12/25/2023   K 4.0 12/25/2023   CL 106 12/25/2023   CREATININE 0.77 12/25/2023   BUN 11 12/25/2023   CO2 28 12/25/2023   TSH 1.40 04/05/2022   PSA 0.93 05/01/2023   INR 1.01 03/28/2012   HGBA1C 5.2 05/01/2023    CT KNEE LEFT WO CONTRAST Result Date: 09/14/2023 CLINICAL DATA:  Left knee injury 09/06/2023 EXAM: CT OF THE LEFT KNEE WITHOUT CONTRAST TECHNIQUE: Multidetector CT imaging of the left knee was performed according to the standard protocol. Multiplanar CT image reconstructions were also generated. RADIATION DOSE REDUCTION: This exam was performed according to the departmental dose-optimization program which includes automated exposure control, adjustment of the mA and/or kV according to patient size and/or use of iterative reconstruction technique. COMPARISON:  None Available. FINDINGS: Age advanced medial and lateral compartment degenerative changes with joint space narrowing osteophytic spurring subchondral cystic change. Large  subchondral geodes noted near the tibial spines. There is a nondisplaced nondepressed intra-articular fracture the lateral tibial plateau. No fracture of the femur, fibula or patella. Moderate to large joint effusion/lipohemarthrosis. Scattered chondrocalcinosis is noted. There are also small ossified loose bodies in the joint. Grossly by CT the PCL is intact. The ACL is not demonstrated. The medial and lateral collateral ligaments are intact. Areas of calcification involving the MCL suggesting prior injury. The quadriceps and patellar tendons are intact. Scattered vascular calcifications. IMPRESSION: 1. Nondisplaced,  nondepressed intra-articular fracture of the lateral tibial plateau. 2. Moderate to large joint effusion/lipohemarthrosis. 3. Age advanced medial and lateral compartment degenerative changes. 4. The ACL is well demonstrated. 5. Areas of calcification involving the MCL suggesting prior injury. Electronically Signed   By: MYRTIS Stammer M.D.   On: 09/14/2023 14:40    Assessment & Plan:   Problem List Items Addressed This Visit     Anxiety   Klonopin  - occasional use prn - not w/alcohol  Potential benefits of a long term benzodiazepines  use as well as potential risks  and complications were explained to the patient and were aknowledged.       Relevant Orders   Comprehensive metabolic panel with GFR   Closed fracture of left tibial plateau - Primary   Doing well      Coronary artery disease   Cont on Lipitor, ASA 81 Check lipids      Relevant Medications   olmesartan  (BENICAR ) 20 MG tablet   Other Relevant Orders   Lipid panel   Comprehensive metabolic panel with GFR   Dyslipidemia   Cont on Lipitor      Relevant Orders   TSH   Urinalysis   CBC with Differential/Platelet   Lipid panel   PSA   HTN (hypertension)   Worse. Go back to Olmesartan  20 mg/d      Relevant Medications   olmesartan  (BENICAR ) 20 MG tablet   Other Relevant Orders   Comprehensive metabolic  panel with GFR   Hypogonadism male   Relevant Orders   TSH   Lipid panel   Low back pain   Norco as needed rare use  Potential benefits of a long term opioids use as well as potential risks (i.e. addiction risk, apnea etc) and complications (i.e. Somnolence, constipation and others) were explained to the patient and were aknowledged. Indocin  - rare use      Relevant Medications   meloxicam (MOBIC) 7.5 MG tablet   HYDROcodone -acetaminophen  (NORCO) 7.5-325 MG tablet   Obesity   Much better         Meds ordered this encounter  Medications   HYDROcodone -acetaminophen  (NORCO) 7.5-325 MG tablet    Sig: Take 0.5-1 tablets by mouth every 6 (six) hours as needed for moderate pain (pain score 4-6) or severe pain (pain score 7-10).    Dispense:  60 tablet    Refill:  0    Office visit every 3 months   SYRINGE-NEEDLE, DISP, 3 ML (B-D 3CC LUER-LOK SYR 23GX1-1/2) 23G X 1-1/2 3 ML MISC    Sig: Use IM every 2 weeks    Dispense:  50 each    Refill:  3   olmesartan  (BENICAR ) 20 MG tablet    Sig: Take 1 tablet (20 mg total) by mouth daily.      Follow-up: Return in about 3 months (around 03/28/2024) for a follow-up visit.Virtual Visit via Video Note  I connected with Christian [name] on 12/27/23 at  7:50 AM EDT by a video enabled telemedicine application and verified that I am speaking with the correct person using two identifiers.   I discussed the limitations of evaluation and management by telemedicine and the availability of in person appointments. The patient expressed understanding and agreed to proceed.  I was located at our [facility] office. The patient was at home. There was no one else present in the visit.  Chief Complaint  Patient presents with   Medical Management of Chronic Issues    3  mnth f/u      History of Present Illness:   Review of Systems  Constitutional:  Negative for appetite change, fatigue and unexpected weight change.  HENT:  Negative for  congestion, nosebleeds, sneezing, sore throat and trouble swallowing.   Eyes:  Negative for itching and visual disturbance.  Respiratory:  Negative for cough.   Cardiovascular:  Negative for chest pain, palpitations and leg swelling.  Gastrointestinal:  Negative for abdominal distention, blood in stool, diarrhea and nausea.  Genitourinary:  Negative for frequency and hematuria.  Musculoskeletal:  Negative for back pain, gait problem, joint swelling and neck pain.  Skin:  Negative for rash.  Neurological:  Negative for dizziness, tremors, speech difficulty and weakness.  Psychiatric/Behavioral:  Negative for agitation, dysphoric mood and sleep disturbance. The patient is not nervous/anxious.      Observations/Objective: The patient appears to be in no acute distress  Assessment and Plan:  Problem List Items Addressed This Visit     Anxiety   Klonopin  - occasional use prn - not w/alcohol  Potential benefits of a long term benzodiazepines  use as well as potential risks  and complications were explained to the patient and were aknowledged.       Relevant Orders   Comprehensive metabolic panel with GFR   Closed fracture of left tibial plateau - Primary   Doing well      Coronary artery disease   Cont on Lipitor, ASA 81 Check lipids      Relevant Medications   olmesartan  (BENICAR ) 20 MG tablet   Other Relevant Orders   Lipid panel   Comprehensive metabolic panel with GFR   Dyslipidemia   Cont on Lipitor      Relevant Orders   TSH   Urinalysis   CBC with Differential/Platelet   Lipid panel   PSA   HTN (hypertension)   Worse. Go back to Olmesartan  20 mg/d      Relevant Medications   olmesartan  (BENICAR ) 20 MG tablet   Other Relevant Orders   Comprehensive metabolic panel with GFR   Hypogonadism male   Relevant Orders   TSH   Lipid panel   Low back pain   Norco as needed rare use  Potential benefits of a long term opioids use as well as potential risks (i.e.  addiction risk, apnea etc) and complications (i.e. Somnolence, constipation and others) were explained to the patient and were aknowledged. Indocin  - rare use      Relevant Medications   meloxicam (MOBIC) 7.5 MG tablet   HYDROcodone -acetaminophen  (NORCO) 7.5-325 MG tablet   Obesity   Much better        Meds ordered this encounter  Medications   HYDROcodone -acetaminophen  (NORCO) 7.5-325 MG tablet    Sig: Take 0.5-1 tablets by mouth every 6 (six) hours as needed for moderate pain (pain score 4-6) or severe pain (pain score 7-10).    Dispense:  60 tablet    Refill:  0    Office visit every 3 months   SYRINGE-NEEDLE, DISP, 3 ML (B-D 3CC LUER-LOK SYR 23GX1-1/2) 23G X 1-1/2 3 ML MISC    Sig: Use IM every 2 weeks    Dispense:  50 each    Refill:  3   olmesartan  (BENICAR ) 20 MG tablet    Sig: Take 1 tablet (20 mg total) by mouth daily.     Follow Up Instructions:    I discussed the assessment and treatment plan with the patient. The patient was provided an opportunity  to ask questions and all were answered. The patient agreed with the plan and demonstrated an understanding of the instructions.   The patient was advised to call back or seek an in-person evaluation if the symptoms worsen or if the condition fails to improve as anticipated.  I provided face-to-face time during this encounter. We were at different locations.   Marolyn Noel, MD   Marolyn Noel, MD

## 2023-12-27 NOTE — Assessment & Plan Note (Signed)
 Norco as needed rare use  Potential benefits of a long term opioids use as well as potential risks (i.e. addiction risk, apnea etc) and complications (i.e. Somnolence, constipation and others) were explained to the patient and were aknowledged. Indocin  - rare use

## 2023-12-27 NOTE — Assessment & Plan Note (Signed)
Klonopin - occasional use prn - not w/alcohol  Potential benefits of a long term benzodiazepines  use as well as potential risks  and complications were explained to the patient and were aknowledged.  

## 2023-12-27 NOTE — Assessment & Plan Note (Signed)
 Cont on Lipitor, ASA 81 Check lipids

## 2023-12-27 NOTE — Assessment & Plan Note (Signed)
 Worse. Go back to Olmesartan  20 mg/d

## 2023-12-27 NOTE — Assessment & Plan Note (Signed)
 Cont on Lipitor

## 2023-12-27 NOTE — Assessment & Plan Note (Signed)
 Doing well

## 2024-01-03 ENCOUNTER — Ambulatory Visit: Payer: Self-pay | Admitting: Internal Medicine

## 2024-01-03 ENCOUNTER — Ambulatory Visit: Payer: Self-pay

## 2024-01-03 NOTE — Telephone Encounter (Signed)
 FYI Only or Action Required?: Action required by provider: wife would like call back on her cell phone.  Wants to know if they should increase dose to twice daily.  .  Patient was last seen in primary care on 12/27/2023 by Plotnikov, Karlynn GAILS, MD.  Called Nurse Triage reporting Hypertension.  Symptoms began yesterday.  Interventions attempted: Nothing.  Symptoms are: gradually worsening.  Triage Disposition: Urgent Home Treatment With Follow-up Call  Patient/caregiver understands and will follow disposition?: Yes           Copied from CRM #[id number]. Topic: Clinical - Red Word Triage >> Jan 03, 2024  7:35 AM Christian [name] wrote: Red Word that prompted transfer to Nurse Triage: pt saw Dr.. last week for high blood pressure, wife calling in stating it is high again, last reading around 178/114 Reason for Disposition  [1] Systolic BP >= 180 OR Diastolic >= 110 AND [2] missed most recent dose of blood pressure medication  Answer Assessment - Initial Assessment Questions 1. BLOOD PRESSURE: What is your blood pressure? Did you take at least two measurements 5 minutes apart?     178/114 2. ONSET: When did you take your blood pressure?     Last night 3. HOW: How did you take your blood pressure? (e.g., automatic home BP monitor, visiting nurse)     Automatic cuff 4. HISTORY: Do you have [name] history of high blood pressure?     yes 5. MEDICINES: Are you taking any medicines for blood pressure? Have you missed any doses recently?     denies 6. OTHER SYMPTOMS: Do you have any symptoms? (e.g., blurred vision, chest pain, difficulty breathing, headache, weakness)     Headache, face flushed, eyes are bothering him 7. PREGNANCY: Is there any chance you are pregnant? When was your last menstrual period?     na  Protocols used: Blood Pressure - High-[name]-AH

## 2024-01-03 NOTE — Telephone Encounter (Signed)
 178/105 pts last BP read. Pt states he is having headaches, dizziness and feels as though he is about to have a nose bleed sensation. Pts wife asked should pt up his dose of the Olmesartan  from 1 pill a day?

## 2024-01-03 NOTE — Telephone Encounter (Signed)
 FYI Only or Action Required?: Action required by provider: update on patient condition.  Patient was last seen in primary care on 12/27/2023 by Plotnikov, Karlynn GAILS, MD.  Called Nurse Triage reporting Hypertension.  Triage Disposition: Go to ED Now (Notify PCP)  Patient/caregiver understands and will follow disposition?: No, refuses disposition          Copied from CRM [id number]. Topic: Clinical - Red Word Triage >> Jan 03, 2024  3:49 PM Christian [name] wrote: Red Word that prompted transfer to Nurse Triage: BP still extremely high 171/110 Reason for Disposition  [1] Systolic BP >= 160 OR Diastolic >= 100 AND [2] cardiac (e.g., breathing difficulty, chest pain) or neurologic symptoms (e.g., new-onset blurred or double vision, unsteady gait)  Answer Assessment - Initial Assessment Questions This RN spoke with pt's wife. Pt wife states she had called this morning. Pt states she reviewed his blood work and saw things that were high and concerning to her. Pt wants to know what to do. This RN recommends ED and pt wife refuses this disposition. Pt wife wants to speak with PCP.  Please call back pt wife, Christian [name] [phone]    BLOOD PRESSURE: What is your blood pressure? Did you take at least two measurements 5 minutes apart?     171/110 now  HISTORY: Do you have a history of high blood pressure?     Yes  MEDICINES: Are you taking any medicines for blood pressure? Have you missed any doses recently?     No  OTHER SYMPTOMS: Do you have any symptoms? (e.g., blurred vision, chest pain, difficulty breathing, headache, weakness)     Headache and a little bit of blurry vision, flushed in face  Protocols used: Blood Pressure - High-A-AH

## 2024-01-08 ENCOUNTER — Other Ambulatory Visit: Payer: Self-pay | Admitting: Internal Medicine

## 2024-01-08 MED ORDER — OLMESARTAN MEDOXOMIL 40 MG PO TABS: 40.0000 mg | ORAL_TABLET | Freq: Every day | ORAL | 3 refills | Status: AC

## 2024-01-08 MED ORDER — OLMESARTAN MEDOXOMIL 40 MG PO TABS: 40.0000 mg | ORAL_TABLET | Freq: Every day | ORAL | 3 refills | Status: DC

## 2024-01-08 NOTE — Telephone Encounter (Signed)
 Spoke with the pt and he has stated he had started 1 pill and a half the diastolic number is not getting out of the 100's but his top number has came down... Pt is concerned that there may be a blockage.

## 2024-01-08 NOTE — Telephone Encounter (Signed)
Addressed.  See other message

## 2024-01-08 NOTE — Telephone Encounter (Signed)
 Yes, please take olmesartan  40 mg daily.  I will change the prescription.  Thanks

## 2024-01-08 NOTE — Addendum Note (Signed)
 Addended by: Anber Mckiver V on: 01/08/2024 07:30 AM   Modules accepted: Orders

## 2024-01-08 NOTE — Progress Notes (Signed)
Olmesartan 

## 2024-01-09 NOTE — Telephone Encounter (Signed)
 Take olmesartan  40 mg a day.  Please see me if blood pressure is not controlled.  Thanks

## 2024-01-22 ENCOUNTER — Ambulatory Visit (INDEPENDENT_AMBULATORY_CARE_PROVIDER_SITE_OTHER): Admitting: Urology

## 2024-01-22 ENCOUNTER — Encounter: Payer: Self-pay | Admitting: Urology

## 2024-01-22 VITALS — BP 144/87 | HR 61

## 2024-01-22 DIAGNOSIS — C679 Malignant neoplasm of bladder, unspecified: Secondary | ICD-10-CM | POA: Diagnosis not present

## 2024-01-22 DIAGNOSIS — D494 Neoplasm of unspecified behavior of bladder: Secondary | ICD-10-CM

## 2024-01-22 LAB — URINALYSIS, ROUTINE W REFLEX MICROSCOPIC
Bilirubin, UA: NEGATIVE
Glucose, UA: NEGATIVE
Ketones, UA: NEGATIVE
Leukocytes,UA: NEGATIVE
Nitrite, UA: NEGATIVE
Protein,UA: NEGATIVE
RBC, UA: NEGATIVE
Specific Gravity, UA: 1.02 (ref 1.005–1.030)
Urobilinogen, Ur: 0.2 mg/dL (ref 0.2–1.0)
pH, UA: 6 (ref 5.0–7.5)

## 2024-01-22 MED ORDER — CIPROFLOXACIN HCL 500 MG PO TABS
500.0000 mg | ORAL_TABLET | Freq: Once | ORAL | Status: AC
  Administered 2024-01-22: 500 mg via ORAL

## 2024-01-22 NOTE — Progress Notes (Signed)
   01/22/24  CC: folloowup bladder cancer  HPI: [name] is a 63yo here for followup for bladder cancer Blood pressure (!) 144/87, pulse 61. NED. A&Ox3.   No respiratory distress   Abd soft, NT, ND Normal phallus with bilateral descended testicles  Cystoscopy Procedure Note  Patient identification was confirmed, informed consent was obtained, and patient was prepped using Betadine solution.  Lidocaine  jelly was administered per urethral meatus.     Pre-Procedure: - Inspection reveals a normal caliber ureteral meatus.  Procedure: The flexible cystoscope was introduced without difficulty - No urethral strictures/lesions are present. - Enlarged prostate  - Normal bladder neck - Bilateral ureteral orifices identified - Bladder mucosa  reveals no ulcers, tumors, or lesions - No bladder stones - No trabeculation    Post-Procedure: - Patient tolerated the procedure well  Assessment/ Plan: Followup 1 year for cystoscopy  No follow-ups on file.  Christian Clara, MD

## 2024-01-22 NOTE — Patient Instructions (Signed)

## 2024-01-29 ENCOUNTER — Other Ambulatory Visit: Payer: Self-pay | Admitting: Internal Medicine

## 2024-02-21 ENCOUNTER — Other Ambulatory Visit: Payer: Self-pay | Admitting: Internal Medicine

## 2024-02-22 NOTE — Telephone Encounter (Signed)
 Medication: Testosterone  Cypionate  Directions: ADMINISTER 1 ML IN THE MUSCLE EVERY 10 DAYS  Last given: 09/13/2023 Number refills: 0 Last o/v: 12/27/2023 Follow up:  Labs:   Please review refill request

## 2024-02-29 ENCOUNTER — Ambulatory Visit: Admitting: Internal Medicine

## 2024-02-29 ENCOUNTER — Other Ambulatory Visit: Payer: Self-pay | Admitting: Internal Medicine

## 2024-02-29 ENCOUNTER — Encounter: Payer: Self-pay | Admitting: Internal Medicine

## 2024-02-29 VITALS — BP 172/106 | HR 59 | Temp 98.3°F | Ht 68.0 in | Wt 211.6 lb

## 2024-02-29 DIAGNOSIS — R7989 Other specified abnormal findings of blood chemistry: Secondary | ICD-10-CM

## 2024-02-29 DIAGNOSIS — E559 Vitamin D deficiency, unspecified: Secondary | ICD-10-CM

## 2024-02-29 DIAGNOSIS — F1010 Alcohol abuse, uncomplicated: Secondary | ICD-10-CM

## 2024-02-29 DIAGNOSIS — I10 Essential (primary) hypertension: Secondary | ICD-10-CM | POA: Diagnosis not present

## 2024-02-29 DIAGNOSIS — N63 Unspecified lump in unspecified breast: Secondary | ICD-10-CM

## 2024-02-29 DIAGNOSIS — G6289 Other specified polyneuropathies: Secondary | ICD-10-CM

## 2024-02-29 DIAGNOSIS — R739 Hyperglycemia, unspecified: Secondary | ICD-10-CM | POA: Diagnosis not present

## 2024-02-29 DIAGNOSIS — E785 Hyperlipidemia, unspecified: Secondary | ICD-10-CM | POA: Diagnosis not present

## 2024-02-29 DIAGNOSIS — R202 Paresthesia of skin: Secondary | ICD-10-CM

## 2024-02-29 MED ORDER — CARVEDILOL 12.5 MG PO TABS: 12.5000 mg | ORAL_TABLET | Freq: Two times a day (BID) | ORAL | 3 refills | Status: DC

## 2024-02-29 NOTE — Assessment & Plan Note (Signed)
 Christian [name] has been drinking more: 1-2 large vodka and iced tea and 2-3 beers per night.  Check, liver function tests

## 2024-02-29 NOTE — Assessment & Plan Note (Signed)
 Worse.  Christian [name] has been drinking more: 1-2 large vodka and iced tea and 2-3 beers per night. Quit Mounjaro  3 months ago.  He quit Mounjaro  because it was making him sick to the stomach when we went to the highest dose.  He is seems to be drinking much more since he had quit using Mounjaro . It is possible that Mounjaro  was helping him to cut back on alcohol  I think most of today's problems are related to increased alcohol consumption.  Christian [name] will try to cut back.  We may restart Mounjaro  to help him drink less Neuropathy should improve with the decreased alcohol consumption

## 2024-02-29 NOTE — Assessment & Plan Note (Signed)
 Worse.  Christian [name] has been drinking more: 1-2 large vodka and iced tea and 2-3 beers per night. Quit Mounjaro  3 months ago.  He quit Mounjaro  because it was making him sick to the stomach when we went to the highest dose.  He is seems to be drinking much more since he had quit using Mounjaro . It is possible that Mounjaro  was helping him to cut back on alcohol  I think most of today's problems are related to increased alcohol consumption.  Christian [name] will try to cut back.  We may restart Mounjaro  to help him drink less Continue with olmesartan  40 mg daily.  Added carvedilol 12.5 mg twice daily

## 2024-02-29 NOTE — Progress Notes (Addendum)
 Subjective:  Patient ID: Christian [name], male    DOB: Nov 10, 1960  Age: 63 y.o. MRN: [id number]  CC: Hypertension (Symptomatic hypertension. Noted during last visit as well and medication adjusted. Patient has been taking medications as listed)   HPI Christian [name] presents for numbness in toes, high BP, LBP.  He has blood pressure has been high 170 range lately.  He has been drinking more. Quit Mounjaro  3 months ago.  No substantial weight gain following.  He quit Mounjaro  because it was making him sick to the stomach when we went to the highest dose. C/o R breast nodule for 2 to 3 months  Christian [name] is here with his wife Christian [name]  Outpatient Medications Prior to Visit  Medication Sig Dispense Refill   acetaminophen  (TYLENOL ) 500 MG tablet Take 1,000 mg by mouth every 8 (eight) hours as needed for moderate pain.     albuterol  (VENTOLIN  HFA) 108 (90 Base) MCG/ACT inhaler INHALE 2 PUFFS INTO THE LUNGS EVERY 6 HOURS AS NEEDED FOR WHEEZING OR SHORTNESS OF BREATH 6.7 g 3   aspirin  EC 81 MG tablet Take 81 mg by mouth daily. Swallow whole.     atorvastatin  (LIPITOR) 20 MG tablet Take 1 tablet (20 mg total) by mouth daily. 90 tablet 3   Carboxymethylcellulose Sodium (EYE DROPS OP) Place 1 drop into both eyes daily as needed (irritation).     clonazePAM  (KLONOPIN ) 1 MG tablet TAKE 1 TABLET BY MOUTH TWICE DAILY AS NEEDED FOR ANXIETY 60 tablet 1   HYDROcodone -acetaminophen  (NORCO) 7.5-325 MG tablet Take 0.5-1 tablets by mouth every 6 (six) hours as needed for moderate pain (pain score 4-6) or severe pain (pain score 7-10). 60 tablet 0   indomethacin  (INDOCIN ) 50 MG capsule TAKE 1 CAPSULE BY MOUTH THREE TIMES DAILY AS NEEDED FOR MODERATE PAIN 30 capsule 1   olmesartan  (BENICAR ) 40 MG tablet Take 1 tablet (40 mg total) by mouth daily. 90 tablet 3   SYRINGE-NEEDLE, DISP, 3 ML (B-D 3CC LUER-LOK SYR 23GX1-1/2) 23G X 1-1/2 3 ML MISC Use IM every 2 weeks 50 each 3   testosterone  cypionate (DEPOTESTOSTERONE  CYPIONATE) 200 MG/ML injection ADMINISTER 1 ML IN THE MUSCLE EVERY 10 DAYS 10 mL 1   tirzepatide  (MOUNJARO ) 12.5 MG/0.5ML Pen Inject 12.5 mg into the skin once a week. 6 mL 2   meloxicam (MOBIC) 7.5 MG tablet TAKE 1 TABLET BY MOUTH TWICE DAILY FOR 21 DAYS     No facility-administered medications prior to visit.    ROS: Review of Systems  Constitutional:  Negative for appetite change, fatigue and unexpected weight change.  HENT:  Negative for congestion, nosebleeds, sneezing, sore throat and trouble swallowing.   Eyes:  Negative for itching and visual disturbance.  Respiratory:  Negative for cough.   Cardiovascular:  Negative for chest pain, palpitations and leg swelling.  Gastrointestinal:  Negative for abdominal distention, blood in stool, diarrhea and nausea.  Genitourinary:  Negative for frequency and hematuria.  Musculoskeletal:  Negative for back pain, gait problem, joint swelling and neck pain.  Skin:  Negative for rash.  Neurological:  Negative for dizziness, tremors, speech difficulty and weakness.  Psychiatric/Behavioral:  Negative for agitation, dysphoric mood and sleep disturbance. The patient is not nervous/anxious.     Objective:  BP (!) 172/106   Pulse (!) 59   Temp 98.3 F (36.8 C)   Ht 5' 8 (1.727 m)   Wt 211 lb 9.6 oz (96 kg)   SpO2 99%   BMI  32.17 kg/m   BP Readings from Last 3 Encounters:  02/29/24 (!) 172/106  01/22/24 (!) 144/87  12/27/23 (!) 150/90    Wt Readings from Last 3 Encounters:  02/29/24 211 lb 9.6 oz (96 kg)  12/27/23 209 lb (94.8 kg)  09/13/23 209 lb (94.8 kg)    Physical Exam Constitutional:      General: He is not in acute distress.    Appearance: He is well-developed.     Comments: NAD  Eyes:     Conjunctiva/sclera: Conjunctivae normal.     Pupils: Pupils are equal, round, and reactive to light.  Neck:     Thyroid : No thyromegaly.     Vascular: No JVD.  Cardiovascular:     Rate and Rhythm: Normal rate and regular rhythm.      Heart sounds: Normal heart sounds. No murmur heard.    No friction rub. No gallop.  Pulmonary:     Effort: Pulmonary effort is normal. No respiratory distress.     Breath sounds: Normal breath sounds. No wheezing or rales.  Chest:     Chest wall: No tenderness.  Abdominal:     General: Bowel sounds are normal. There is no distension.     Palpations: Abdomen is soft. There is no mass.     Tenderness: There is no abdominal tenderness. There is no guarding or rebound.  Musculoskeletal:        General: No tenderness. Normal range of motion.     Cervical back: Normal range of motion.  Lymphadenopathy:     Cervical: No cervical adenopathy.  Skin:    General: Skin is warm and dry.     Findings: No rash.  Neurological:     Mental Status: He is alert and oriented to person, place, and time.     Cranial Nerves: No cranial nerve deficit.     Motor: No abnormal muscle tone.     Coordination: Coordination normal.     Gait: Gait normal.     Deep Tendon Reflexes: Reflexes are normal and symmetric.  Psychiatric:        Behavior: Behavior normal.        Thought Content: Thought content normal.        Judgment: Judgment normal.   6 mm nodule on the left nipple at 3:00 slightly sensitive Both feet with good pulses    A total time of 45 minutes was spent preparing to see the patient, reviewing tests, x-rays, previous reports and other medical records.  Also, obtaining history and performing comprehensive physical exam.  Additionally, counseling the patient regarding the above listed issues -increased alcohol consumption probably causing gynecomastia, neuropathy, uncontrolled blood pressure.   Finally, documenting clinical information in the health records, coordination of care, educating the patient.  Lab Results  Component Value Date   WBC 6.1 12/25/2023   HGB 15.4 12/25/2023   HCT 45.4 12/25/2023   PLT 176.0 12/25/2023   GLUCOSE 67 (L) 12/25/2023   CHOL 162 05/01/2023   TRIG 76.0  05/01/2023   HDL 49.30 05/01/2023   LDLDIRECT 191.0 03/22/2021   LDLCALC 97 05/01/2023   ALT 13 12/25/2023   AST 16 12/25/2023   NA 142 12/25/2023   K 4.0 12/25/2023   CL 106 12/25/2023   CREATININE 0.77 12/25/2023   BUN 11 12/25/2023   CO2 28 12/25/2023   TSH 1.40 04/05/2022   PSA 0.93 05/01/2023   INR 1.01 03/28/2012   HGBA1C 5.2 05/01/2023    CT KNEE LEFT WO  CONTRAST Result Date: 09/14/2023 CLINICAL DATA:  Left knee injury 09/06/2023 EXAM: CT OF THE LEFT KNEE WITHOUT CONTRAST TECHNIQUE: Multidetector CT imaging of the left knee was performed according to the standard protocol. Multiplanar CT image reconstructions were also generated. RADIATION DOSE REDUCTION: This exam was performed according to the departmental dose-optimization program which includes automated exposure control, adjustment of the mA and/or kV according to patient size and/or use of iterative reconstruction technique. COMPARISON:  None Available. FINDINGS: Age advanced medial and lateral compartment degenerative changes with joint space narrowing osteophytic spurring subchondral cystic change. Large subchondral geodes noted near the tibial spines. There is a nondisplaced nondepressed intra-articular fracture the lateral tibial plateau. No fracture of the femur, fibula or patella. Moderate to large joint effusion/lipohemarthrosis. Scattered chondrocalcinosis is noted. There are also small ossified loose bodies in the joint. Grossly by CT the PCL is intact. The ACL is not demonstrated. The medial and lateral collateral ligaments are intact. Areas of calcification involving the MCL suggesting prior injury. The quadriceps and patellar tendons are intact. Scattered vascular calcifications. IMPRESSION: 1. Nondisplaced, nondepressed intra-articular fracture of the lateral tibial plateau. 2. Moderate to large joint effusion/lipohemarthrosis. 3. Age advanced medial and lateral compartment degenerative changes. 4. The ACL is well  demonstrated. 5. Areas of calcification involving the MCL suggesting prior injury. Electronically Signed   By: MYRTIS Stammer M.D.   On: 09/14/2023 14:40    Assessment & Plan:   Problem List Items Addressed This Visit     Alcohol abuse (Chronic)   Christian [name] has been drinking more: 1-2 large vodka and iced tea and 2-3 beers per night. Quit Mounjaro  3 months ago.  He quit Mounjaro  because it was making him sick to the stomach when we went to the highest dose.  He is seems to be drinking much more since he had quit using Mounjaro . It is possible that Mounjaro  was helping him to cut back on alcohol  I think most of today's problems are related to increased alcohol consumption.  Christian [name] will try to cut back.  We may restart Mounjaro  to help him drink less      Breast nodule   6 mm nodule on the left nipple at 3:00 slightly sensitive.  Cut back on alcohol.  Diagnostic mammogram was ordered      Relevant Orders   MM Digital Diagnostic Unilat R   Dyslipidemia - Primary   Relevant Orders   CBC with Differential/Platelet   Comprehensive metabolic panel with GFR   Testosterone    Vitamin B12   VITAMIN D  25 Hydroxy (Vit-D Deficiency, Fractures)   PSA   TSH   Hemoglobin A1c   Comprehensive metabolic panel with GFR   Elevated LFTs   Christian [name] has been drinking more: 1-2 large vodka and iced tea and 2-3 beers per night.  Check, liver function tests      HTN (hypertension)   Worse.  Christian [name] has been drinking more: 1-2 large vodka and iced tea and 2-3 beers per night. Quit Mounjaro  3 months ago.  He quit Mounjaro  because it was making him sick to the stomach when we went to the highest dose.  He is seems to be drinking much more since he had quit using Mounjaro . It is possible that Mounjaro  was helping him to cut back on alcohol  I think most of today's problems are related to increased alcohol consumption.  Christian [name] will try to cut back.  We may restart Mounjaro  to help him drink less Continue with olmesartan  40  mg daily.  Added carvedilol 12.5 mg twice daily      Relevant Medications   carvedilol (COREG) 12.5 MG tablet   Hyperglycemia   Relevant Orders   CBC with Differential/Platelet   Comprehensive metabolic panel with GFR   Testosterone    Vitamin B12   VITAMIN D  25 Hydroxy (Vit-D Deficiency, Fractures)   PSA   TSH   Hemoglobin A1c   Comprehensive metabolic panel with GFR   Peripheral neuropathy   Worse.  Christian [name] has been drinking more: 1-2 large vodka and iced tea and 2-3 beers per night. Quit Mounjaro  3 months ago.  He quit Mounjaro  because it was making him sick to the stomach when we went to the highest dose.  He is seems to be drinking much more since he had quit using Mounjaro . It is possible that Mounjaro  was helping him to cut back on alcohol  I think most of today's problems are related to increased alcohol consumption.  Christian [name] will try to cut back.  We may restart Mounjaro  to help him drink less Neuropathy should improve with the decreased alcohol consumption      Vitamin D  deficiency   Relevant Orders   VITAMIN D  25 Hydroxy (Vit-D Deficiency, Fractures)   Other Visit Diagnoses       Paresthesia       Relevant Orders   Vitamin B12   TSH         Meds ordered this encounter  Medications   carvedilol (COREG) 12.5 MG tablet    Sig: Take 1 tablet (12.5 mg total) by mouth 2 (two) times daily with a meal.    Dispense:  60 tablet    Refill:  3      Follow-up: Return in about 4 months (around 07/01/2024) for a follow-up visit.  Marolyn Noel, MD

## 2024-02-29 NOTE — Addendum Note (Signed)
 Addended by: Mart Colpitts V on: 02/29/2024 12:11 PM   Modules accepted: Orders

## 2024-02-29 NOTE — Addendum Note (Signed)
 Addended by: Daanya Lanphier V on: 02/29/2024 01:35 PM   Modules accepted: Level of Service

## 2024-02-29 NOTE — Assessment & Plan Note (Signed)
 6 mm nodule on the left nipple at 3:00 slightly sensitive.  Cut back on alcohol.  Diagnostic mammogram was ordered

## 2024-02-29 NOTE — Assessment & Plan Note (Signed)
 Christian [name] has been drinking more: 1-2 large vodka and iced tea and 2-3 beers per night. Quit Mounjaro  3 months ago.  He quit Mounjaro  because it was making him sick to the stomach when we went to the highest dose.  He is seems to be drinking much more since he had quit using Mounjaro . It is possible that Mounjaro  was helping him to cut back on alcohol  I think most of today's problems are related to increased alcohol consumption.  Christian [name] will try to cut back.  We may restart Mounjaro  to help him drink less

## 2024-03-06 ENCOUNTER — Other Ambulatory Visit

## 2024-03-06 DIAGNOSIS — E559 Vitamin D deficiency, unspecified: Secondary | ICD-10-CM

## 2024-03-06 DIAGNOSIS — R202 Paresthesia of skin: Secondary | ICD-10-CM

## 2024-03-06 DIAGNOSIS — R739 Hyperglycemia, unspecified: Secondary | ICD-10-CM | POA: Diagnosis not present

## 2024-03-06 DIAGNOSIS — E785 Hyperlipidemia, unspecified: Secondary | ICD-10-CM

## 2024-03-06 LAB — COMPREHENSIVE METABOLIC PANEL WITH GFR
ALT: 20 U/L (ref 0–53)
AST: 20 U/L (ref 0–37)
Albumin: 4.7 g/dL (ref 3.5–5.2)
Alkaline Phosphatase: 52 U/L (ref 39–117)
BUN: 13 mg/dL (ref 6–23)
CO2: 31 meq/L (ref 19–32)
Calcium: 9.3 mg/dL (ref 8.4–10.5)
Chloride: 101 meq/L (ref 96–112)
Creatinine, Ser: 0.91 mg/dL (ref 0.40–1.50)
GFR: 91.08 mL/min (ref >60.00)
Glucose, Bld: 90 mg/dL (ref 70–99)
Potassium: 4.6 meq/L (ref 3.5–5.1)
Sodium: 140 meq/L (ref 135–145)
Total Bilirubin: 1 mg/dL (ref 0.2–1.2)
Total Protein: 6.9 g/dL (ref 6.0–8.3)

## 2024-03-06 LAB — CBC WITH DIFFERENTIAL/PLATELET
Basophils Absolute: 0.1 K/uL (ref 0.0–0.1)
Basophils Relative: 0.8 % (ref 0.0–3.0)
Eosinophils Absolute: 0.5 K/uL (ref 0.0–0.7)
Eosinophils Relative: 7.1 % — ABNORMAL HIGH (ref 0.0–5.0)
HCT: 48.8 % (ref 39.0–52.0)
Hemoglobin: 16.4 g/dL (ref 13.0–17.0)
Lymphocytes Relative: 20.6 % (ref 12.0–46.0)
Lymphs Abs: 1.5 K/uL (ref 0.7–4.0)
MCHC: 33.6 g/dL (ref 30.0–36.0)
MCV: 97.6 fl (ref 78.0–100.0)
Monocytes Absolute: 0.5 K/uL (ref 0.1–1.0)
Monocytes Relative: 7.4 % (ref 3.0–12.0)
Neutro Abs: 4.7 K/uL (ref 1.4–7.7)
Neutrophils Relative %: 64.1 % (ref 43.0–77.0)
Platelets: 174 K/uL (ref 150.0–400.0)
RBC: 5 Mil/uL (ref 4.22–5.81)
RDW: 14.1 % (ref 11.5–15.5)
WBC: 7.3 K/uL (ref 4.0–10.5)

## 2024-03-06 LAB — TESTOSTERONE: Testosterone: 216.12 ng/dL — ABNORMAL LOW (ref 300.00–890.00)

## 2024-03-06 LAB — HEMOGLOBIN A1C: Hgb A1c MFr Bld: 5.3 % (ref 4.6–6.5)

## 2024-03-06 LAB — TSH: TSH: 2.33 u[IU]/mL (ref 0.35–5.50)

## 2024-03-06 LAB — VITAMIN D 25 HYDROXY (VIT D DEFICIENCY, FRACTURES): VITD: 20.28 ng/mL — ABNORMAL LOW (ref 30.00–100.00)

## 2024-03-06 LAB — VITAMIN B12: Vitamin B-12: 543 pg/mL (ref 211–911)

## 2024-03-06 LAB — PSA: PSA: 1.11 ng/mL (ref 0.10–4.00)

## 2024-03-10 ENCOUNTER — Ambulatory Visit: Payer: Self-pay | Admitting: Internal Medicine

## 2024-03-14 ENCOUNTER — Ambulatory Visit
Admission: RE | Admit: 2024-03-14 | Discharge: 2024-03-14 | Disposition: A | Source: Ambulatory Visit | Attending: Internal Medicine | Admitting: Internal Medicine

## 2024-03-14 ENCOUNTER — Ambulatory Visit
Admission: RE | Admit: 2024-03-14 | Discharge: 2024-03-14 | Disposition: A | Discharge status: Home / Self Care | Source: Ambulatory Visit | Attending: Internal Medicine | Admitting: Internal Medicine

## 2024-03-14 DIAGNOSIS — N63 Unspecified lump in unspecified breast: Secondary | ICD-10-CM

## 2024-03-18 ENCOUNTER — Ambulatory Visit: Payer: Self-pay | Admitting: Internal Medicine

## 2024-04-08 ENCOUNTER — Other Ambulatory Visit: Payer: Self-pay | Admitting: Internal Medicine

## 2024-04-16 ENCOUNTER — Encounter: Payer: Self-pay | Admitting: Internal Medicine

## 2024-04-16 ENCOUNTER — Ambulatory Visit: Admitting: Internal Medicine

## 2024-04-16 VITALS — BP 170/108 | HR 77 | Temp 98.3°F | Ht 68.0 in | Wt 217.0 lb

## 2024-04-16 DIAGNOSIS — I2510 Atherosclerotic heart disease of native coronary artery without angina pectoris: Secondary | ICD-10-CM

## 2024-04-16 DIAGNOSIS — G4733 Obstructive sleep apnea (adult) (pediatric): Secondary | ICD-10-CM

## 2024-04-16 DIAGNOSIS — E291 Testicular hypofunction: Secondary | ICD-10-CM

## 2024-04-16 DIAGNOSIS — I10 Essential (primary) hypertension: Secondary | ICD-10-CM | POA: Diagnosis not present

## 2024-04-16 DIAGNOSIS — F419 Anxiety disorder, unspecified: Secondary | ICD-10-CM | POA: Diagnosis not present

## 2024-04-16 DIAGNOSIS — E559 Vitamin D deficiency, unspecified: Secondary | ICD-10-CM

## 2024-04-16 DIAGNOSIS — G4730 Sleep apnea, unspecified: Secondary | ICD-10-CM | POA: Insufficient documentation

## 2024-04-16 DIAGNOSIS — I2583 Coronary atherosclerosis due to lipid rich plaque: Secondary | ICD-10-CM

## 2024-04-16 MED ORDER — CARVEDILOL 25 MG PO TABS: 25.0000 mg | ORAL_TABLET | Freq: Two times a day (BID) | ORAL | 3 refills | Status: AC

## 2024-04-16 MED ORDER — HYDROCHLOROTHIAZIDE 12.5 MG PO CAPS: 12.5000 mg | ORAL_CAPSULE | Freq: Every day | ORAL | 5 refills | Status: AC

## 2024-04-16 MED ORDER — AMLODIPINE BESYLATE 5 MG PO TABS: 5.0000 mg | ORAL_TABLET | Freq: Every day | ORAL | 5 refills | Status: AC

## 2024-04-16 NOTE — Assessment & Plan Note (Addendum)
  Continue with olmesartan  40 mg daily.  Increase carvedilol  to 25 mg twice daily. Add hydrochlorothiazide 12.5 mg/d and Norvasc  5 mg/d Cardiology ref

## 2024-04-16 NOTE — Assessment & Plan Note (Signed)
 Cont on Lipitor, ASA 81 Card ref BP control

## 2024-04-16 NOTE — Assessment & Plan Note (Signed)
Klonopin - occasional use prn - not w/alcohol  Potential benefits of a long term benzodiazepines  use as well as potential risks  and complications were explained to the patient and were aknowledged.  

## 2024-04-16 NOTE — Assessment & Plan Note (Signed)
 On Vit D

## 2024-04-16 NOTE — Assessment & Plan Note (Signed)
 Intolerant of CPAP Try a chin strap

## 2024-04-16 NOTE — Patient Instructions (Signed)
 Try a chin strap Contour pillow

## 2024-04-16 NOTE — Progress Notes (Addendum)
 Subjective:  Patient ID: Christian [name], male    DOB: 1960/06/07  Age: 64 y.o. MRN: [id number]  CC: Medical Management of Chronic Issues (3 Month follow up)   HPI Christian [name] presents for high BP, h/o CAD H/o OSA  Outpatient Medications Prior to Visit  Medication Sig Dispense Refill   acetaminophen  (TYLENOL ) 500 MG tablet Take 1,000 mg by mouth every 8 (eight) hours as needed for moderate pain.     albuterol  (VENTOLIN  HFA) 108 (90 Base) MCG/ACT inhaler INHALE 2 PUFFS INTO THE LUNGS EVERY 6 HOURS AS NEEDED FOR WHEEZING OR SHORTNESS OF BREATH 6.7 g 3   aspirin  EC 81 MG tablet Take 81 mg by mouth daily. Swallow whole.     atorvastatin  (LIPITOR) 20 MG tablet Take 1 tablet (20 mg total) by mouth daily. 90 tablet 3   Carboxymethylcellulose Sodium (EYE DROPS OP) Place 1 drop into both eyes daily as needed (irritation).     clonazePAM  (KLONOPIN ) 1 MG tablet TAKE 1 TABLET BY MOUTH TWICE DAILY AS NEEDED FOR ANXIETY 60 tablet 1   HYDROcodone -acetaminophen  (NORCO) 7.5-325 MG tablet Take 0.5-1 tablets by mouth every 6 (six) hours as needed for moderate pain (pain score 4-6) or severe pain (pain score 7-10). 60 tablet 0   indomethacin  (INDOCIN ) 50 MG capsule TAKE 1 CAPSULE BY MOUTH THREE TIMES DAILY AS NEEDED FOR MODERATE PAIN 30 capsule 1   olmesartan  (BENICAR ) 40 MG tablet Take 1 tablet (40 mg total) by mouth daily. 90 tablet 3   SYRINGE-NEEDLE, DISP, 3 ML (B-D 3CC LUER-LOK SYR 23GX1-1/2) 23G X 1-1/2 3 ML MISC Use IM every 2 weeks 50 each 3   testosterone  cypionate (DEPOTESTOSTERONE CYPIONATE) 200 MG/ML injection ADMINISTER 1 ML IN THE MUSCLE EVERY 10 DAYS 10 mL 1   tirzepatide  (MOUNJARO ) 12.5 MG/0.5ML Pen Inject 12.5 mg into the skin once a week. 6 mL 2   carvedilol  (COREG ) 12.5 MG tablet Take 1 tablet (12.5 mg total) by mouth 2 (two) times daily with a meal. 60 tablet 3   No facility-administered medications prior to visit.    ROS: Review of Systems  Constitutional:  Negative for  appetite change, fatigue and unexpected weight change.  HENT:  Negative for congestion, nosebleeds, sneezing, sore throat and trouble swallowing.   Eyes:  Negative for itching and visual disturbance.  Respiratory:  Negative for cough, chest tightness and shortness of breath.   Cardiovascular:  Negative for chest pain, palpitations and leg swelling.  Gastrointestinal:  Negative for abdominal distention, blood in stool, diarrhea and nausea.  Genitourinary:  Negative for frequency and hematuria.  Musculoskeletal:  Positive for arthralgias. Negative for back pain, gait problem, joint swelling and neck pain.  Skin:  Negative for rash.  Neurological:  Negative for dizziness, tremors, speech difficulty and weakness.  Psychiatric/Behavioral:  Positive for sleep disturbance. Negative for agitation, decreased concentration, dysphoric mood and suicidal ideas. The patient is not nervous/anxious.     Objective:  BP (!) 170/108   Pulse 77   Temp 98.3 F (36.8 C)   Ht 5' 8 (1.727 m)   Wt 217 lb (98.4 kg)   SpO2 97%   BMI 32.99 kg/m   BP Readings from Last 3 Encounters:  04/16/24 (!) 170/108  02/29/24 (!) 172/106  01/22/24 (!) 144/87    Wt Readings from Last 3 Encounters:  04/16/24 217 lb (98.4 kg)  02/29/24 211 lb 9.6 oz (96 kg)  12/27/23 209 lb (94.8 kg)    Physical Exam Constitutional:  General: He is not in acute distress.    Appearance: Normal appearance. He is well-developed.     Comments: NAD  Eyes:     Conjunctiva/sclera: Conjunctivae normal.     Pupils: Pupils are equal, round, and reactive to light.  Neck:     Thyroid : No thyromegaly.     Vascular: No JVD.  Cardiovascular:     Rate and Rhythm: Normal rate and regular rhythm.     Heart sounds: Normal heart sounds. No murmur heard.    No friction rub. No gallop.  Pulmonary:     Effort: Pulmonary effort is normal. No respiratory distress.     Breath sounds: Normal breath sounds. No wheezing or rales.  Chest:      Chest wall: No tenderness.  Abdominal:     General: Bowel sounds are normal. There is no distension.     Palpations: Abdomen is soft. There is no mass.     Tenderness: There is no abdominal tenderness. There is no guarding or rebound.  Musculoskeletal:        General: No tenderness. Normal range of motion.     Cervical back: Normal range of motion.  Lymphadenopathy:     Cervical: No cervical adenopathy.  Skin:    General: Skin is warm and dry.     Findings: No rash.  Neurological:     Mental Status: He is alert and oriented to person, place, and time.     Cranial Nerves: No cranial nerve deficit.     Motor: No abnormal muscle tone.     Coordination: Coordination normal.     Gait: Gait normal.     Deep Tendon Reflexes: Reflexes are normal and symmetric.  Psychiatric:        Behavior: Behavior normal.        Thought Content: Thought content normal.        Judgment: Judgment normal.   obese  Lab Results  Component Value Date   WBC 7.3 03/06/2024   HGB 16.4 03/06/2024   HCT 48.8 03/06/2024   PLT 174.0 03/06/2024   GLUCOSE 90 03/06/2024   CHOL 162 05/01/2023   TRIG 76.0 05/01/2023   HDL 49.30 05/01/2023   LDLDIRECT 191.0 03/22/2021   LDLCALC 97 05/01/2023   ALT 20 03/06/2024   AST 20 03/06/2024   NA 140 03/06/2024   K 4.6 03/06/2024   CL 101 03/06/2024   CREATININE 0.91 03/06/2024   BUN 13 03/06/2024   CO2 31 03/06/2024   TSH 2.33 03/06/2024   PSA 1.11 03/06/2024   INR 1.01 03/28/2012   HGBA1C 5.3 03/06/2024    MM 3D DIAGNOSTIC MAMMOGRAM BILATERAL BREAST Result Date: 03/14/2024 CLINICAL DATA:  63 year old male with a possible palpable lump in the upper outer quadrant of the RIGHT breast on recent clinical examination, though the patient does not palpate a lump. This is the patient's initial mammogram. EXAM: DIGITAL DIAGNOSTIC BILATERAL MAMMOGRAM WITH TOMOSYNTHESIS AND CAD; ULTRASOUND RIGHT BREAST LIMITED TECHNIQUE: Bilateral digital diagnostic mammography and breast  tomosynthesis was performed. The images were evaluated with computer-aided detection. ; Targeted ultrasound examination of the right breast was performed COMPARISON:  None. ACR Breast Density Category a: The breasts are almost entirely fatty. FINDINGS: Full field CC and MLO views of both breasts were obtained. RIGHT: No findings suspicious for malignancy. No mammographic abnormality in. High density material within low axillary lymph nodes (consistent with the fact that the patient has tattoos on the upper extremity). Targeted ultrasound is performed in the UPPER  OUTER QUADRANT, demonstrating a benign lipoma at 10 o'clock 4 cm from nipple measuring approximately 1.2 cm. No suspicious solid mass or abnormal acoustic shadowing is identified. On correlative physical examination, there is a palpable soft 1-2 cm lump in the UPPER OUTER QUADRANT corresponding to the sonographic finding. LEFT: No findings suspicious for malignancy.  Minimal gynecomastia. IMPRESSION: 1. No mammographic or sonographic evidence of malignancy involving the RIGHT breast. 2. No mammographic evidence of malignancy involving the LEFT breast. 3. Benign 1.2 cm lipoma in the UPPER OUTER QUADRANT of the RIGHT breast which likely accounts for the palpable concern. 4. Minimal symmetric BILATERAL gynecomastia. RECOMMENDATION: No further imaging follow-up is necessary unless there are persistent or subsequent clinical concerns. I have discussed the findings and recommendations with the patient. I discussed with the patient the fact that gynecomastia can occur in men as testosterone  levels decrease with age or in younger men with low testosterone  levels, causing a change in the serum testosterone :estrogen ratio. We also discussed other potential etiologies of gynecomastia including numerous prescription medications and recreational drugs (marijuana and anabolic steroids in particular). I note that the patient is atorvastatin  and carvedilol  which can rarely  cause gynecomastia. Approximately 20% of cases of gynecomastia are idiopathic. I counseled the patient to perform self-examination to make sure that a hard lump does not develop which could indicate malignancy and would need further evaluation. BI-RADS CATEGORY  2: Benign. Electronically Signed   By: Debby Satterfield M.D.   On: 03/14/2024 10:33   US  LIMITED ULTRASOUND INCLUDING AXILLA RIGHT BREAST Result Date: 03/14/2024 CLINICAL DATA:  63 year old male with a possible palpable lump in the upper outer quadrant of the RIGHT breast on recent clinical examination, though the patient does not palpate a lump. This is the patient's initial mammogram. EXAM: DIGITAL DIAGNOSTIC BILATERAL MAMMOGRAM WITH TOMOSYNTHESIS AND CAD; ULTRASOUND RIGHT BREAST LIMITED TECHNIQUE: Bilateral digital diagnostic mammography and breast tomosynthesis was performed. The images were evaluated with computer-aided detection. ; Targeted ultrasound examination of the right breast was performed COMPARISON:  None. ACR Breast Density Category a: The breasts are almost entirely fatty. FINDINGS: Full field CC and MLO views of both breasts were obtained. RIGHT: No findings suspicious for malignancy. No mammographic abnormality in. High density material within low axillary lymph nodes (consistent with the fact that the patient has tattoos on the upper extremity). Targeted ultrasound is performed in the UPPER OUTER QUADRANT, demonstrating a benign lipoma at 10 o'clock 4 cm from nipple measuring approximately 1.2 cm. No suspicious solid mass or abnormal acoustic shadowing is identified. On correlative physical examination, there is a palpable soft 1-2 cm lump in the UPPER OUTER QUADRANT corresponding to the sonographic finding. LEFT: No findings suspicious for malignancy.  Minimal gynecomastia. IMPRESSION: 1. No mammographic or sonographic evidence of malignancy involving the RIGHT breast. 2. No mammographic evidence of malignancy involving the LEFT  breast. 3. Benign 1.2 cm lipoma in the UPPER OUTER QUADRANT of the RIGHT breast which likely accounts for the palpable concern. 4. Minimal symmetric BILATERAL gynecomastia. RECOMMENDATION: No further imaging follow-up is necessary unless there are persistent or subsequent clinical concerns. I have discussed the findings and recommendations with the patient. I discussed with the patient the fact that gynecomastia can occur in men as testosterone  levels decrease with age or in younger men with low testosterone  levels, causing a change in the serum testosterone :estrogen ratio. We also discussed other potential etiologies of gynecomastia including numerous prescription medications and recreational drugs (marijuana and anabolic steroids in particular). I note  that the patient is atorvastatin  and carvedilol  which can rarely cause gynecomastia. Approximately 20% of cases of gynecomastia are idiopathic. I counseled the patient to perform self-examination to make sure that a hard lump does not develop which could indicate malignancy and would need further evaluation. BI-RADS CATEGORY  2: Benign. Electronically Signed   By: Debby Satterfield M.D.   On: 03/14/2024 10:33    Assessment & Plan:   Problem List Items Addressed This Visit     Anxiety   Klonopin  - occasional use prn - not w/alcohol  Potential benefits of a long term benzodiazepines  use as well as potential risks  and complications were explained to the patient and were aknowledged.       Coronary artery disease   Cont on Lipitor, ASA 81 Card ref BP control      Relevant Medications   carvedilol  (COREG ) 25 MG tablet   amLODipine  (NORVASC ) 5 MG tablet   hydrochlorothiazide (MICROZIDE) 12.5 MG capsule   Other Relevant Orders   Ambulatory referral to Cardiology   HTN (hypertension) - Primary    Continue with olmesartan  40 mg daily.  Increase carvedilol  to 25 mg twice daily. Add hydrochlorothiazide 12.5 mg/d and Norvasc  5 mg/d Cardiology ref       Relevant Medications   carvedilol  (COREG ) 25 MG tablet   amLODipine  (NORVASC ) 5 MG tablet   hydrochlorothiazide (MICROZIDE) 12.5 MG capsule   Other Relevant Orders   Ambulatory referral to Cardiology   Hypogonadism male   On  Testosterone   Potential benefits of a long term sex steroid  use as well as potential risks  and complications were explained to the patient and were aknowledged - OSA, MI etc.      Sleep apnea   Intolerant of CPAP Try a chin strap      Vitamin D  deficiency   On Vit D         Meds ordered this encounter  Medications   carvedilol  (COREG ) 25 MG tablet    Sig: Take 1 tablet (25 mg total) by mouth 2 (two) times daily with a meal.    Dispense:  60 tablet    Refill:  3   amLODipine  (NORVASC ) 5 MG tablet    Sig: Take 1 tablet (5 mg total) by mouth daily.    Dispense:  30 tablet    Refill:  5   hydrochlorothiazide (MICROZIDE) 12.5 MG capsule    Sig: Take 1 capsule (12.5 mg total) by mouth daily.    Dispense:  30 capsule    Refill:  5      Follow-up: Return in about 4 weeks (around 05/14/2024) for a follow-up visit.  Marolyn Noel, MD

## 2024-04-16 NOTE — Assessment & Plan Note (Signed)
 On  Testosterone   Potential benefits of a long term sex steroid  use as well as potential risks  and complications were explained to the patient and were aknowledged - OSA, MI etc.

## 2024-04-17 ENCOUNTER — Telehealth: Payer: Self-pay

## 2024-04-17 NOTE — Telephone Encounter (Signed)
 Copied from CRM [id number]. Topic: Clinical - Medication Question >> Apr 17, 2024  3:05 PM Robinson H wrote: Reason for CRM: Patients wife wants to verify if the patient should still be taking the atorvastatin  (LIPITOR) 20 MG tablet, states the medication wasn't highlighted on his after visit summary.  Estefana [phone]

## 2024-04-23 NOTE — Telephone Encounter (Signed)
 Yes, atorvastatin  is on his med list-please continue. Thanks,

## 2024-04-24 NOTE — Telephone Encounter (Signed)
 Yes, atorvastatin  is on his med list-please continue. Thanks,

## 2024-05-01 ENCOUNTER — Other Ambulatory Visit: Payer: Self-pay | Admitting: Internal Medicine

## 2024-05-06 ENCOUNTER — Other Ambulatory Visit: Payer: Self-pay | Admitting: Internal Medicine

## 2024-05-29 ENCOUNTER — Encounter: Payer: Self-pay | Admitting: Internal Medicine

## 2024-05-29 ENCOUNTER — Ambulatory Visit: Payer: Self-pay | Admitting: Internal Medicine

## 2024-05-29 VITALS — BP 142/84 | HR 78 | Ht 68.0 in | Wt 219.8 lb

## 2024-05-29 DIAGNOSIS — Z23 Encounter for immunization: Secondary | ICD-10-CM | POA: Diagnosis not present

## 2024-05-29 DIAGNOSIS — I10 Essential (primary) hypertension: Secondary | ICD-10-CM

## 2024-05-29 DIAGNOSIS — D494 Neoplasm of unspecified behavior of bladder: Secondary | ICD-10-CM | POA: Diagnosis not present

## 2024-05-29 DIAGNOSIS — G4733 Obstructive sleep apnea (adult) (pediatric): Secondary | ICD-10-CM

## 2024-05-29 DIAGNOSIS — E785 Hyperlipidemia, unspecified: Secondary | ICD-10-CM | POA: Diagnosis not present

## 2024-05-29 NOTE — Assessment & Plan Note (Signed)
 F/u Dr Sherrilee q 12 mo

## 2024-05-29 NOTE — Patient Instructions (Addendum)
"   Inspire procedure  Recent large-scale observational studies provide strong evidence that the shingles vaccine is associated with a reduced risk of developing dementia. It may also help slow cognitive decline in individuals who already have dementia.  Key Findings from Recent Research Multiple natural experiment studies, which leveraged unique vaccination policies to minimize bias, have consistently found a protective link:  Reduced Risk: One large study, published in [place], analyzed health records from over 280,000 older adults in [place] and found that those who received the live-attenuated shingles vaccine (Zostavax, now discontinued in the [place] ) were approximately 20% less likely to develop dementia over a seven-year follow-up period. Slower Progression: A follow-up study published in Cell indicated that for those already living with dementia, the vaccine appeared to slow the progression of the disease and reduced dementia-related deaths by nearly half over nine years. Vascular Dementia Protection: Other research presented at [facility] 2025 indicated that the vaccine lowered the risk of vascular dementia by 50%. Newer Vaccine: A separate study published in [employer] [facility] suggested that the newer, more effective recombinant vaccine (Shingrix , currently used in the [place] ) also offers significant protection against dementia, with an association of lower risk than the older vaccine type. Stronger Effect in Women: Several studies noted that the protective effect against dementia was more pronounced in women than in men, possibly due to differences in immune responses or dementia pathogenesis.  Why Might the Vaccine Help? Researchers are still working to determine the exact mechanism, but current theories center on the idea that preventing the shingles virus (varicella-zoster) from reactivating helps protect the brain:  Reduced Inflammation: The most likely explanation is that the vaccine prevents shingles  infections and subsequent inflammation in the nervous system, which is a known risk factor for neurodegenerative diseases like dementia. Immune System Modulation: It's also possible that the vaccine boosts the immune system more broadly, helping the body combat other processes related to cognitive decline.  Important Considerations Observational Data: While the evidence is strong, these findings primarily come from observational studies, which show an association, not a definitive cause-and-effect relationship. A large-scale randomized controlled trial is the gold standard for conclusive proof, but such trials are difficult to conduct for dementia due to the time and cost involved. Public Health Recommendation: The [place]. Centers for Disease Control and Prevention (CDC) already recommends the two-dose Shingrix  vaccine for all healthy adults aged 35 and older primarily to prevent shingles and its painful complications. The potential added benefit for dementia prevention provides another compelling reason to get vaccinated.   "

## 2024-05-29 NOTE — Assessment & Plan Note (Signed)
 Unable to to tolerate CPAP ?get a mouthguard or consider Inspire
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
## 2024-05-29 NOTE — Assessment & Plan Note (Signed)
 Better Avoid alcohol Treat OSA: Unable to to tolerate CPAP ?get a mouthguard or consider Inspire

## 2024-05-29 NOTE — Assessment & Plan Note (Signed)
 Cont on Lipitor

## 2024-06-04 ENCOUNTER — Other Ambulatory Visit: Payer: Self-pay | Admitting: Internal Medicine
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
## 2024-06-07 ENCOUNTER — Telehealth: Payer: Self-pay
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
## 2024-06-08 NOTE — Telephone Encounter (Signed)
 It has been done on 06/07/2024.  Thanks

## 2024-06-10 ENCOUNTER — Telehealth: Payer: Self-pay

## 2024-06-10 ENCOUNTER — Other Ambulatory Visit (HOSPITAL_COMMUNITY): Payer: Self-pay

## 2024-06-10 NOTE — Telephone Encounter (Signed)
 Ozempic Christian [name] is approved exclusively as an adjunct to diet and exercise to improve glycemic control in adults with type 2 diabetes mellitus. A review of patient's medical chart reveals no documented diagnosis of type 2 diabetes or an A1C indicative of diabetes. Therefore, they do not currently meet the criteria for prior authorization of this medication. If clinically appropriate, alternative  options such as Saxenda, Zepbound, or Wegovy  may be considered for this patient.

## 2024-06-10 NOTE — Telephone Encounter (Signed)
 Pharmacy Patient Advocate Encounter   Received notification from Physician's Office that prior authorization for Mounjaro  12.5MG /0.5ML auto-injectors  is required/requested.   Insurance verification completed.   The patient is insured through [employer].   Per test claim: PA required; PA submitted to above mentioned insurance via Latent Key/confirmation #/EOC [id number] Status is pending

## 2024-06-11 ENCOUNTER — Other Ambulatory Visit (HOSPITAL_COMMUNITY): Payer: Self-pay

## 2024-06-12 ENCOUNTER — Ambulatory Visit: Admitting: Cardiovascular Disease

## 2024-06-20 ENCOUNTER — Institutional Professional Consult (permissible substitution) (INDEPENDENT_AMBULATORY_CARE_PROVIDER_SITE_OTHER): Admitting: Otolaryngology
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
## 2024-07-05 ENCOUNTER — Ambulatory Visit: Admitting: Cardiovascular Disease

## 2024-08-27 ENCOUNTER — Ambulatory Visit: Admitting: Internal Medicine

## 2025-01-27 ENCOUNTER — Other Ambulatory Visit: Admitting: Urology
